# Patient Record
Sex: Male | Born: 1935 | Race: White | Hispanic: No | Marital: Married | Smoking: Former smoker
Health system: Southern US, Community
[De-identification: ages and names within clinical notes are randomized; demographics above are authoritative.]

## PROBLEM LIST (undated history)

## (undated) DIAGNOSIS — Z973 Presence of spectacles and contact lenses: Secondary | ICD-10-CM

## (undated) DIAGNOSIS — Z86010 Personal history of colonic polyps: Secondary | ICD-10-CM

## (undated) DIAGNOSIS — D62 Acute posthemorrhagic anemia: Secondary | ICD-10-CM

## (undated) DIAGNOSIS — N319 Neuromuscular dysfunction of bladder, unspecified: Secondary | ICD-10-CM

## (undated) DIAGNOSIS — Z8546 Personal history of malignant neoplasm of prostate: Secondary | ICD-10-CM

## (undated) DIAGNOSIS — Z87898 Personal history of other specified conditions: Secondary | ICD-10-CM

## (undated) DIAGNOSIS — N320 Bladder-neck obstruction: Secondary | ICD-10-CM

## (undated) DIAGNOSIS — Z8719 Personal history of other diseases of the digestive system: Secondary | ICD-10-CM

## (undated) DIAGNOSIS — K5730 Diverticulosis of large intestine without perforation or abscess without bleeding: Secondary | ICD-10-CM

## (undated) DIAGNOSIS — R310 Gross hematuria: Secondary | ICD-10-CM

## (undated) DIAGNOSIS — Z8673 Personal history of transient ischemic attack (TIA), and cerebral infarction without residual deficits: Secondary | ICD-10-CM

## (undated) DIAGNOSIS — Z960 Presence of urogenital implants: Secondary | ICD-10-CM

## (undated) DIAGNOSIS — Z789 Other specified health status: Secondary | ICD-10-CM

## (undated) DIAGNOSIS — Z87440 Personal history of urinary (tract) infections: Secondary | ICD-10-CM

## (undated) DIAGNOSIS — R339 Retention of urine, unspecified: Secondary | ICD-10-CM

## (undated) DIAGNOSIS — Z978 Presence of other specified devices: Secondary | ICD-10-CM

## (undated) HISTORY — PX: EYE SURGERY: SHX253

## (undated) HISTORY — PX: PROSTATE SURGERY: SHX751

## (undated) HISTORY — PX: OTHER SURGICAL HISTORY: SHX169

---

## 1999-04-22 ENCOUNTER — Other Ambulatory Visit: Admission: RE | Admit: 1999-04-22 | Discharge: 1999-04-22 | Payer: Self-pay | Admitting: Urology

## 2001-08-31 ENCOUNTER — Encounter: Payer: Self-pay | Admitting: Urology

## 2001-09-06 ENCOUNTER — Encounter (INDEPENDENT_AMBULATORY_CARE_PROVIDER_SITE_OTHER): Payer: Self-pay

## 2001-09-06 ENCOUNTER — Inpatient Hospital Stay (HOSPITAL_COMMUNITY): Admission: RE | Admit: 2001-09-06 | Discharge: 2001-09-08 | Payer: Self-pay | Admitting: Urology

## 2001-09-06 HISTORY — PX: TRANSURETHRAL RESECTION OF PROSTATE: SHX73

## 2001-09-07 ENCOUNTER — Encounter: Payer: Self-pay | Admitting: Urology

## 2001-09-26 ENCOUNTER — Ambulatory Visit: Admission: RE | Admit: 2001-09-26 | Discharge: 2001-12-25 | Payer: Self-pay | Admitting: Radiation Oncology

## 2001-11-09 DIAGNOSIS — Z8546 Personal history of malignant neoplasm of prostate: Secondary | ICD-10-CM

## 2001-11-09 HISTORY — DX: Personal history of malignant neoplasm of prostate: Z85.46

## 2001-12-28 ENCOUNTER — Ambulatory Visit: Admission: RE | Admit: 2001-12-28 | Discharge: 2002-03-28 | Payer: Self-pay | Admitting: Radiation Oncology

## 2002-03-14 ENCOUNTER — Encounter: Payer: Self-pay | Admitting: Urology

## 2002-03-21 ENCOUNTER — Encounter (INDEPENDENT_AMBULATORY_CARE_PROVIDER_SITE_OTHER): Payer: Self-pay

## 2002-03-21 ENCOUNTER — Inpatient Hospital Stay (HOSPITAL_COMMUNITY): Admission: RE | Admit: 2002-03-21 | Discharge: 2002-03-25 | Payer: Self-pay | Admitting: Urology

## 2002-06-02 ENCOUNTER — Ambulatory Visit (HOSPITAL_BASED_OUTPATIENT_CLINIC_OR_DEPARTMENT_OTHER): Admission: RE | Admit: 2002-06-02 | Discharge: 2002-06-02 | Payer: Self-pay | Admitting: Urology

## 2002-06-02 HISTORY — PX: OTHER SURGICAL HISTORY: SHX169

## 2003-04-13 ENCOUNTER — Ambulatory Visit (HOSPITAL_COMMUNITY): Admission: RE | Admit: 2003-04-13 | Discharge: 2003-04-13 | Payer: Self-pay | Admitting: Gastroenterology

## 2003-08-04 ENCOUNTER — Encounter: Payer: Self-pay | Admitting: Emergency Medicine

## 2003-08-04 ENCOUNTER — Emergency Department (HOSPITAL_COMMUNITY): Admission: EM | Admit: 2003-08-04 | Discharge: 2003-08-04 | Payer: Self-pay | Admitting: Emergency Medicine

## 2004-11-06 ENCOUNTER — Ambulatory Visit (HOSPITAL_COMMUNITY): Admission: RE | Admit: 2004-11-06 | Discharge: 2004-11-06 | Payer: Self-pay | Admitting: Allergy

## 2007-03-11 ENCOUNTER — Encounter: Admission: RE | Admit: 2007-03-11 | Discharge: 2007-03-11 | Payer: Self-pay | Admitting: Gastroenterology

## 2008-09-07 ENCOUNTER — Encounter: Admission: RE | Admit: 2008-09-07 | Discharge: 2008-09-07 | Payer: Self-pay | Admitting: Internal Medicine

## 2008-11-09 HISTORY — PX: CATARACT EXTRACTION W/ INTRAOCULAR LENS  IMPLANT, BILATERAL: SHX1307

## 2011-03-27 NOTE — Discharge Summary (Signed)
[facility]  Patient:    Trevor Phillips, Trevor Phillips Visit Number: [id number] MRN: [id number]          Service Type: SUR Location: 3W 0348 01 Attending Physician:  Tania Ade Dictated by:   Lucrezia Starch. [name], M.D. Admit Date:  09/06/2001 Discharge Date: 09/08/2001   CC:         [name], M.D.   Discharge Summary  OPERATIVE PROCEDURE:  Cysto TURP on September 06, 2001.  HISTORY AND PHYSICAL EXAMINATION:  Mr. Mehra is a very nice 77 year old white male who basically is presented in urinary retention.  He has failed the voiding trials and has subsequently undergone urodynamics which revealed markedly delayed sensation with a low caliber detrusor contraction and significant outflow obstruction.  We had a long discussion and felt that he would have a chance urinating with cysto TURP.  He understands that he may fail this, but would like to certainly give it a shot.  He understands the risks, benefits, and alternatives, including, but not limited to bleeding, infection, scarring, etc., and has elected to proceed.  For the past medical history, social history, family history, and review of systems, please see history and physical for full details.  PHYSICAL EXAMINATION:  VITAL SIGNS:  He was afebrile, vital signs stable.  GENERAL:  He was well-nourished, well-developed, in no acute distress and oriented x3.  HEENT:  Normal.  NECK:  Without masses or thyromegaly.  CHEST:  Clear anterior and posterior without rales or rhonchi.  ABDOMEN:  Soft and nontender without masses or organomegaly.  EXTREMITIES:  Normal.  NEURO:  Intact.  PENIS:  Meatus, scrotum, testicles, anus, and perineum were normal.  Rectal vault was empty.  Prostate was approximately 25-30 g and appeared to benign to palpation.  HOSPITAL COURSE:  After undergoing a proper preoperative evaluation, which included full laboratory work, and which of note he had elevated  liver enzymes, but only mildly elevated liver enzymes and bilirubin.  He was subsequently taken to surgery and underwent cysto TURP, uneventfully. Postoperatively, immediately, that evening, his hematocrit was 37.8, hemoglobin 13.2, white blood cell count was 6.6, and Chem-7 was essentially normal for a glucose of 137.  I ask Dr. Vida Rigger to evaluate him due to the elevated liver enzymes and he felt they were very minimally elevated, possibly due to one of the drugs.  He felt that an ultrasound to rule out structural abnormalities were probably indicated.  By September 07, 2001, he was doing well.  Foley catheter was clear and he was to have the catheter out in the morning.  The ultrasound was essentially negative and hepatitis profiles, etc., were ordered and were to be followed up in Dr. Adriana Simas office.  He was subsequently discharged on September 08, 2001. He will have a catheter as needed.  He did begin urinating.  CONDITION ON DISCHARGE:  Improved.  MEDICATIONS:  Keflex.  FOLLOWUP:  He was to follow up in two weeks for evaluation.  Instructions were given.  Of note, his pathology report did reveal the stage IA adenocarcinoma, Gleason score 6.  This was discussed with him in some detail and will be followed up in the office. Dictated by:   Lucrezia Starch. [name], M.D. Attending Physician:  Tania Ade DD:  09/27/01 TD:  09/27/01 Job: [phone]

## 2011-03-27 NOTE — Consult Note (Signed)
[facility]  Patient:    Trevor, Phillips Visit Number: [id number] MRN: [id number]          Service Type: SUR Location: 3W 0348 01 Attending Physician:  Tania Ade Proc. Date: 09/06/01 Admit Date:  09/06/2001   CC:         Trevor Phillips, M.D.  [name], M.D.  [name], M.D.   Consultation Report  HISTORY OF PRESENT ILLNESS:  This patient is well-known to Dr. [name], [name], and Sherin Quarry and I am asked to see him by Dr. [name] for elevated liver tests.  He has no history of those and has no GI problems.  He has donated blood multiple times in the past including three months ago and has never gotten a letter back.  However, his preoperative and labs this morning were minimally elevated and I am consulted for further workup and plans.  He has seen Dr. Sherin Quarry before for colonoscopies and initially Dr. Sherin Quarry was contacted, but I was oncall for him today and asked to see the patient.  PAST MEDICAL HISTORY:  Pertinent for urinary retention and had a TURP today, but no other chronic medical problems except for allergies.  He does currently have a cold.  MEDICATIONS:  Antibiotics, both Cipro and Cephalexin.  Flomax and Allegra. He does take some vitamins and some health food store additives.  He drinks rarely and does not smoke.  FAMILY HISTORY:  Negative for any liver disease.  REVIEW OF SYSTEMS:  Pertinent for he is never noticed with a cold or virus having increased yellow eyes or dark urine.  He has not lost any weight or had any other complaints or problems.  PHYSICAL EXAMINATION:  GENERAL:  No acute distress.  Looks well postoperatively.  VITAL SIGNS:  Stable, afebrile.  HEENT:  Sclerae nonicteric.  NECK:  Supple without obvious adenopathy.  LUNGS:  Clear.  HEART:  Regular rate and rhythm.  ABDOMEN:  Soft and nontender.  Good bowel sounds.  No obvious organomegaly.  CBC normal.   Platelet count 168 with an MCV of 92.  Chemistries are pertinent for bilirubin of 2.0, but indirect of 1.9, alkaline phosphatase 69, SGOT 70, SGPT 101, albumin 3.2, total protein 6.8, PT 14.7, PTT 28.  ASSESSMENT:  Very minimally elevated liver tests with indirect bilirubin mostly, possibly all secondary to his recent cold and virus, possibly does have mild ______ as well.  Possibly one of the drugs is elevating his liver tests a little.  With his history of donating blood and the last one being three months ago without any letters getting back, you can probably rule out any chronic liver disease based on that history.  PLAN:  I will go ahead and get an ultrasound to rule out any structural abnormalities.  I believe the patient can be worked up as an outpatient unless liver tests increase significantly or unless significant finding on the ultrasound.  Either myself or Dr. Sherin Quarry will see tomorrow.  We can check Dr. Lesia Sago chart to see if we can compare old liver tests and certainly they could follow this since they are familiar with the patient, but I would be happy to assist in any way.  Reassurance was given to the patient and the family.  Possibly we should just stop his multivitamins and herbs and see how the labs do.  Thank you very much for the consultation. Attending Physician:  Tania Ade DD:  09/06/01  TD:  09/07/01 Job: [id number]

## 2011-03-27 NOTE — Op Note (Signed)
   NAME:  Trevor Phillips, Trevor Phillips                          ACCOUNT NO.:  [id number]   MEDICAL RECORD NO.:  [id number]                   PATIENT TYPE:  AMB   LOCATION:  ENDO                                 FACILITY:  [facility]   PHYSICIAN:  Danise Edge, M.D.                DATE OF BIRTH:  03-Jul-1931   DATE OF PROCEDURE:  04/13/2003  DATE OF DISCHARGE:                                 OPERATIVE REPORT   PROCEDURE PERFORMED:  Screening colonoscopy.   ENDOSCOPIST:  Danise Edge, M.D.   PROCEDURE INDICATION:  Trevor Phillips is a 77 year old male born November 20, 1930.  Trevor Phillips is scheduled to undergo his third screening colonoscopy  with polypectomy to prevent colon cancer.  He has undergone colonoscopic  exams in the past to remove neoplastic, but noncancerous colon polyps.   PREMEDICATION:  Versed 5 mg and Demerol 50 mg.   DESCRIPTION OF PROCEDURE:  After obtaining informed consent Trevor Phillips was  placed in the left lateral decubitus position.  I administered intravenous  Demerol and intravenous Versed to achieve conscious sedation for the  procedure.  The patient's blood pressure, oxygen saturation and cardiac  rhythm were monitored throughout the procedure, and documented in the  medical record.   Anal inspection was normal.  Digital rectal exam revealed an absent prostate  from prostate cancer therapy.   The Olympus pediatric adjustable colonoscope was introduced into the rectum  and easily advanced to the cecum.  A normal-appearing ileocecal valve was  intubated and the distal ileum inspected.  Colonic preparation for the  examine today was excellent.   Rectum:  In the distal rectum a 1 mm diminutive polyp was removed with the  electrocautery snare, but no pathology was retrieved.   Sigmoid Colon and Descending Colon:  Left colonic diverticulosis.   Splenic Flexure:  Normal.   Transverse Colon:  Normal.   Hepatic Flexure:  Normal.   Ascending Colon:  Normal.   Cecum and  Ileocecal Valve:  Normal.   Distal Ileum:  Normal.   ASSESSMENT:  1. Left colonic diverticulosis.  2. A diminutive polyp was removed from the distal rectum with electrocautery     snare, but pathology was not retrieved.   RECOMMENDATIONS:  Repeat colonoscopy in five years.                                                 Danise Edge, M.D.    MJ/MEDQ  D:  04/13/2003  T:  04/14/2003  Job:  [id number]   cc:   [name], M.D.  [address] [place], [place] [address]  Fax: [phone]

## 2011-03-27 NOTE — Op Note (Signed)
[facility]  Patient:    Trevor Phillips, SCHOLZ Visit Number: [id number] MRN: [id number]          Service Type: NES Location: NESC Attending Physician:  Tania Ade Dictated by:   Lucrezia Starch. [name], M.D. Proc. 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  OPERATION PERFORMED:  Cysto, dilatation of bladder neck contracture with Heymann dilators and placement of Foley catheter.  SURGEON:  Lucrezia Starch. [name], M.D.  ANESTHESIA:  General laryngeal airways.  ESTIMATED BLOOD LOSS:  Negligible.  TUBES:  20 [place] coude catheter.  COMPLICATIONS:  None.  INDICATIONS FOR PROCEDURE:  [name] is a very nice 77 year old white male who is status post radical retropubic prostatectomy in May of 2003. He had a small rectal injury which was repaired. He was noted also to have a very hypocontractile bladder and also he has been urinating well his urine flow has slowed down. We were afraid with the hypocontractile bladder there has been narrowing of the bladder neck. Due to the previous rectal injury which now appears to be healed, we were reluctant to manipulate it in the office. After understanding the risks, benefits, and alternatives, we elected to proceed with the above procedure.  DESCRIPTION OF PROCEDURE:  The patient was placed in supine position after proper general laryngeal airway anesthesia was placed in the dorsal lithotomy position and prepped and draped with Betadine in a sterile fashion. Cystoscopy was performed with a 17 [place] panendoscope. The external sphincter was noted to be intact but just proximal to that it was slightly narrow and under direct vision a 0.038 French Teflon coated guidewire was  placed into the bladder. The cystoscope was removed and utilizing Heymann dilators over the guidewire, the bladder neck was dilated with eased 22 [place] and the wire was maintained. Cystourethroscopy was then performed, the bladder neck was noted to be widely patent at this point. Efflux of clear urine was noted bilaterally and the bladder was noted to be intact although it was quite distended and thin walled hypocontractile. The bladder was drained, the panendoscope was removed. A 20 French 10 cc balloon coude catheter was passed and the bladder was drained. The guidewire was removed and the patient was taken to the recovery room stable. The plan is maintain the catheter for approximately two weeks and then teach him intermittent catheterization at that point. Dictated by:   Lucrezia Starch. [name], M.D. Attending Physician:  Tania Ade DD:  06/02/02 TD:  06/06/02 Job: [phone] [id number]

## 2011-03-27 NOTE — H&P (Signed)
[facility]  Patient:    Trevor Phillips, Trevor Phillips Visit Number: [id number] MRN: [id number]          Service Type: SUR Attending Physician:  Esaw Dace Md Dictated by:   Lucrezia Starch. [name], M.D. Admit Date:  03/21/2002 Discharge Date: 03/25/2002                           History and Physical  CHIEF COMPLAINT:  "I have prostate cancer."  HISTORY OF PRESENT ILLNESS:  [name] is a very nice 77 year old white male who initially developed urinary retention and failed multiple voiding trials and urodynamics revealed marked delayed sensation and low caliber detrusor contractions and elected to undergo cystoscopy and transurethral resectionof the prostate on September 06, 2002.  Pathology revealed Gleason score 6which is possibly A1 adenocarcinoma of the prostate.  Subsequent transrectal ultrasound and biopsy of the prostate revealed a Gleason score 7 which was 4+3 adenocarcinoma involving 10% of the tissue from the left side of the prostate. He has considered all options carefully including reading Dr. Laurell Roof book and has elected to proceed with radical retropubic prostatectomy.  ALLERGIES:  No known drug allergies.  MEDICATIONS: Allegra, Flomax, multivitamins.  PAST MEDICAL HISTORY:  Illnesses:  He has undergone TUNA, TURP, vasectomy, right eye surgery years ago, but otherwise essentially negative.  SOCIAL HISTORY:  He has an occasionaldrink.  He smoked one pack per day cigarettes for eight years and quit in 1958.  FAMILY HISTORY:  Noncontributory.  REVIEW OF SYSTEMS: He has no shortness of breath, dyspnea on exertion, GI complaints.  He does occasionally have hesitancy of urination.  PHYSICAL EXAMINATION  VITAL SIGNS:  Blood pressure 110/58, pulse 68, respirations 16, temperature 96.6 degrees Fahrenheit.  GENERAL:  He is well-nourished, well-developed, thin, in no acute distress. Oriented x3.  HEENT: PERRLA.  Nose, throat clear.  NECK:   Without mass or thyromegaly.  CHEST:  Clear anterior and posterior without rales or rhonchi.  HEART:  Normal sinus rhythm without murmurs or gallops.  ABDOMEN:  Soft, nontender.  No mass or organomegaly.  EXTREMITIES:  Normal.  NEUROLOGIC:  Intact.  SKIN:  Normal.  GENITOURINARY:  Penis, meatus, scrotum, testicle, adnexa, anus, and perineum are normal.  RECTAL:  Rectal vault is empty.  Prostate is approximately 20-25 g, smooth, and firm.  IMPRESSION:  Prostate cancer.  PLAN:  Radical retropubic prostatectomy. Dictated by:   Lucrezia Starch. [name], M.D. Attending Physician:  Esaw Dace Md DD:  04/19/02 TD:  04/21/02 Job: [id number]

## 2011-03-27 NOTE — Op Note (Signed)
[facility]  Patient:    Trevor Phillips, Trevor Phillips Visit Number: [id number] MRN: [id number]          Service Type: SUR Location: 3W 0348 01 Attending Physician:  Tania Ade Dictated by:   Lucrezia Starch. [name], M.D. Proc. Date: 09/06/01 Admit Date:  09/06/2001                             Operative Report  PREOPERATIVE DIAGNOSIS:  Urinary retention, benign prostatic hypertrophy.  POSTOPERATIVE DIAGNOSIS:  Urinary retention, benign prostatic hypertrophy.  OPERATION PERFORMED:  Cystoscopy and transurethral resection of the prostate.  SURGEON:  Lucrezia Starch. [name], M.D.  ANESTHESIA:  Spinal.  ESTIMATED BLOOD LOSS:  75 cc.  DRAINS:  24 French 30 cc balloon Ainsworth Foley catheter.  COMPLICATIONS:  None.  INDICATIONS FOR PROCEDURE:  Mr. Mcfarren is a very nice 77 year old white male who has developed a urinary retention.  He has been on self intermittent catheterization and has undergone multiple voiding trials despite being on anticholinergics has failed.  He has undergone full urodynamics which revealed a markedly delayed sensation of contraction and low caliber detrusor contraction with outflow obstruction. We have considered all options of minimally invasive procedures, continue intermittent catheterization, tube drainage, etc. and after understanding risks, benefits and alternatives, he has elected to proceed with transurethral resection of the prostate.  He knows he very well may not urinate after this but this will increase his chances of urinating.  He knows he may have to continue intermittent catheterization.  He understands bleeding, infection, retrograde ejaculation, sexual dysfunction, etc. and wishes to proceed.  DESCRIPTION OF PROCEDURE:  The patient was placed in supine position after proper spinal anesthesia, he was placed in dorsal lithotomy position and prepped and draped with Betadine in sterile fashion.  The urethra  was calibrated to 30 [place] with [employer] sounds and a 28 French Olympus continuous flow resectoscope sheath was placed over a Chartered loss adjuster and the bladder was carefully inspected.  It was noted to be smooth-walled. Efflux of clear urine was noted from normally placed ureteral orifices bilaterally and there was a bilobar hypertrophy with a narrow but obstructed prostate.  I utilized the working element of the 12 degree lens. Transurethral resection of the prostate was begun at the bladder neck and extended to the verumontanum.  Resection was carried down to the surgical capsule.  The left lobe of the prostate was then serially resected to the surgical capsule as was the right lobe.  Apical tissue was then approached and resected 360 degrees carefully avoid verumontanum and external sphincter muscle.  The overhanging tissue in the 12 oclock position was serially resected again to the surgical capsule.  Reinspection revealed no significant perforations were noted.  There was no remaining adenoma.  Chips were evacuated with Toomey syringe and reinspection revealed that there were no chips within the bladder.  Good hemostasis was noted to be present.  The resectoscope sheath was visually removed and a 24 French 30 cc balloon Ainsworth catheter was passed into the bladder and the bladder was noted to irrigate clear.  Chips were submitted to pathology.  The patient tolerated the procedure well and was taken to the recovery room in stable condition. Dictated by:   Lucrezia Starch. [name], M.D. Attending Physician:  Tania Ade DD:  09/06/01 TD:  09/06/01 Job: [id number]

## 2011-03-27 NOTE — Consult Note (Signed)
[facility]  Patient:    CANDLER, GINSBERG Visit Number: [id number] MRN: [id number]          Service Type: SUR Location: 3W 0352 01 Attending Physician:  Tania Ade Dictated by:   Adolph Pollack, M.D. Admit Date:  03/21/2002   CC:         [name], M.D.   Consultation Report  REQUESTING PHYSICIAN:  Lucrezia Starch. [name], M.D.  REASON:  Rectal perforation.  HISTORY OF PRESENT ILLNESS:  Mr. Cowin is a 77 year old man with prostate cancer.  He has had previous prostate biopsies as well as a TURP.  He was undergoing a radical prostatectomy an in separating the prostate gland from the rectum a small perforation was made in the anterior aspect of the rectum. I was asked to come inspect the area intraoperatively.  Examining the area demonstrate a 1-1.5 cm perforation in the anterior aspect of the rectum just to the left.  The edges are clean and viable.  No gross contamination is noted (patient had a mechanical bowel prep preoperatively ordered by Dr. [name]).  IMPRESSION:  Rectal perforation.  PLAN: 1. Primary closure.  I do not think diverting colostomy is necessary as the    edges are viable and there is no obvious gross contamination.  There is    also minimal blood loss. 2. Seventy-two hours of intravenous antibiotics. 3. Drain area. 4. No rectal medicines or temperatures. Dictated by:   Adolph Pollack, M.D. Attending Physician:  Tania Ade DD:  03/21/02 TD:  03/24/02 Job: [id number]

## 2011-03-27 NOTE — Op Note (Signed)
[facility]  Patient:    Trevor Phillips, Trevor Phillips Visit Number: [id number] MRN: [id number]          Service Type: SUR Location: 3W 0352 01 Attending Physician:  Tania Ade Dictated by:   Lucrezia Starch. [name], M.D. Proc. Date: 03/21/02 Admit Date:  03/21/2002                             Operative Report  9PREOPERATIVE DIAGNOSIS:  Adenocarcinoma of the prostate.  POSTOPERATIVE DIAGNOSIS:  Adenocarcinoma of the prostate.  OPERATION PERFORMED:  Radical retropubic prostatectomy.  SURGEON:  Lucrezia Starch. [name], M.D.  ASSISTANT:  Excell Seltzer. Annabell Howells, M.D.  ANESTHESIA:  General endotracheal.  ESTIMATED BLOOD LOSS:  125 cc.  TUBES:  22 [place] Foley, large flat Blake drain.  COMPLICATIONS:  A small rectal injury was encountered and repaired primarily by Dr. Avel Peace with Dr. Annabell Howells assisting.  DISPOSITION:  To recovery room stable.  INDICATIONS FOR PROCEDURE:  Trevor Phillips is a very nice 77 year old white male who initially developed urinary retention and failed multiple voiding trials. He underwent urodynamics which revealed markedly delayed sensation and low caliber contusion contractions and elected to undergo Cysto TURP on September 06, 2001. The pathology revealed a Gleason score of 6 possible stage A1 adenocarcinoma of the prostate. Subsequent transrectal ultrasound and biopsy of the prostate revealed, however, a Gleason score of 7 which was 4 + 3 adenocarcinoma involving 10% of the tissue from the left side of the prostate. He has considered all options very carefully and has read Dr. Laurine Blazer books, spent multiple sessions with me and with his family and has also had a second opinion with Dr. Kathrin Greathouse a radiation oncologist. He realizes he may not urinate well and he also realizes that having had the TURP and the biopsy, the rectal injury rate has increased, incontinence and impotence rate is increased. We discussed all of this in great  detail and after understanding the risks, benefits, and alternatives and being very well informed, he has elected to proceed with radical retropubic prostatectomy.  DESCRIPTION OF PROCEDURE:  The patient was placed in the supine position after proper general endotracheal anesthesia was prepped and draped with Betadine in a sterile fashion. He was initially catheterized and noted to have 1300 cc in his bladder. He does catheterize himself every few days and he has a known hypocontractile bladder. Understanding that he may not urinate, he still wanted to proceed with radical retropubic prostatectomy. A lower midline vertical abdominal incision was made, sharp dissection carried down to the subcutaneous tissue. The rectus muscle bellies were retracted laterally and the space of Retzius was entered. A Bookwalter retractor was placed and bilateral pelvic lymphadenectomy was performed. The obturator, hypogastric and external iliac lymph nodes were dissected free and submitted to pathology. External lymph nodes were avoided. On the distal margin of the resection was the circumflex iliac vein and on the proximal margin was the ureter. Next, the prostate was approached and was noted to be quite small and somewhat adherent from prior TURP. The endopelvic fascia was perforated bilaterally. The lateral pelvic fascia was taken down bilaterally. The puboprostatic ligaments were sharply incised. The superficial dorsal vein to the penis was ligated proximally and distally with 3-0 chromic catgut and the deep dorsal vein in the penis was encircled with a Hohenfellner retractor and ligated with #1 Vicryl suture. It was cut sharply with Bovie coagulation cautery. The urethra  was identified. The pillars of the prostate were taken down laterally. The urethra was then encircled with a right angle and an umbilical tape was placed. The anterior urethra was incised sharply with the knife. The catheter was  delivered into the operative field and the posterior urethra was incised sharply with scissors to the umbilical tape. The rectourethralis muscle was noted to be highly attenuated, densely adherent and the rectum was thin and densely adherent to the posterior prostate. Denonvilliers fascia was not well defined and there was a positive margin in this region. The rectum was then dissected to the transversalis fascia and while tension was being held on the prostate, a small rent appeared laterally in the rectum and was approximately 1 cm in length. There was absolutely no contamination noted. He had had an excellent mechanical bowel prep preoperatively and Dr. Avel Peace was asked in at that point. It was felt that dissection should be performed before the repair was performed, therefore, the lateral pedicles were taken and clipped with hemlock clips and the seminal vesicle and ampulla vas deferens were dissected down, clipped and incised with the specimen. At that point, Dr. Abbey Chatters entered the operating room and repaired the rectum and it will be dictated separately. He repaired it in three layers, had excellent closure and coverage and he felt due to essentially no contamination, a very small injury, well vascularized tissues, again no contamination that temporary colostomy was probably not necessary. An ampule of indigo carmine was given IV. The prostate was incised at the bladder neck area. A large TUR defect was noted. This was taken down posteriorly and the specimen was removed and submitted to pathology. Thorough was irrigation was performed and good hemostasis noted to be present. The mucosa of the bladder was approximated to the serosa with interrupted 5-0 chromic catgut and a tennis racquet type closure was performed with 3-0 chromic catgut followed by secondary layers covering the suture line with well vascularized posterior tissue creating a second layer over the bladder  suture line. A good bladder neck that was well mucosalized was noted to be present and efflux of blue urine was noted from the ureteral orifices.  The urethral anastomosis was performed with a 22 French 10 cc balloon catheter with 15 cc in the balloon. Stitches were placed in the 5 oclock, 7 oclock, 3 oclock, 9 oclock and 12 oclock positions and were strictly stitched to the urethra well away from the rectal repair. Again multiple layers of vascular tissues were noted between the urethral anastomosis and the rectal repair and the rectal repair was well away from the anastomosis. A #1 Prolene safety stitch was attached to the islet, exited through the bladder and tied over a button. The bladder was irrigated thoroughly and there was absolutely no leakage. It was water tight. The rectum had been distended previously and there was absolutely no leakage and again we were comfortable that no further repair was necessary or further flaps and certainly not a temporary colostomy in this case. Thorough irrigation had been performed previously with antibiotic solution and again thorough irrigation was performed with multiple GU syringes of antibiotic solution. A large flat Blake drain was placed through a left stab incision and sutured in place with silk. The rectus muscle bellies were approximated in the midline with interrupted #0 chromic catgut. The fascia was closed with running #1 PDS suture, skin was closed with skin staples. The patient was taken to the recovery room stable. Dictated by:   Windy Fast  Arby Barrette, M.D. Attending Physician:  Tania Ade DD:  03/21/02 TD:  03/21/02 Job: [id number]

## 2011-03-27 NOTE — Op Note (Signed)
[facility]  Patient:    Trevor Phillips, Trevor Phillips Visit Number: [id number] MRN: [id number]          Service Type: SUR Location: 3W 0352 01 Attending Physician:  Tania Ade Dictated by:   Adolph Pollack, M.D. Proc. Date: 03/21/02 Admit Date:  03/21/2002   CC:         [name], M.D.   Operative Report  PREOPERATIVE DIAGNOSIS:  Prostate cancer.  POSTOPERATIVE DIAGNOSIS:  Prostate cancer with rectal perforation.  PROCEDURE:  Primary repair of rectal perforation.  SURGEON:  Adolph Pollack, M.D.  ASSISTANT:  Excell Seltzer. Annabell Howells, M.D.  ANESTHESIA:  General.  INDICATION:  This 77 year old male was undergoing radical retropubic prostatectomy.  Separating the prostate gland from the rectum resulted in a small rectal perforation.  The patient had a preoperative mechanical bowel prep.  On examining, I was subsequently called in the operating room to assess this.  TECHNIQUE:  In evaluating the area, there was a 1-1.5 cm perforation in the anterior aspect of the rectum to the left side.  The edges were clean and viable.  No gross contamination was noted.  The prostate had been retracted superiorly.  I subsequently closed the perforation initially with a running 3-0 Vicryl suture.  I then used 3-0 silk sutures to embrocate the area in a Lembert type fashion.  Following this, I closed peritoneum over the repair with a running 3-0 Vicryl suture.  I closed this in layers to try to minimize a future rectourethral fistula.  Next, the area was irrigated, and the repair was under no tension and appeared adequate.  I subsequently then performed a digital rectal examination.  The area proximal to the repair was occluded, and then air was insufflated into the rectum, and the pelvis filled up with saline.  No evidence of leak was noted.  The plan will be to keep him on 72 hours of IV antibiotics.  Avoid rectal temperatures or medications.  We will  start out with a liquid diet, start a stool softener tomorrow. Dictated by:   Adolph Pollack, M.D. Attending Physician:  Tania Ade DD:  03/21/02 TD:  03/22/02 Job: [id number]

## 2011-03-27 NOTE — Discharge Summary (Signed)
[facility]  Patient:    Trevor Phillips, Trevor Phillips Visit Number: [id number] MRN: [id number]          Service Type: SUR Attending Physician:  Esaw Dace Md Dictated by:   Lucrezia Starch. [name], M.D. Admit Date:  03/21/2002 Discharge Date: 03/25/2002                             Discharge Summary  DIAGNOSIS:  Adenocarcinoma of the prostate.  OPERATIVE PROCEDURE:  Radical retropubic prostatectomy on Mar 21, 2002 and primary repair of rectum on Mar 21, 2002 by Dr. Avel Peace.  HISTORY OF PRESENT ILLNESS:  The patient is a very nice 77 year old white male who developed urinary retention, failed voiding trials, and underwent TURP. He was found to have a small focus of low-grade adenocarcinoma of the prostate but on rebiopsy there was found to be a Gleason score VII which was 3+4 adenocarcinoma in 10% of the tissue from the left side of the prostate. After understanding risks, benefits, and alternatives he elected to undergo radical retropubic prostatectomy.  PAST MEDICAL HISTORY/SOCIAL HISTORY/FAMILY HISTORY/REVIEW OF SYSTEMS:  Please see history and physical examination for full details.  PHYSICAL EXAMINATION:  VITAL SIGNS:  He is afebrile;, vital signs stable.  GENERAL:  He is well nourished, well developed, thin, in no acute distress, oriented x3.  HEAD/EYES/EARS/NOSE AND THROAT:  PERRLA.  Ears, nose and throat clear.  NECK:  Without masses or thyromegaly.  CHEST:  Clear anterior and posterior without rales or rhonchi.  ABDOMEN:  Soft, nontender,without mass or organomegaly.  EXTREMITIES:  Normal.  NEUROLOGIC:  Intact.  SKIN:  Normal.  GU:  Penis, meatus, scrotum, testicle, adnexa, anus, and perineum are normal.  RECTAL:  Prostateis 20 g, firm, and dormant.  HOSPITAL COURSE:  The patient was admitted and after undergoing proper preoperative evaluation was subsequently taken to surgery on Mar 21, 2002.  In surgery there was a  small clean rectal injury and Dr. Avel Peace was asked at surgery and repaired it primarily.  Immediately postoperatively he was doing well and by postop day#1, Mar 22, 2002, he was afebrile, hemoglobin was 11.0, hematocrit 31.8,white blood cell count was 9.9, and BMET was essentially normal and the Turner output was essentially low.  By postop day #2 his T-max was 99 degrees Fahrenheit, he was ambulatory, the [facility] output was low, the morphinePCA was discontinued and full liquid diet as tolerated was advanced withthe good graces of Dr. Abbey Chatters.  By Mar 24, 2002, he continued to do well, he tolerated a diet, urine was clear, flatus and bowel movements were noted and he was subsequently discharged on Mar 25, 2002 with the catheter in place, the drain was removed by Dr. Abbey Chatters, the wound was clear.  He was to follow up with me the following week for staple removal and catheter management.  DISCHARGE CONDITION:  Improved.  DISCHARGE INSTRUCTIONS:  Instructions were given.  FINAL PATHOLOGY:  A Gleason score VII which was 3+4 adenocarcinoma clinical PT2C, PN0, PMX.  This was discussed and understood. Dictated by:   Lucrezia Starch. [name], M.D. Attending Physician:  Esaw Dace Md DD:  04/19/02 TD:  04/21/02 Job: [id number]

## 2011-09-10 ENCOUNTER — Other Ambulatory Visit: Payer: Self-pay | Admitting: Dermatology

## 2012-05-18 ENCOUNTER — Emergency Department (HOSPITAL_BASED_OUTPATIENT_CLINIC_OR_DEPARTMENT_OTHER): Payer: No Typology Code available for payment source

## 2012-05-18 ENCOUNTER — Encounter (HOSPITAL_BASED_OUTPATIENT_CLINIC_OR_DEPARTMENT_OTHER): Payer: Self-pay | Admitting: Emergency Medicine

## 2012-05-18 ENCOUNTER — Emergency Department (HOSPITAL_BASED_OUTPATIENT_CLINIC_OR_DEPARTMENT_OTHER)
Admission: EM | Admit: 2012-05-18 | Discharge: 2012-05-18 | Disposition: A | Discharge status: Home / Self Care | Payer: No Typology Code available for payment source | Attending: Emergency Medicine | Admitting: Emergency Medicine

## 2012-05-18 DIAGNOSIS — Z8546 Personal history of malignant neoplasm of prostate: Secondary | ICD-10-CM | POA: Insufficient documentation

## 2012-05-18 DIAGNOSIS — S50812A Abrasion of left forearm, initial encounter: Secondary | ICD-10-CM

## 2012-05-18 DIAGNOSIS — M542 Cervicalgia: Secondary | ICD-10-CM | POA: Insufficient documentation

## 2012-05-18 DIAGNOSIS — M25579 Pain in unspecified ankle and joints of unspecified foot: Secondary | ICD-10-CM | POA: Insufficient documentation

## 2012-05-18 DIAGNOSIS — IMO0002 Reserved for concepts with insufficient information to code with codable children: Secondary | ICD-10-CM | POA: Insufficient documentation

## 2012-05-18 DIAGNOSIS — R079 Chest pain, unspecified: Secondary | ICD-10-CM | POA: Insufficient documentation

## 2012-05-18 DIAGNOSIS — R51 Headache: Secondary | ICD-10-CM | POA: Insufficient documentation

## 2012-05-18 DIAGNOSIS — S82402A Unspecified fracture of shaft of left fibula, initial encounter for closed fracture: Secondary | ICD-10-CM

## 2012-05-18 MED ORDER — HYDROCODONE-ACETAMINOPHEN 5-325 MG PO TABS: 1.0000 | ORAL_TABLET | ORAL | Status: AC | PRN
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
## 2012-05-18 NOTE — ED Notes (Signed)
EDPA back in to talk with pt r/t results and f/u

## 2012-05-18 NOTE — ED Notes (Signed)
Bruising now noted to right forehead-pt denies pain to area/nontender to touch-denies LOC

## 2012-05-20 NOTE — ED Provider Notes (Signed)
Medical screening examination/treatment/procedure(s) were conducted as a shared visit with non-physician practitioner(s) and myself.  I personally evaluated the patient during the encounter   Rolan Bucco, MD 05/20/12 [phone]

## 2012-05-31 ENCOUNTER — Other Ambulatory Visit: Payer: Self-pay | Admitting: Geriatric Medicine

## 2012-05-31 DIAGNOSIS — R9089 Other abnormal findings on diagnostic imaging of central nervous system: Secondary | ICD-10-CM

## 2012-06-02 ENCOUNTER — Ambulatory Visit
Admission: RE | Admit: 2012-06-02 | Discharge: 2012-06-02 | Disposition: A | Discharge status: Home / Self Care | Payer: Medicare Other | Source: Ambulatory Visit | Attending: Geriatric Medicine | Admitting: Geriatric Medicine

## 2012-06-02 DIAGNOSIS — R9089 Other abnormal findings on diagnostic imaging of central nervous system: Secondary | ICD-10-CM

## 2013-03-16 ENCOUNTER — Other Ambulatory Visit: Payer: Self-pay | Admitting: Dermatology

## 2013-09-06 ENCOUNTER — Other Ambulatory Visit: Payer: Self-pay | Admitting: Geriatric Medicine

## 2013-09-07 ENCOUNTER — Other Ambulatory Visit: Payer: Medicare Other

## 2013-09-08 ENCOUNTER — Ambulatory Visit
Admission: RE | Admit: 2013-09-08 | Discharge: 2013-09-08 | Disposition: A | Discharge status: Home / Self Care | Payer: 59 | Source: Ambulatory Visit | Attending: Geriatric Medicine | Admitting: Geriatric Medicine

## 2013-09-11 ENCOUNTER — Other Ambulatory Visit: Payer: Medicare Other

## 2013-10-17 ENCOUNTER — Encounter (HOSPITAL_COMMUNITY): Payer: Self-pay | Admitting: Emergency Medicine

## 2013-10-17 ENCOUNTER — Emergency Department (HOSPITAL_COMMUNITY): Payer: Medicare Other

## 2013-10-17 ENCOUNTER — Emergency Department (HOSPITAL_COMMUNITY)
Admission: EM | Admit: 2013-10-17 | Discharge: 2013-10-17 | Disposition: A | Discharge status: Home / Self Care | Payer: Medicare Other | Attending: Emergency Medicine | Admitting: Emergency Medicine

## 2013-10-17 DIAGNOSIS — K5792 Diverticulitis of intestine, part unspecified, without perforation or abscess without bleeding: Secondary | ICD-10-CM

## 2013-10-17 DIAGNOSIS — Z87448 Personal history of other diseases of urinary system: Secondary | ICD-10-CM | POA: Insufficient documentation

## 2013-10-17 DIAGNOSIS — R197 Diarrhea, unspecified: Secondary | ICD-10-CM | POA: Insufficient documentation

## 2013-10-17 DIAGNOSIS — Z87891 Personal history of nicotine dependence: Secondary | ICD-10-CM | POA: Insufficient documentation

## 2013-10-17 DIAGNOSIS — Z79899 Other long term (current) drug therapy: Secondary | ICD-10-CM | POA: Insufficient documentation

## 2013-10-17 DIAGNOSIS — Z8719 Personal history of other diseases of the digestive system: Secondary | ICD-10-CM | POA: Insufficient documentation

## 2013-10-17 DIAGNOSIS — Z87440 Personal history of urinary (tract) infections: Secondary | ICD-10-CM | POA: Insufficient documentation

## 2013-10-17 DIAGNOSIS — K5732 Diverticulitis of large intestine without perforation or abscess without bleeding: Secondary | ICD-10-CM | POA: Insufficient documentation

## 2013-10-17 DIAGNOSIS — Z8546 Personal history of malignant neoplasm of prostate: Secondary | ICD-10-CM | POA: Insufficient documentation

## 2013-10-17 DIAGNOSIS — Z7982 Long term (current) use of aspirin: Secondary | ICD-10-CM | POA: Insufficient documentation

## 2013-10-17 LAB — COMPREHENSIVE METABOLIC PANEL
ALT: 20 U/L (ref 0–53)
CO2: 24 mEq/L (ref 19–32)
Calcium: 9.4 mg/dL (ref 8.4–10.5)
Creatinine, Ser: 0.86 mg/dL (ref 0.50–1.35)
GFR calc Af Amer: >90 mL/min (ref >90)
GFR calc non Af Amer: 80 mL/min — ABNORMAL LOW (ref >90)
Glucose, Bld: 89 mg/dL (ref 70–99)
Sodium: 137 mEq/L (ref 135–145)

## 2013-10-17 LAB — CBC WITH DIFFERENTIAL/PLATELET
Basophils Absolute: 0.1 10*3/uL (ref 0.0–0.1)
Basophils Relative: 1 % (ref 0–1)
Hemoglobin: 12.5 g/dL — ABNORMAL LOW (ref 13.0–17.0)
MCHC: 33.9 g/dL (ref 30.0–36.0)
Monocytes Relative: 6 % (ref 3–12)
Neutro Abs: 9.2 10*3/uL — ABNORMAL HIGH (ref 1.7–7.7)
Neutrophils Relative %: 74 % (ref 43–77)

## 2013-10-17 LAB — URINE MICROSCOPIC-ADD ON

## 2013-10-17 LAB — URINALYSIS, ROUTINE W REFLEX MICROSCOPIC
Glucose, UA: NEGATIVE mg/dL
Nitrite: NEGATIVE
Protein, ur: NEGATIVE mg/dL
Urobilinogen, UA: 0.2 mg/dL (ref 0.0–1.0)

## 2013-10-17 LAB — LIPASE, BLOOD: Lipase: 29 U/L (ref 11–59)

## 2013-10-17 LAB — CG4 I-STAT (LACTIC ACID): Lactic Acid, Venous: 0.7 mmol/L (ref 0.5–2.2)

## 2013-10-17 MED ORDER — METRONIDAZOLE 500 MG PO TABS
500.0000 mg | ORAL_TABLET | Freq: Once | ORAL | Status: AC
  Administered 2013-10-17: 500 mg via ORAL
  Filled 2013-10-17: qty 1

## 2013-10-17 MED ORDER — CIPROFLOXACIN HCL 500 MG PO TABS
500.0000 mg | ORAL_TABLET | Freq: Once | ORAL | Status: AC
  Administered 2013-10-17: 500 mg via ORAL
  Filled 2013-10-17: qty 1

## 2013-10-17 MED ORDER — METRONIDAZOLE 500 MG PO TABS: 500.0000 mg | ORAL_TABLET | Freq: Three times a day (TID) | ORAL | Status: DC

## 2013-10-17 MED ORDER — IOHEXOL 300 MG/ML  SOLN
25.0000 mL | INTRAMUSCULAR | Status: DC
  Administered 2013-10-17: 25 mL via ORAL

## 2013-10-17 MED ORDER — CIPROFLOXACIN HCL 500 MG PO TABS: 500.0000 mg | ORAL_TABLET | Freq: Two times a day (BID) | ORAL | Status: DC

## 2013-10-17 MED ORDER — IOHEXOL 300 MG/ML  SOLN
80.0000 mL | Freq: Once | INTRAMUSCULAR | Status: AC | PRN
  Administered 2013-10-17: 80 mL via INTRAVENOUS

## 2013-10-17 NOTE — ED Notes (Signed)
Pt reports left lower abdominal pain since AM. States it has been a 6/10 but after 5 loose stools pt now reports pain 4/10. Reports loose BM x "several weeks"; pt currently being seen by GI specialist and started on new medication last week to firm stool with no success. Pain on palpation to LLQ. Denies N/V.

## 2013-10-17 NOTE — ED Notes (Signed)
NOTIFIED DR. GOLDSTON OF PATIENTS LAB RESULTS OF CG4 LACTIC ACID = 0.70 mmoI/L, @19 :20PM ,10/17/2013.

## 2013-10-17 NOTE — ED Notes (Signed)
Ct made aware that pt finished oral contrast

## 2013-10-17 NOTE — ED Notes (Addendum)
Has had left abd pain and some loose bms called his GI dr and he was told to come to er . States loose but normal looking  bm  Had small amt blood in undies he states

## 2013-10-17 NOTE — ED Provider Notes (Signed)
CSN: [id number]     Arrival date & time 10/17/13  1446 History   First MD Initiated Contact with Patient 10/17/13 1752     Chief Complaint  Patient presents with  . Abdominal Pain   (Consider location/radiation/quality/duration/timing/severity/associated sxs/prior Treatment) HPI Comments: 79 yo male with 1 day of left lower abd pain. Feels like a dull achy pain. No fevers, chills or vomiting. Has had diarrhea but that is not different from his baseline. No blood in his stool or melena. Has not tried anything for the pain. Pain is currently moderate at this time, has slowly improved throughout the day. Has not noticed any bulges. No urinary sx.   Past Medical History  Diagnosis Date  . UTI (urinary tract infection)   . Urinary retention with incomplete bladder emptying   . Cancer of prostate   . Diverticular disease    Past Surgical History  Procedure Laterality Date  . Prostate surgery     No family history on file. History  Substance Use Topics  . Smoking status: Former Games developer  . Smokeless tobacco: Former Neurosurgeon  . Alcohol Use: Yes     Comment: "very little"    Review of Systems  Constitutional: Negative for fever.  Cardiovascular: Negative for chest pain.  Gastrointestinal: Positive for abdominal pain and diarrhea. Negative for vomiting and blood in stool.  Genitourinary: Negative for dysuria.  All other systems reviewed and are negative.    Allergies  Review of patient's allergies indicates no known allergies.  Home Medications   Current Outpatient Rx  Name  Route  Sig  Dispense  Refill  . aspirin EC 81 MG tablet   Oral   Take 81 mg by mouth every morning.         Marland Kitchen b complex vitamins tablet   Oral   Take 1 tablet by mouth every morning.          . Biotin 5000 MCG CAPS   Oral   Take 5,000 mcg by mouth every evening.         . cetirizine (ZYRTEC) 10 MG tablet   Oral   Take 10 mg by mouth every morning.          . Cholecalciferol (VITAMIN D3) 2000  UNITS TABS   Oral   Take 2,000 Units by mouth daily.         Marland Kitchen co-enzyme Q-10 30 MG capsule   Oral   Take 30 mg by mouth every evening.         . ezetimibe (ZETIA) 10 MG tablet   Oral   Take 10 mg by mouth every evening.          Marland Kitchen glucosamine-chondroitin 500-400 MG tablet   Oral   Take 1 tablet by mouth every evening.         . methenamine (HIPREX) 1 G tablet   Oral   Take 0.5 g by mouth 2 (two) times daily with a meal.         . Multiple Vitamins-Minerals (MULTIVITAMIN WITH MINERALS) tablet   Oral   Take 1 tablet by mouth every morning.          . Omega-3 Fatty Acids (FISH OIL PO)   Oral   Take 1 capsule by mouth 2 (two) times daily.         . polyethylene glycol (MIRALAX / GLYCOLAX) packet   Oral   Take 17 g by mouth 2 (two) times daily.         Marland Kitchen  PRESCRIPTION MEDICATION   Oral   Take 0.5 tablets by mouth 2 (two) times daily. Pink pill for bladder         . Probiotic Product (PROBIOTIC DAILY PO)   Oral   Take 1 capsule by mouth 2 (two) times daily.         . Zinc 50 MG CAPS   Oral   Take 50 mg by mouth every evening.         . ciprofloxacin (CIPRO) 500 MG tablet   Oral   Take 1 tablet (500 mg total) by mouth 2 (two) times daily. One po bid x 14 days   28 tablet   0   . metroNIDAZOLE (FLAGYL) 500 MG tablet   Oral   Take 1 tablet (500 mg total) by mouth 3 (three) times daily. X 14 days   42 tablet   0    BP 174/83  Pulse 73  Temp(Src) 97.5 F (36.4 C) (Oral)  Resp 16  SpO2 98% Physical Exam  Nursing note and vitals reviewed. Constitutional: He is oriented to person, place, and time. He appears well-developed and well-nourished. No distress.  HENT:  Head: Normocephalic and atraumatic.  Right Ear: External ear normal.  Left Ear: External ear normal.  Nose: Nose normal.  Eyes: Right eye exhibits no discharge. Left eye exhibits no discharge.  Neck: Neck supple.  Cardiovascular: Normal rate, regular rhythm, normal heart sounds  and intact distal pulses.   Pulmonary/Chest: Effort normal.  Abdominal: Soft. There is tenderness in the left lower quadrant. Hernia confirmed negative in the right inguinal area and confirmed negative in the left inguinal area.  Genitourinary: Testes normal and penis normal. Right testis shows no tenderness. Left testis shows no tenderness.  Musculoskeletal: He exhibits no edema.  Neurological: He is alert and oriented to person, place, and time.  Skin: Skin is warm and dry.    ED Course  Procedures (including critical care time) Labs Review Labs Reviewed  COMPREHENSIVE METABOLIC PANEL - Abnormal; Notable for the following:    Albumin 3.3 (*)    GFR calc non Af Amer 80 (*)    All other components within normal limits  CBC WITH DIFFERENTIAL - Abnormal; Notable for the following:    WBC 12.5 (*)    RBC 3.98 (*)    Hemoglobin 12.5 (*)    HCT 36.9 (*)    Neutro Abs 9.2 (*)    All other components within normal limits  URINALYSIS, ROUTINE W REFLEX MICROSCOPIC - Abnormal; Notable for the following:    Hgb urine dipstick SMALL (*)    Ketones, ur 15 (*)    Leukocytes, UA TRACE (*)    All other components within normal limits  URINE MICROSCOPIC-ADD ON - Abnormal; Notable for the following:    Squamous Epithelial / LPF MANY (*)    All other components within normal limits  LIPASE, BLOOD  CG4 I-STAT (LACTIC ACID)   Imaging Review Ct Abdomen Pelvis W Contrast  10/17/2013   CLINICAL DATA:  Diarrhea, left lower quadrant pain  EXAM: CT ABDOMEN AND PELVIS WITH CONTRAST  TECHNIQUE: Multidetector CT imaging of the abdomen and pelvis was performed using the standard protocol following bolus administration of intravenous contrast.  CONTRAST:  80mL OMNIPAQUE IOHEXOL 300 MG/ML  SOLN  COMPARISON:  02/11/2012  FINDINGS: Lung bases are unremarkable. Sagittal images of the spine shows diffuse osteopenia in the and degenerative changes.  Liver, spleen, pancreas and adrenals are unremarkable. Kidneys are  symmetrical in  size and enhancement. No hydronephrosis or hydroureter. No focal renal mass. Delayed renal images shows bilateral renal symmetrical excretion. Parapelvic cyst midpole of the right kidney measures 1.9 cm. Extensive atherosclerotic calcifications of abdominal aorta and iliac arteries.  Moderate stool noted in right colon and transverse colon.  There is no small bowel obstruction. No ascites. No free abdominal air.  There is abnormal thickening of the proximal sigmoid colon wall in left lower quadrant. There is stranding of surrounding fat and small amount of pericolonic fluid. Findings are highly suspicious for significant segmental colitis or diverticulitis. No pericolonic abscess is noted. There is small left inguinal scrotal canal hernia containing fat and tiny amount of fluid. Again noted distended urinary bladder without evidence of calcified calculi.  IMPRESSION: 1. There is abnormal thickening of proximal sigmoid colon wall and stranding of surrounding fat. Best visualized in coronal image 40. Small amount of pericolonic fluid. Findings are consistent with significant segmental colitis or diverticulitis. No pericolonic abscess is noted. 2. No hydronephrosis or hydroureter. Bilateral renal symmetrical excretion. 3. Extensive atherosclerotic calcifications of abdominal aorta and iliac arteries. 4. Moderate stool in proximal colon.   Electronically Signed   By: Natasha Mead M.D.   On: 10/17/2013 19:40    EKG Interpretation   None       MDM   1. Diverticulitis    Soft, mildly tender abd. Exam and CT c/w diverticulitis. Could also be some colitis, will treat same with cipro/flagyl. Patient declines pain meds in ED, and given he is not having any vomiting, fevers, or severe pain I feel he is stable for outpatient Tx.    Audree Camel, MD 10/18/13 [phone]

## 2014-09-18 ENCOUNTER — Other Ambulatory Visit: Payer: Self-pay | Admitting: Dermatology

## 2015-11-11 NOTE — ED Notes (Signed)
Patient called to ask for advice, as he c/o is bed ridden still from problems seen in ED. States his PCP called in Zofran for him today. Was advised to use zofran called in to pharmacy by his PCP, and if this does not help him to drink fluids and eat , then he should go to the ED for probable IV fluids and poss admission to hospital

## 2015-12-19 ENCOUNTER — Emergency Department (HOSPITAL_COMMUNITY)
Admission: EM | Admit: 2015-12-19 | Discharge: 2015-12-19 | Disposition: A | Discharge status: Home / Self Care | Payer: Medicare Other | Attending: Emergency Medicine | Admitting: Emergency Medicine

## 2015-12-19 ENCOUNTER — Encounter (HOSPITAL_COMMUNITY): Payer: Self-pay | Admitting: Emergency Medicine

## 2015-12-19 DIAGNOSIS — Z79899 Other long term (current) drug therapy: Secondary | ICD-10-CM | POA: Insufficient documentation

## 2015-12-19 DIAGNOSIS — N390 Urinary tract infection, site not specified: Secondary | ICD-10-CM

## 2015-12-19 DIAGNOSIS — Z8546 Personal history of malignant neoplasm of prostate: Secondary | ICD-10-CM | POA: Diagnosis not present

## 2015-12-19 DIAGNOSIS — Z87891 Personal history of nicotine dependence: Secondary | ICD-10-CM | POA: Diagnosis not present

## 2015-12-19 DIAGNOSIS — R319 Hematuria, unspecified: Secondary | ICD-10-CM

## 2015-12-19 DIAGNOSIS — Z7982 Long term (current) use of aspirin: Secondary | ICD-10-CM | POA: Diagnosis not present

## 2015-12-19 LAB — CBC WITH DIFFERENTIAL/PLATELET
Basophils Absolute: 0.1 10*3/uL (ref 0.0–0.1)
Basophils Relative: 1 %
EOS ABS: 0.6 10*3/uL (ref 0.0–0.7)
EOS PCT: 5 %
HCT: 39.7 % (ref 39.0–52.0)
Hemoglobin: 13 g/dL (ref 13.0–17.0)
Lymphocytes Relative: 28 %
Lymphs Abs: 2.9 10*3/uL (ref 0.7–4.0)
MCH: 30.4 pg (ref 26.0–34.0)
MCHC: 32.7 g/dL (ref 30.0–36.0)
MCV: 93 fL (ref 78.0–100.0)
MONOS PCT: 8 %
Monocytes Absolute: 0.9 10*3/uL (ref 0.1–1.0)
Neutro Abs: 6.1 10*3/uL (ref 1.7–7.7)
Neutrophils Relative %: 58 %
PLATELETS: 205 10*3/uL (ref 150–400)
RBC: 4.27 MIL/uL (ref 4.22–5.81)
RDW: 13.7 % (ref 11.5–15.5)
WBC: 10.6 10*3/uL — AB (ref 4.0–10.5)

## 2015-12-19 LAB — BASIC METABOLIC PANEL
Anion gap: 9 (ref 5–15)
BUN: 27 mg/dL — AB (ref 6–20)
CALCIUM: 9.6 mg/dL (ref 8.9–10.3)
CO2: 25 mmol/L (ref 22–32)
Chloride: 109 mmol/L (ref 101–111)
Creatinine, Ser: 1.17 mg/dL (ref 0.61–1.24)
GFR calc Af Amer: >60 mL/min (ref >60)
GFR, EST NON AFRICAN AMERICAN: 56 mL/min — AB (ref >60)
Glucose, Bld: 103 mg/dL — ABNORMAL HIGH (ref 65–99)
Potassium: 4.3 mmol/L (ref 3.5–5.1)
SODIUM: 143 mmol/L (ref 135–145)

## 2015-12-19 LAB — URINALYSIS, ROUTINE W REFLEX MICROSCOPIC
GLUCOSE, UA: 250 mg/dL — AB
Ketones, ur: >80 mg/dL — AB
Nitrite: POSITIVE — AB
PH: 5 (ref 5.0–8.0)
Protein, ur: >300 mg/dL — AB
Specific Gravity, Urine: 1.022 (ref 1.005–1.030)

## 2015-12-19 LAB — URINE MICROSCOPIC-ADD ON: SQUAMOUS EPITHELIAL / LPF: NONE SEEN

## 2015-12-19 LAB — PROTIME-INR
INR: 1.25 (ref 0.00–1.49)
Prothrombin Time: 15.8 seconds — ABNORMAL HIGH (ref 11.6–15.2)

## 2015-12-19 LAB — SAMPLE TO BLOOD BANK

## 2015-12-19 MED ORDER — CEPHALEXIN 250 MG PO CAPS
500.0000 mg | ORAL_CAPSULE | Freq: Once | ORAL | Status: AC
  Administered 2015-12-19: 500 mg via ORAL
  Filled 2015-12-19: qty 2

## 2015-12-19 MED ORDER — CEPHALEXIN 500 MG PO CAPS: 500.0000 mg | ORAL_CAPSULE | Freq: Four times a day (QID) | ORAL | Status: DC

## 2015-12-19 NOTE — ED Notes (Signed)
Pt self Cath with a 41fr In and Out Cath. Output 700 cc bright red blood. Per Pt, he self caths twice daily at home

## 2015-12-19 NOTE — ED Provider Notes (Signed)
CSN: [id number]     Arrival date & time 12/19/15  0235 History   First MD Initiated Contact with Patient 12/19/15 0430     Chief Complaint  Patient presents with  . Hematuria     (Consider location/radiation/quality/duration/timing/severity/associated sxs/prior Treatment) Patient is a 82 y.o. male presenting with hematuria. The history is provided by the patient.  Hematuria  He self catheterizes himself twice a day. He also will pass urine in between episodes of self catheterization. This evening, he noticed blood in the urine. When he catheterizes himself, it look like just blood. He did have some urinary urgency. He denies abdominal pain and denies fever or chills. He denies specific trauma but has been doing a lot of lifting with the splitting some word.  Past Medical History  Diagnosis Date  . UTI (urinary tract infection)   . Urinary retention with incomplete bladder emptying   . Cancer of prostate ([place])   . Diverticular disease    Past Surgical History  Procedure Laterality Date  . Prostate surgery     No family history on file. Social History  Substance Use Topics  . Smoking status: Former Research scientist (life sciences)  . Smokeless tobacco: Former Systems developer  . Alcohol Use: Yes     Comment: "very little"    Review of Systems  Genitourinary: Positive for hematuria.  All other systems reviewed and are negative.     Allergies  Review of patient's allergies indicates no known allergies.  Home Medications   Prior to Admission medications   Medication Sig Start Date End Date Taking? Authorizing Provider  aspirin EC 81 MG tablet Take 81 mg by mouth every morning.    Historical Provider, MD  b complex vitamins tablet Take 1 tablet by mouth every morning.     Historical Provider, MD  Biotin 5000 MCG CAPS Take 5,000 mcg by mouth every evening.    Historical Provider, MD  cetirizine (ZYRTEC) 10 MG tablet Take 10 mg by mouth every morning.     Historical Provider, MD  Cholecalciferol (VITAMIN D3)  2000 UNITS TABS Take 2,000 Units by mouth daily.    Historical Provider, MD  ciprofloxacin (CIPRO) 500 MG tablet Take 1 tablet (500 mg total) by mouth 2 (two) times daily. One po bid x 14 days 10/17/13   Sherwood Gambler, MD  co-enzyme Q-10 30 MG capsule Take 30 mg by mouth every evening.    Historical Provider, MD  ezetimibe (ZETIA) 10 MG tablet Take 10 mg by mouth every evening.     Historical Provider, MD  glucosamine-chondroitin 500-400 MG tablet Take 1 tablet by mouth every evening.    Historical Provider, MD  methenamine (HIPREX) 1 G tablet Take 0.5 g by mouth 2 (two) times daily with a meal.    Historical Provider, MD  metroNIDAZOLE (FLAGYL) 500 MG tablet Take 1 tablet (500 mg total) by mouth 3 (three) times daily. X 14 days 10/17/13   Sherwood Gambler, MD  Multiple Vitamins-Minerals (MULTIVITAMIN WITH MINERALS) tablet Take 1 tablet by mouth every morning.     Historical Provider, MD  Omega-3 Fatty Acids (FISH OIL PO) Take 1 capsule by mouth 2 (two) times daily.    Historical Provider, MD  polyethylene glycol (MIRALAX / GLYCOLAX) packet Take 17 g by mouth 2 (two) times daily.    Historical Provider, MD  PRESCRIPTION MEDICATION Take 0.5 tablets by mouth 2 (two) times daily. Pink pill for bladder    Historical Provider, MD  Probiotic Product (PROBIOTIC DAILY PO) Take 1  capsule by mouth 2 (two) times daily.    Historical Provider, MD  Zinc 50 MG CAPS Take 50 mg by mouth every evening.    Historical Provider, MD   BP 187/80 mmHg  Pulse 67  Temp(Src) 97.4 F (36.3 C) (Oral)  Resp 20  Ht 5\' 5"  (1.651 m)  Wt 137 lb 5 oz (62.285 kg)  BMI 22.85 kg/m2  SpO2 96% Physical Exam  Nursing note and vitals reviewed.  82 year old male, resting comfortably and in no acute distress. Vital signs are significant for hypertension. Oxygen saturation is 96%, which is normal. Head is normocephalic and atraumatic. PERRLA, EOMI. Oropharynx is clear. Neck is nontender and supple without adenopathy or JVD. Back is  nontender and there is no CVA tenderness. Lungs are clear without rales, wheezes, or rhonchi. Chest is nontender. Heart has regular rate and rhythm without murmur. Abdomen is soft, flat, nontender without masses or hepatosplenomegaly and peristalsis is normoactive. Extremities have no cyanosis or edema, full range of motion is present. Skin is warm and dry without rash. Neurologic: Mental status is normal, cranial nerves are intact, there are no motor or sensory deficits.  ED Course  Procedures (including critical care time) Labs Review Results for orders placed or performed during the hospital encounter of 12/19/15  CBC with Differential  Result Value Ref Range   WBC 10.6 (H) 4.0 - 10.5 K/uL   RBC 4.27 4.22 - 5.81 MIL/uL   Hemoglobin 13.0 13.0 - 17.0 g/dL   HCT 39.7 39.0 - 52.0 %   MCV 93.0 78.0 - 100.0 fL   MCH 30.4 26.0 - 34.0 pg   MCHC 32.7 30.0 - 36.0 g/dL   RDW 13.7 11.5 - 15.5 %   Platelets 205 150 - 400 K/uL   Neutrophils Relative % 58 %   Neutro Abs 6.1 1.7 - 7.7 K/uL   Lymphocytes Relative 28 %   Lymphs Abs 2.9 0.7 - 4.0 K/uL   Monocytes Relative 8 %   Monocytes Absolute 0.9 0.1 - 1.0 K/uL   Eosinophils Relative 5 %   Eosinophils Absolute 0.6 0.0 - 0.7 K/uL   Basophils Relative 1 %   Basophils Absolute 0.1 0.0 - 0.1 K/uL  Basic metabolic panel  Result Value Ref Range   Sodium 143 135 - 145 mmol/L   Potassium 4.3 3.5 - 5.1 mmol/L   Chloride 109 101 - 111 mmol/L   CO2 25 22 - 32 mmol/L   Glucose, Bld 103 (H) 65 - 99 mg/dL   BUN 27 (H) 6 - 20 mg/dL   Creatinine, Ser 1.17 0.61 - 1.24 mg/dL   Calcium 9.6 8.9 - 10.3 mg/dL   GFR calc non Af Amer 56 (L) >60 mL/min   GFR calc Af Amer >60 >60 mL/min   Anion gap 9 5 - 15  Protime-INR  Result Value Ref Range   Prothrombin Time 15.8 (H) 11.6 - 15.2 seconds   INR 1.25 0.00 - 1.49  Urinalysis, Routine w reflex microscopic (not at [facility])  Result Value Ref Range   Color, Urine RED (A) YELLOW   APPearance TURBID (A) CLEAR    Specific Gravity, Urine 1.022 1.005 - 1.030   pH 5.0 5.0 - 8.0   Glucose, UA 250 (A) NEGATIVE mg/dL   Hgb urine dipstick LARGE (A) NEGATIVE   Bilirubin Urine LARGE (A) NEGATIVE   Ketones, ur >80 (A) NEGATIVE mg/dL   Protein, ur >300 (A) NEGATIVE mg/dL   Nitrite POSITIVE (A) NEGATIVE  Leukocytes, UA LARGE (A) NEGATIVE  Urine microscopic-add on  Result Value Ref Range   Squamous Epithelial / LPF NONE SEEN NONE SEEN   WBC, UA TOO NUMEROUS TO COUNT 0 - 5 WBC/hpf   RBC / HPF TOO NUMEROUS TO COUNT 0 - 5 RBC/hpf   Bacteria, UA MANY (A) NONE SEEN  Sample to Blood Bank  Result Value Ref Range   Blood Bank Specimen SAMPLE AVAILABLE FOR TESTING    Sample Expiration 12/22/2015    I have personally reviewed and evaluated these lab results as part of my medical decision-making.   MDM   Final diagnoses:  Urinary tract infection with hematuria, site unspecified    Hematuria with the urinalysis strongly suggesting urinary tract infection. He is started on antibiotics. However, based on his concern of gross blood, but bladder will be irrigated in the ED. Old records are reviewed and he has no relevant past visits.  Urine cleared with bladder irrigation. I have just talked with the patient about his urine volumes and when he does his catheterization at night, he states that there is a proximally 300 mL. Because of urinary tract infection, I feel he would be better off catheterizing himself 4 times a day so that there is not urine sitting in the bladder for extended period of time. He is instructed to do this and is given prescription for cephalexin. Follow-up with his urologist in 1 week.  Delora Fuel, MD [id number]

## 2015-12-19 NOTE — Discharge Instructions (Signed)
While you are taking the antibiotic, catheterize yourself four times a day instead of twice.  Return if you develop a fever, or if bleeding is getting worse.  Urinary Tract Infection Urinary tract infections (UTIs) can develop anywhere along your urinary tract. Your urinary tract is your body's drainage system for removing wastes and extra water. Your urinary tract includes two kidneys, two ureters, a bladder, and a urethra. Your kidneys are a pair of bean-shaped organs. Each kidney is about the size of your fist. They are located below your ribs, one on each side of your spine. CAUSES Infections are caused by microbes, which are microscopic organisms, including fungi, viruses, and bacteria. These organisms are so small that they can only be seen through a microscope. Bacteria are the microbes that most commonly cause UTIs. SYMPTOMS  Symptoms of UTIs may vary by age and gender of the patient and by the location of the infection. Symptoms in young women typically include a frequent and intense urge to urinate and a painful, burning feeling in the bladder or urethra during urination. Older women and men are more likely to be tired, shaky, and weak and have muscle aches and abdominal pain. A fever may mean the infection is in your kidneys. Other symptoms of a kidney infection include pain in your back or sides below the ribs, nausea, and vomiting. DIAGNOSIS To diagnose a UTI, your caregiver will ask you about your symptoms. Your caregiver will also ask you to provide a urine sample. The urine sample will be tested for bacteria and white blood cells. White blood cells are made by your body to help fight infection. TREATMENT  Typically, UTIs can be treated with medication. Because most UTIs are caused by a bacterial infection, they usually can be treated with the use of antibiotics. The choice of antibiotic and length of treatment depend on your symptoms and the type of bacteria causing your infection. HOME  CARE INSTRUCTIONS  If you were prescribed antibiotics, take them exactly as your caregiver instructs you. Finish the medication even if you feel better after you have only taken some of the medication.  Drink enough water and fluids to keep your urine clear or pale yellow.  Avoid caffeine, tea, and carbonated beverages. They tend to irritate your bladder.  Empty your bladder often. Avoid holding urine for long periods of time.  Empty your bladder before and after sexual intercourse.  After a bowel movement, women should cleanse from front to back. Use each tissue only once. SEEK MEDICAL CARE IF:   You have back pain.  You develop a fever.  Your symptoms do not begin to resolve within 3 days. SEEK IMMEDIATE MEDICAL CARE IF:   You have severe back pain or lower abdominal pain.  You develop chills.  You have nausea or vomiting.  You have continued burning or discomfort with urination. MAKE SURE YOU:   Understand these instructions.  Will watch your condition.  Will get help right away if you are not doing well or get worse.   This information is not intended to replace advice given to you by your health care provider. Make sure you discuss any questions you have with your health care provider.   Document Released: 08/05/2005 Document Revised: 07/17/2015 Document Reviewed: 12/04/2011 Elsevier Interactive Patient Education 2016 Elsevier Inc.  Hematuria, Adult Hematuria is blood in your urine. It can be caused by a bladder infection, kidney infection, prostate infection, kidney stone, or cancer of your urinary tract. Infections can usually  be treated with medicine, and a kidney stone usually will pass through your urine. If neither of these is the cause of your hematuria, further workup to find out the reason may be needed. It is very important that you tell your health care provider about any blood you see in your urine, even if the blood stops without treatment or happens  without causing pain. Blood in your urine that happens and then stops and then happens again can be a symptom of a very serious condition. Also, pain is not a symptom in the initial stages of many urinary cancers. HOME CARE INSTRUCTIONS   Drink lots of fluid, 3-4 quarts a day. If you have been diagnosed with an infection, cranberry juice is especially recommended, in addition to large amounts of water.  Avoid caffeine, tea, and carbonated beverages because they tend to irritate the bladder.  Avoid alcohol because it may irritate the prostate.  Take all medicines as directed by your health care provider.  If you were prescribed an antibiotic medicine, finish it all even if you start to feel better.  If you have been diagnosed with a kidney stone, follow your health care provider's instructions regarding straining your urine to catch the stone.  Empty your bladder often. Avoid holding urine for long periods of time.  After a bowel movement, women should cleanse front to back. Use each tissue only once.  Empty your bladder before and after sexual intercourse if you are a male. SEEK MEDICAL CARE IF:  You develop back pain.  You have a fever.  You have a feeling of sickness in your stomach (nausea) or vomiting.  Your symptoms are not better in 3 days. Return sooner if you are getting worse. SEEK IMMEDIATE MEDICAL CARE IF:   You develop severe vomiting and are unable to keep the medicine down.  You develop severe back or abdominal pain despite taking your medicines.  You begin passing a large amount of blood or clots in your urine.  You feel extremely weak or faint, or you pass out. MAKE SURE YOU:   Understand these instructions.  Will watch your condition.  Will get help right away if you are not doing well or get worse.   This information is not intended to replace advice given to you by your health care provider. Make sure you discuss any questions you have with your  health care provider.   Document Released: 10/26/2005 Document Revised: 11/16/2014 Document Reviewed: 06/26/2013 Elsevier Interactive Patient Education 2016 [employer].  Cephalexin tablets or capsules What is this medicine? CEPHALEXIN (sef a LEX in) is a cephalosporin antibiotic. It is used to treat certain kinds of bacterial infections It will not work for colds, flu, or other viral infections. This medicine may be used for other purposes; ask your health care provider or pharmacist if you have questions. What should I tell my health care provider before I take this medicine? They need to know if you have any of these conditions: -kidney disease -stomach or intestine problems, especially colitis -an unusual or allergic reaction to cephalexin, other cephalosporins, penicillins, other antibiotics, medicines, foods, dyes or preservatives -pregnant or trying to get pregnant -breast-feeding How should I use this medicine? Take this medicine by mouth with a full glass of water. Follow the directions on the prescription label. This medicine can be taken with or without food. Take your medicine at regular intervals. Do not take your medicine more often than directed. Take all of your medicine as directed  even if you think you are better. Do not skip doses or stop your medicine early. Talk to your pediatrician regarding the use of this medicine in children. While this drug may be prescribed for selected conditions, precautions do apply. Overdosage: If you think you have taken too much of this medicine contact a poison control center or emergency room at once. NOTE: This medicine is only for you. Do not share this medicine with others. What if I miss a dose? If you miss a dose, take it as soon as you can. If it is almost time for your next dose, take only that dose. Do not take double or extra doses. There should be at least 4 to 6 hours between doses. What may interact with this  medicine? -probenecid -some other antibiotics This list may not describe all possible interactions. Give your health care provider a list of all the medicines, herbs, non-prescription drugs, or dietary supplements you use. Also tell them if you smoke, drink alcohol, or use illegal drugs. Some items may interact with your medicine. What should I watch for while using this medicine? Tell your doctor or health care professional if your symptoms do not begin to improve in a few days. Do not treat diarrhea with over the counter products. Contact your doctor if you have diarrhea that lasts more than 2 days or if it is severe and watery. If you have diabetes, you may get a false-positive result for sugar in your urine. Check with your doctor or health care professional. What side effects may I notice from receiving this medicine? Side effects that you should report to your doctor or health care professional as soon as possible: -allergic reactions like skin rash, itching or hives, swelling of the face, lips, or tongue -breathing problems -pain or trouble passing urine -redness, blistering, peeling or loosening of the skin, including inside the mouth -severe or watery diarrhea -unusually weak or tired -yellowing of the eyes, skin Side effects that usually do not require medical attention (report to your doctor or health care professional if they continue or are bothersome): -gas or heartburn -genital or anal irritation -headache -joint or muscle pain -nausea, vomiting This list may not describe all possible side effects. Call your doctor for medical advice about side effects. You may report side effects to FDA at 1-800-FDA-1088. Where should I keep my medicine? Keep out of the reach of children. Store at room temperature between 59 and 86 degrees F (15 and 30 degrees C). Throw away any unused medicine after the expiration date. NOTE: This sheet is a summary. It may not cover all possible  information. If you have questions about this medicine, talk to your doctor, pharmacist, or health care provider.    2016, Elsevier/Gold Standard. (2008-01-30 17:09:13)

## 2015-12-19 NOTE — ED Notes (Addendum)
Pt. reports hematuria onset this evening with urinary urgency , pt. self catheterize twice a day. Pt. stated he has been lifting heavy firewood yesterday.

## 2015-12-22 LAB — URINE CULTURE: Culture: >=100000

## 2015-12-23 ENCOUNTER — Telehealth (HOSPITAL_COMMUNITY): Payer: Self-pay

## 2015-12-23 NOTE — Progress Notes (Signed)
ED Antimicrobial Stewardship Positive Culture Follow Up   Trevor Phillips is an 82 y.o. male who presented to [facility] on 12/19/2015 with a chief complaint of  Chief Complaint  Patient presents with  . Hematuria    Recent Results (from the past 720 hour(s))  Urine culture     Status: None   Collection Time: 12/19/15  3:05 AM  Result Value Ref Range Status   Specimen Description URINE, CLEAN CATCH  Final   Special Requests ADDED [id number] [phone]  Final   Culture   Final    >=100,000 COLONIES/mL ESCHERICHIA COLI Confirmed Extended Spectrum Beta-Lactamase Producer (ESBL) 50,000 COLONIES/mL ENTEROCOCCUS SPECIES    Report Status 12/22/2015 FINAL  Final   Organism ID, Bacteria ESCHERICHIA COLI  Final   Organism ID, Bacteria ENTEROCOCCUS SPECIES  Final      Susceptibility   Escherichia coli - MIC*    AMPICILLIN >=32 RESISTANT Resistant     CEFAZOLIN >=64 RESISTANT Resistant     CEFTRIAXONE >=64 RESISTANT Resistant     CIPROFLOXACIN >=4 RESISTANT Resistant     GENTAMICIN <=1 SENSITIVE Sensitive     IMIPENEM <=0.25 SENSITIVE Sensitive     NITROFURANTOIN <=16 SENSITIVE Sensitive     TRIMETH/SULFA <=20 SENSITIVE Sensitive     AMPICILLIN/SULBACTAM >=32 RESISTANT Resistant     PIP/TAZO <=4 SENSITIVE Sensitive     * >=100,000 COLONIES/mL ESCHERICHIA COLI   Enterococcus species - MIC*    AMPICILLIN <=2 SENSITIVE Sensitive     LEVOFLOXACIN 2 SENSITIVE Sensitive     NITROFURANTOIN <=16 SENSITIVE Sensitive     VANCOMYCIN 1 SENSITIVE Sensitive     * 50,000 COLONIES/mL ENTEROCOCCUS SPECIES    [x]  Treated with Keflex, organism resistant and/or not covered by prescribed antimicrobial  84 YOM who self caths 2x/day who presented on 2/9 with urinary urgency and hematuria. Pt states he recently was lifting a lot of wood. Denies abdominal pain, fever, and chills. UA was positive for UTI and the WBC on the higher end of the normal range however the patient was afebrile.  New antibiotic prescription:  D/c Keflex and transition to Fosfomycin 3g po every 48 hours for 2 doses  ED Provider: Comer Locket, PA-C  [name] 12/23/2015, 8:45 AM Infectious Diseases Pharmacist Phone# [phone]

## 2015-12-23 NOTE — Telephone Encounter (Signed)
Post ED Visit - Positive Culture Follow-up: Successful Patient Follow-Up  Culture assessed and recommendations reviewed by: []  Elenor Quinones, Pharm.D. []  Heide Guile, Pharm.D., BCPS []  Parks Neptune, Pharm.D. [x]  Alycia Rossetti, Pharm.D., BCPS []  Dolliver, Pharm.D., BCPS, AAHIVP []  Legrand Como, Pharm.D., BCPS, AAHIVP []  Milus Glazier, Pharm.D. []  Stephens November, Pharm.D.  Positive urine culture  []  Patient discharged without antimicrobial prescription and treatment is now indicated [x]  Organism is resistant to prescribed ED discharge antimicrobial []  Patient with positive blood cultures  Changes discussed with ED provider:ben cartner New antibiotic prescription stop Keflex and start Fosfomycin 3 gram po every 48 hours x 2 doses  Called to [name]   Trevor Phillips C 12/23/2015, 3:20 PM

## 2016-02-06 ENCOUNTER — Encounter (HOSPITAL_COMMUNITY): Payer: Self-pay

## 2016-02-06 ENCOUNTER — Emergency Department (HOSPITAL_COMMUNITY)
Admission: EM | Admit: 2016-02-06 | Discharge: 2016-02-06 | Disposition: A | Discharge status: Home / Self Care | Payer: Medicare Other | Attending: Emergency Medicine | Admitting: Emergency Medicine

## 2016-02-06 DIAGNOSIS — Z8546 Personal history of malignant neoplasm of prostate: Secondary | ICD-10-CM | POA: Diagnosis not present

## 2016-02-06 DIAGNOSIS — Z79899 Other long term (current) drug therapy: Secondary | ICD-10-CM | POA: Diagnosis not present

## 2016-02-06 DIAGNOSIS — Z8719 Personal history of other diseases of the digestive system: Secondary | ICD-10-CM | POA: Diagnosis not present

## 2016-02-06 DIAGNOSIS — Z87440 Personal history of urinary (tract) infections: Secondary | ICD-10-CM | POA: Insufficient documentation

## 2016-02-06 DIAGNOSIS — M7989 Other specified soft tissue disorders: Secondary | ICD-10-CM | POA: Insufficient documentation

## 2016-02-06 DIAGNOSIS — Z792 Long term (current) use of antibiotics: Secondary | ICD-10-CM | POA: Insufficient documentation

## 2016-02-06 DIAGNOSIS — Z7982 Long term (current) use of aspirin: Secondary | ICD-10-CM | POA: Diagnosis not present

## 2016-02-06 DIAGNOSIS — R600 Localized edema: Secondary | ICD-10-CM | POA: Insufficient documentation

## 2016-02-06 DIAGNOSIS — Z87891 Personal history of nicotine dependence: Secondary | ICD-10-CM | POA: Insufficient documentation

## 2016-02-06 LAB — BASIC METABOLIC PANEL
ANION GAP: 7 (ref 5–15)
BUN: 21 mg/dL — AB (ref 6–20)
CALCIUM: 9.4 mg/dL (ref 8.9–10.3)
CO2: 25 mmol/L (ref 22–32)
Chloride: 106 mmol/L (ref 101–111)
Creatinine, Ser: 1.07 mg/dL (ref 0.61–1.24)
GFR calc Af Amer: >60 mL/min (ref >60)
GFR calc non Af Amer: >60 mL/min (ref >60)
GLUCOSE: 98 mg/dL (ref 65–99)
Potassium: 4.1 mmol/L (ref 3.5–5.1)
Sodium: 138 mmol/L (ref 135–145)

## 2016-02-06 LAB — CBC
HEMATOCRIT: 37.4 % — AB (ref 39.0–52.0)
Hemoglobin: 12.3 g/dL — ABNORMAL LOW (ref 13.0–17.0)
MCH: 30.4 pg (ref 26.0–34.0)
MCHC: 32.9 g/dL (ref 30.0–36.0)
MCV: 92.6 fL (ref 78.0–100.0)
PLATELETS: 176 10*3/uL (ref 150–400)
RBC: 4.04 MIL/uL — ABNORMAL LOW (ref 4.22–5.81)
RDW: 13.3 % (ref 11.5–15.5)
WBC: 8.6 10*3/uL (ref 4.0–10.5)

## 2016-02-06 LAB — D-DIMER, QUANTITATIVE: D-Dimer, Quant: 0.41 ug/mL-FEU (ref 0.00–0.50)

## 2016-02-06 NOTE — ED Provider Notes (Signed)
CSN: [id number]     Arrival date & time 02/06/16  1709 History   First MD Initiated Contact with Patient 02/06/16 1859     Chief Complaint  Patient presents with  . RLE swelling       HPI 82 y.o. male presenting at the request of his primary care doctor with right lower extremity swelling. Patient reports that the swelling began about a week ago and has been gradually increasing. Denies trauma, associated erythema, or pain. He has not had any fevers. No history of DVT or PE. No swelling to the left leg. He denies history of CHF or renal insufficiency. He otherwise feels normal. He is ambulating without difficulty.   Past Medical History  Diagnosis Date  . UTI (urinary tract infection)   . Urinary retention with incomplete bladder emptying   . Cancer of prostate ([place])   . Diverticular disease    Past Surgical History  Procedure Laterality Date  . Prostate surgery     No family history on file. Social History  Substance Use Topics  . Smoking status: Former Research scientist (life sciences)  . Smokeless tobacco: Former Systems developer  . Alcohol Use: Yes     Comment: "very little"    Review of Systems  Constitutional: Negative for fever, chills, activity change and appetite change.  HENT: Negative for congestion, facial swelling, rhinorrhea and sore throat.   Eyes: Negative for visual disturbance.  Respiratory: Negative for cough, shortness of breath and wheezing.   Cardiovascular: Positive for leg swelling (R lower leg). Negative for chest pain and palpitations.  Gastrointestinal: Negative for nausea, vomiting, abdominal pain, diarrhea, constipation and blood in stool.  Genitourinary: Negative for dysuria, frequency, hematuria, flank pain and difficulty urinating.  Musculoskeletal: Negative for myalgias, back pain, joint swelling, arthralgias, gait problem, neck pain and neck stiffness.  Skin: Negative for color change and rash.  Neurological: Negative for dizziness, weakness, light-headedness and headaches.   Psychiatric/Behavioral: Negative for behavioral problems, confusion and agitation.      Allergies  Review of patient's allergies indicates no known allergies.  Home Medications   Prior to Admission medications   Medication Sig Start Date End Date Taking? Authorizing Provider  aspirin EC 81 MG tablet Take 81 mg by mouth every morning.    Historical Provider, MD  b complex vitamins tablet Take 1 tablet by mouth every morning.     Historical Provider, MD  cephALEXin (KEFLEX) 500 MG capsule Take 1 capsule (500 mg total) by mouth 4 (four) times daily. [id number]   Delora Fuel, MD  cetirizine (ZYRTEC) 10 MG tablet Take 10 mg by mouth every morning.     Historical Provider, MD  Cholecalciferol (VITAMIN D3) 2000 UNITS TABS Take 2,000 Units by mouth daily.    Historical Provider, MD  Coenzyme Q10 (CO Q-10) 200 MG CAPS Take 200 mg by mouth daily.    Historical Provider, MD  ezetimibe (ZETIA) 10 MG tablet Take 10 mg by mouth every evening.     Historical Provider, MD  Glucos-Chond-Hyal Ac-Ca Fructo (MOVE FREE JOINT HEALTH ADVANCE PO) Take 1 capsule by mouth daily.    Historical Provider, MD  L-Lysine 500 MG TABS Take 500 mg by mouth daily.    Historical Provider, MD  Lutein-Zeaxanthin 25-5 MG CAPS Take 1 capsule by mouth daily.    Historical Provider, MD  methenamine (HIPREX) 1 G tablet Take 0.5 g by mouth 2 (two) times daily with a meal.    Historical Provider, MD  Multiple Vitamins-Minerals (MULTIVITAMIN WITH MINERALS) tablet  Take 1 tablet by mouth every morning.     Historical Provider, MD  Omega-3 Fatty Acids (FISH OIL PO) Take 1 capsule by mouth daily.     Historical Provider, MD  Probiotic Product (PROBIOTIC DAILY PO) Take 1 capsule by mouth daily.     Historical Provider, MD  Resveratrol 100 MG CAPS Take 100 mg by mouth daily.    Historical Provider, MD  Zinc 50 MG CAPS Take 50 mg by mouth every evening.    Historical Provider, MD   BP 166/76 mmHg  Pulse 55  Temp(Src) 97.3 F (36.3 C)  (Oral)  Resp 17  SpO2 99% Physical Exam  Constitutional: He is oriented to person, place, and time. He appears well-developed and well-nourished. No distress.  HENT:  Head: Normocephalic and atraumatic.  Right Ear: External ear normal.  Left Ear: External ear normal.  Nose: Nose normal.  Mouth/Throat: Oropharynx is clear and moist. No oropharyngeal exudate.  Eyes: Conjunctivae are normal. Pupils are equal, round, and reactive to light. Right eye exhibits no discharge. Left eye exhibits no discharge. No scleral icterus.  Neck: Normal range of motion. Neck supple. No tracheal deviation present.  Cardiovascular: Normal rate, regular rhythm, normal heart sounds and intact distal pulses.  Exam reveals no gallop and no friction rub.   No murmur heard. Pulmonary/Chest: Effort normal and breath sounds normal. No respiratory distress. He has no wheezes. He has no rales.  Abdominal: Soft. Bowel sounds are normal. He exhibits no distension and no mass. There is no tenderness. There is no rebound and no guarding.  Musculoskeletal: Normal range of motion. He exhibits edema (R calf and ankle swelling, no overlying erythema. Full ROM without pain. ). He exhibits no tenderness.  Neurological: He is alert and oriented to person, place, and time. He exhibits normal muscle tone.  Skin: Skin is warm and dry. No rash noted. He is not diaphoretic.  Psychiatric: He has a normal mood and affect. His behavior is normal. Judgment and thought content normal.    ED Course  Procedures (including critical care time) Labs Review Labs Reviewed  CBC - Abnormal; Notable for the following:    RBC 4.04 (*)    Hemoglobin 12.3 (*)    HCT 37.4 (*)    All other components within normal limits  BASIC METABOLIC PANEL - Abnormal; Notable for the following:    BUN 21 (*)    All other components within normal limits  D-DIMER, QUANTITATIVE (NOT AT [facility])    Imaging Review No results found. I have personally reviewed and  evaluated these images and lab results as part of my medical decision-making.   EKG Interpretation None      MDM   Final diagnoses:  Swelling of right lower extremity    Patient is generally well-appearing. Mild swelling noted to the right calf and ankle but there is full range of motion of the joints of the right lower extremity. Patient is able to cannulate. Good distal pulses and pt is neurovascularly intact. Labs unremarkable. D-dimer negative. As patient continues to be concerned about DVT, will recommend that he return in the morning for Doppler to the right lower extremity as we are unable to obtain this tonight. I have a very low suspicion for DVT so will not start anticoagulation at this time. He was discharged in good condition.    Zipporah Plants, MD 02/06/16 2042  Pattricia Boss, MD 02/08/16 [phone]

## 2016-02-06 NOTE — Discharge Instructions (Signed)
Edema °Edema is an abnormal buildup of fluids in your body tissues. Edema is somewhat dependent on gravity to pull the fluid to the lowest place in your body. That makes the condition more common in the legs and thighs (lower extremities). Painless swelling of the feet and ankles is common and becomes more likely as you get older. It is also common in looser tissues, like around your eyes.  °When the affected area is squeezed, the fluid may move out of that spot and leave a dent for a few moments. This dent is called pitting.  °CAUSES  °There are many possible causes of edema. Eating too much salt and being on your feet or sitting for a long time can cause edema in your legs and ankles. Hot weather may make edema worse. Common medical causes of edema include: °· Heart failure. °· Liver disease. °· Kidney disease. °· Weak blood vessels in your legs. °· Cancer. °· An injury. °· Pregnancy. °· Some medications. °· Obesity.  °SYMPTOMS  °Edema is usually painless. Your skin may look swollen or shiny.  °DIAGNOSIS  °Your health care provider may be able to diagnose edema by asking about your medical history and doing a physical exam. You may need to have tests such as X-rays, an electrocardiogram, or blood tests to check for medical conditions that may cause edema.  °TREATMENT  °Edema treatment depends on the cause. If you have heart, liver, or kidney disease, you need the treatment appropriate for these conditions. General treatment may include: °· Elevation of the affected body part above the level of your heart. °· Compression of the affected body part. Pressure from elastic bandages or support stockings squeezes the tissues and forces fluid back into the blood vessels. This keeps fluid from entering the tissues. °· Restriction of fluid and salt intake. °· Use of a water pill (diuretic). These medications are appropriate only for some types of edema. They pull fluid out of your body and make you urinate more often. This  gets rid of fluid and reduces swelling, but diuretics can have side effects. Only use diuretics as directed by your health care provider. °HOME CARE INSTRUCTIONS  °· Keep the affected body part above the level of your heart when you are lying down.   °· Do not sit still or stand for prolonged periods.   °· Do not put anything directly under your knees when lying down. °· Do not wear constricting clothing or garters on your upper legs.   °· Exercise your legs to work the fluid back into your blood vessels. This may help the swelling go down.   °· Wear elastic bandages or support stockings to reduce ankle swelling as directed by your health care provider.   °· Eat a low-salt diet to reduce fluid if your health care provider recommends it.   °· Only take medicines as directed by your health care provider.  °SEEK MEDICAL CARE IF:  °· Your edema is not responding to treatment. °· You have heart, liver, or kidney disease and notice symptoms of edema. °· You have edema in your legs that does not improve after elevating them.   °· You have sudden and unexplained weight gain. °SEEK IMMEDIATE MEDICAL CARE IF:  °· You develop shortness of breath or chest pain.   °· You cannot breathe when you lie down. °· You develop pain, redness, or warmth in the swollen areas.   °· You have heart, liver, or kidney disease and suddenly get edema. °· You have a fever and your symptoms suddenly get worse. °MAKE SURE YOU:  °·   Understand these instructions. °· Will watch your condition. °· Will get help right away if you are not doing well or get worse. °  °This information is not intended to replace advice given to you by your health care provider. Make sure you discuss any questions you have with your health care provider. °  °Document Released: 10/26/2005 Document Revised: 11/16/2014 Document Reviewed: 08/18/2013 °Elsevier Interactive Patient Education ©2016 [employer]vier Inc. ° °

## 2016-02-06 NOTE — ED Notes (Signed)
Patient here to be evaluated for possible DVT RLE. Patient here with 1 week of swelling, no trauma, no pain

## 2016-02-07 ENCOUNTER — Ambulatory Visit (HOSPITAL_COMMUNITY)
Admission: RE | Admit: 2016-02-07 | Discharge: 2016-02-07 | Disposition: A | Discharge status: Home / Self Care | Payer: Medicare Other | Source: Ambulatory Visit | Attending: Emergency Medicine | Admitting: Emergency Medicine

## 2016-02-07 DIAGNOSIS — M7989 Other specified soft tissue disorders: Secondary | ICD-10-CM | POA: Insufficient documentation

## 2016-02-07 NOTE — Progress Notes (Signed)
VASCULAR LAB PRELIMINARY  PRELIMINARY  PRELIMINARY  PRELIMINARY  Right lower extremity venous duplex completed.    Preliminary report:  Right:  No evidence of DVT, superficial thrombosis, or Baker's cyst.  Lyn Deemer, RVS 02/07/2016, 9:57 AM

## 2016-05-31 ENCOUNTER — Emergency Department (HOSPITAL_COMMUNITY): Payer: Medicare Other

## 2016-05-31 ENCOUNTER — Emergency Department (HOSPITAL_COMMUNITY)
Admission: EM | Admit: 2016-05-31 | Discharge: 2016-05-31 | Disposition: A | Discharge status: Home / Self Care | Payer: Medicare Other | Attending: Emergency Medicine | Admitting: Emergency Medicine

## 2016-05-31 ENCOUNTER — Encounter (HOSPITAL_COMMUNITY): Payer: Self-pay

## 2016-05-31 DIAGNOSIS — N3001 Acute cystitis with hematuria: Secondary | ICD-10-CM | POA: Diagnosis not present

## 2016-05-31 DIAGNOSIS — Z7982 Long term (current) use of aspirin: Secondary | ICD-10-CM | POA: Insufficient documentation

## 2016-05-31 DIAGNOSIS — Z8546 Personal history of malignant neoplasm of prostate: Secondary | ICD-10-CM | POA: Insufficient documentation

## 2016-05-31 DIAGNOSIS — Z87891 Personal history of nicotine dependence: Secondary | ICD-10-CM | POA: Diagnosis not present

## 2016-05-31 DIAGNOSIS — R319 Hematuria, unspecified: Secondary | ICD-10-CM | POA: Diagnosis present

## 2016-05-31 LAB — URINALYSIS, ROUTINE W REFLEX MICROSCOPIC
Glucose, UA: NEGATIVE mg/dL
KETONES UR: 15 mg/dL — AB
NITRITE: POSITIVE — AB
Specific Gravity, Urine: 1.03 (ref 1.005–1.030)
pH: 5 (ref 5.0–8.0)

## 2016-05-31 LAB — CBC
HCT: 38.9 % — ABNORMAL LOW (ref 39.0–52.0)
Hemoglobin: 12.7 g/dL — ABNORMAL LOW (ref 13.0–17.0)
MCH: 30.5 pg (ref 26.0–34.0)
MCHC: 32.6 g/dL (ref 30.0–36.0)
MCV: 93.5 fL (ref 78.0–100.0)
PLATELETS: 204 10*3/uL (ref 150–400)
RBC: 4.16 MIL/uL — ABNORMAL LOW (ref 4.22–5.81)
RDW: 14.2 % (ref 11.5–15.5)
WBC: 9.7 10*3/uL (ref 4.0–10.5)

## 2016-05-31 LAB — BASIC METABOLIC PANEL
Anion gap: 5 (ref 5–15)
BUN: 25 mg/dL — AB (ref 6–20)
CALCIUM: 9.2 mg/dL (ref 8.9–10.3)
CO2: 25 mmol/L (ref 22–32)
CREATININE: 1.12 mg/dL (ref 0.61–1.24)
Chloride: 108 mmol/L (ref 101–111)
GFR calc Af Amer: >60 mL/min (ref >60)
GFR, EST NON AFRICAN AMERICAN: 59 mL/min — AB (ref >60)
GLUCOSE: 97 mg/dL (ref 65–99)
POTASSIUM: 3.9 mmol/L (ref 3.5–5.1)
SODIUM: 138 mmol/L (ref 135–145)

## 2016-05-31 LAB — URINE MICROSCOPIC-ADD ON

## 2016-05-31 MED ORDER — SODIUM CHLORIDE 0.9 % IV BOLUS (SEPSIS)
500.0000 mL | Freq: Once | INTRAVENOUS | Status: AC
  Administered 2016-05-31: 500 mL via INTRAVENOUS

## 2016-05-31 MED ORDER — FOSFOMYCIN TROMETHAMINE 3 G PO PACK
3.0000 g | PACK | Freq: Once | ORAL | 0 refills | Status: AC
Start: 2016-05-31 — End: 2016-05-31

## 2016-05-31 MED ORDER — IOPAMIDOL (ISOVUE-300) INJECTION 61%
INTRAVENOUS | Status: AC
  Administered 2016-05-31: 100 mL
  Filled 2016-05-31: qty 100

## 2016-05-31 MED ORDER — FOSFOMYCIN TROMETHAMINE 3 G PO PACK
3.0000 g | PACK | Freq: Once | ORAL | Status: AC
  Administered 2016-05-31: 3 g via ORAL
  Filled 2016-05-31: qty 3

## 2016-05-31 NOTE — ED Notes (Signed)
>  350 mL on bladder scan

## 2016-05-31 NOTE — ED Notes (Signed)
Care transferred to Santiago Glad, [place]

## 2016-05-31 NOTE — ED Provider Notes (Addendum)
[facility] DEPT Provider Note   CSN: [id number] Arrival date & time: 05/31/16  1131  First Provider Contact:  First MD Initiated Contact with Patient 05/31/16 1346     History   Chief Complaint Chief Complaint  Patient presents with  . Hematuria   HPI Trevor Phillips is a 82 y.o. male.  HPI 82 y.o. male with a hx of Prostate Cancer x 25 years ago, presents to the Emergency Department today complaining of hematuria with onset x 5 days ago. Painless. No CP/SOB/ABD pain. Notes consistent red urine while urinating. Of note, pt self catheterizes himself ever since prostate resection x 25 years ago due to malignancy. No fevers. No N/V. No dysuria. Pt noted blood clots while catheterizing. No other symptoms noted.    Past Medical History:  Diagnosis Date  . Cancer of prostate ([place])   . Diverticular disease   . Urinary retention with incomplete bladder emptying   . UTI (urinary tract infection)     There are no active problems to display for this patient.   Past Surgical History:  Procedure Laterality Date  . PROSTATE SURGERY       Home Medications    Prior to Admission medications   Medication Sig Start Date End Date Taking? Authorizing Provider  aspirin EC 81 MG tablet Take 81 mg by mouth every morning.    Historical Provider, MD  b complex vitamins tablet Take 1 tablet by mouth every morning.     Historical Provider, MD  cephALEXin (KEFLEX) 500 MG capsule Take 1 capsule (500 mg total) by mouth 4 (four) times daily. [id number]   Delora Fuel, MD  cetirizine (ZYRTEC) 10 MG tablet Take 10 mg by mouth every morning.     Historical Provider, MD  Cholecalciferol (VITAMIN D3) 2000 UNITS TABS Take 2,000 Units by mouth daily.    Historical Provider, MD  Coenzyme Q10 (CO Q-10) 200 MG CAPS Take 200 mg by mouth daily.    Historical Provider, MD  ezetimibe (ZETIA) 10 MG tablet Take 10 mg by mouth every evening.     Historical Provider, MD  Glucos-Chond-Hyal Ac-Ca Fructo (MOVE FREE JOINT  HEALTH ADVANCE PO) Take 1 capsule by mouth daily.    Historical Provider, MD  L-Lysine 500 MG TABS Take 500 mg by mouth daily.    Historical Provider, MD  Lutein-Zeaxanthin 25-5 MG CAPS Take 1 capsule by mouth daily.    Historical Provider, MD  methenamine (HIPREX) 1 G tablet Take 0.5 g by mouth 2 (two) times daily with a meal.    Historical Provider, MD  Multiple Vitamins-Minerals (MULTIVITAMIN WITH MINERALS) tablet Take 1 tablet by mouth every morning.     Historical Provider, MD  Omega-3 Fatty Acids (FISH OIL PO) Take 1 capsule by mouth daily.     Historical Provider, MD  Probiotic Product (PROBIOTIC DAILY PO) Take 1 capsule by mouth daily.     Historical Provider, MD  Resveratrol 100 MG CAPS Take 100 mg by mouth daily.    Historical Provider, MD  Zinc 50 MG CAPS Take 50 mg by mouth every evening.    Historical Provider, MD    Family History No family history on file.  Social History Social History  Substance Use Topics  . Smoking status: Former Research scientist (life sciences)  . Smokeless tobacco: Former Systems developer  . Alcohol use Yes     Comment: "very little"     Allergies   Review of patient's allergies indicates no known allergies.   Review of Systems Review  of Systems ROS reviewed and all are negative for acute change except as noted in the HPI.  Physical Exam Updated Vital Signs BP 142/82   Pulse 72   Temp 98.3 F (36.8 C) (Oral)   Resp 18   SpO2 98%   Physical Exam  Constitutional: He is oriented to person, place, and time. Vital signs are normal. He appears well-developed and well-nourished.  HENT:  Head: Normocephalic.  Right Ear: Hearing normal.  Left Ear: Hearing normal.  Eyes: Conjunctivae and EOM are normal. Pupils are equal, round, and reactive to light.  Neck: Normal range of motion. Neck supple.  Cardiovascular: Normal rate, regular rhythm, normal heart sounds and intact distal pulses.   Pulmonary/Chest: Effort normal and breath sounds normal.  Abdominal: Soft. Normal  appearance. There is no tenderness. There is no rigidity, no rebound, no guarding, no CVA tenderness, no tenderness at McBurney's point and negative Murphy's sign.  Neurological: He is alert and oriented to person, place, and time.  Skin: Skin is warm and dry.  Psychiatric: He has a normal mood and affect. His speech is normal and behavior is normal. Thought content normal.   ED Treatments / Results  Labs (all labs ordered are listed, but only abnormal results are displayed) Labs Reviewed  URINALYSIS, ROUTINE W REFLEX MICROSCOPIC (NOT AT [facility]) - Abnormal; Notable for the following:       Result Value   Color, Urine RED (*)    APPearance TURBID (*)    Hgb urine dipstick LARGE (*)    Bilirubin Urine MODERATE (*)    Ketones, ur 15 (*)    Protein, ur >300 (*)    Nitrite POSITIVE (*)    Leukocytes, UA MODERATE (*)    All other components within normal limits  URINE MICROSCOPIC-ADD ON - Abnormal; Notable for the following:    Squamous Epithelial / LPF 6-30 (*)    Bacteria, UA FEW (*)    All other components within normal limits  CBC - Abnormal; Notable for the following:    RBC 4.16 (*)    Hemoglobin 12.7 (*)    HCT 38.9 (*)    All other components within normal limits  BASIC METABOLIC PANEL - Abnormal; Notable for the following:    BUN 25 (*)    GFR calc non Af Amer 59 (*)    All other components within normal limits  URINE CULTURE    EKG  EKG Interpretation None       Radiology Ct Abdomen Pelvis W Contrast  Result Date: 05/31/2016 CLINICAL DATA:  Hematuria x3 days, history of prostate cancer status post prostatectomy EXAM: CT ABDOMEN AND PELVIS WITH CONTRAST TECHNIQUE: Multidetector CT imaging of the abdomen and pelvis was performed using the standard protocol following bolus administration of intravenous contrast. CONTRAST:  90 mL Isovue 300 IV COMPARISON:  01/20/2016 FINDINGS: Lower chest: Subpleural reticulation/ dependent atelectasis in the bilateral lung bases. Coronary  atherosclerosis. Hepatobiliary: Liver is within normal limits. No suspicious/enhancing hepatic lesions. Small left renal cysts measuring up to 6 mm (series 2/ image 22). Gallbladder is unremarkable. No intrahepatic or extrahepatic ductal dilatation. 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 No associated main pancreatic ductal dilatation. Spleen: Within normal limits. Adrenals/Urinary Tract: Within normal limits. Kidneys are within normal limits. Right renal sinus cysts. No hydronephrosis. Mild diffuse bladder wall thickening with mucosal hyperenhancement (series 2/image 68). Stomach/Bowel: Stomach is within normal limits. No evidence of bowel obstruction.  Appendix is not discretely visualized. Colonic diverticulosis, without evidence of diverticulitis. Vascular/Lymphatic: Atherosclerotic calcifications of the abdominal aorta and branch vessels. No evidence of abdominal aortic aneurysm. No suspicious abdominopelvic lymphadenopathy. Reproductive: Status post prostatectomy. Other: No abdominopelvic ascites. Musculoskeletal: Stable probable bone island in the left sacrum (series 2/ image 56). Mild degenerative changes of the visualized thoracolumbar spine. IMPRESSION: Mild diffuse bladder wall thickening with mucosal hyperenhancement, suspicious for cystitis. In the setting of hematuria, consider cystoscopic correlation as clinically warranted. Status post prostatectomy. No findings suspicious for metastatic disease. Additional ancillary findings as above. Electronically Signed   By: Julian Hy M.D.   On: 05/31/2016 16:32   Procedures Procedures (including critical care time)  Medications Ordered in ED Medications - No data to display   Initial Impression / Assessment and Plan / ED Course  I have reviewed the triage vital signs and the nursing notes.  Pertinent labs & imaging results that were available during my care of the  patient were reviewed by me and considered in my medical decision making (see chart for details).  Clinical Course    Final Clinical Impressions(s) / ED Diagnoses  I have reviewed and evaluated the relevant laboratory values I have reviewed and evaluated the relevant imaging studies.  I have reviewed the relevant previous healthcare records. I obtained HPI from historian. Patient discussed with supervising physician  ED Course:  Assessment: Pt is a 66yM with hx Prostate cancer who presents with heamturia x 5 days. Similar occurrence in the past x 3 months ago. Treated for UTI. On exam, pt in NAD. Nontoxic/nonseptic appearing. VSS. Afebrile. Lungs CTA. Heart RRR. Abdomen nontender soft. CBC/BMP unremarkable. Concern for malignancy due to previous prostate cancer. CT Pelvis negative for malignancy. Showed cystitis. UA shows UTI. Reviewed culture reports. Will give Fosfomycin. Bladder scan with 350cc post residual. Consult to Urology (Dr. Junious Silk) will see in office. Pt unable to completely void with self cath. Attempted to placed indwelling foley, but was too painful for patient. Able to drain bladder effectively with irrigation as well. Will counsel patient to in and out as usual until follow up. Given first dose of Fosfomycin in ED. Plan is to DC home with follow up to Urology. At time of discharge, Patient is in no acute distress. Vital Signs are stable. Patient is able to ambulate. Patient able to tolerate PO.    Disposition/Plan:  DC Home Additional Verbal discharge instructions given and discussed with patient.  Pt Instructed to f/u with Urology in the next week for evaluation and treatment of symptoms. Return precautions given Pt acknowledges and agrees with plan  Supervising Physician Duffy Bruce, MD   Final diagnoses:  Acute cystitis with hematuria    New Prescriptions New Prescriptions   No medications on file     Shary Decamp, PA-C 05/31/16 1801    Duffy Bruce,  MD 05/31/16 1916    Shary Decamp, PA-C 05/31/16 1937    Duffy Bruce, MD 06/01/16 [phone]

## 2016-05-31 NOTE — ED Triage Notes (Signed)
Patient here with hematuria x 3 days. States that he self caths and has noticed frank blood in urine x 3 days, no pain. NAD

## 2016-05-31 NOTE — ED Notes (Addendum)
This nurse and Cyril Mourning, Ed tech in room to insert foley. Peri care performed, 95F foley inserted, urine drains. Attempt to blow up balloon and PT complains of pain once 3cc is inflated. Balloon is deflated. Catheter advanced further, balloon inflated, PT complains of pain once balloon has 3cc of fluid, balloon deflated. Repositioning catheter attempted again with no positive results. Balloon deflated and catheter removed. Sterile technique maintained. 200cc brown urine drained from bladder during procedure. No bright red blood on catheter upon removal. Shary Decamp, PA made aware of attempt.

## 2016-05-31 NOTE — ED Notes (Signed)
PT returns from CT at this time.  

## 2016-05-31 NOTE — Discharge Instructions (Signed)
Please read and follow all provided instructions.  Your diagnoses today include:  1. Acute cystitis with hematuria     Tests performed today include: Urine test - suggests that you have an infection in your bladder Vital signs. See below for your results today.   Medications prescribed:  Take as prescribed. You will take again on 06-02-16.  Home care instructions:  Follow any educational materials contained in this packet.  Follow-up instructions: Please follow-up with your Urologist next week. Call and confirm on Monday morning.   Return instructions:  Please return to the Emergency Department if you experience worsening symptoms.  Return with fever, worsening pain, persistent vomiting, worsening pain in your back.  Please return if you have any other emergent concerns.  Additional Information:  Your vital signs today were: BP 161/75    Pulse 75    Temp 98.3 F (36.8 C) (Oral)    Resp 16    SpO2 94%  If your blood pressure (BP) was elevated above 135/85 this visit, please have this repeated by your doctor within one month. --------------

## 2016-06-01 LAB — URINE CULTURE

## 2016-07-11 ENCOUNTER — Encounter (HOSPITAL_COMMUNITY): Payer: Self-pay

## 2016-07-11 ENCOUNTER — Observation Stay (HOSPITAL_COMMUNITY)
Admission: EM | Admit: 2016-07-11 | Discharge: 2016-07-13 | Disposition: A | Discharge status: Home / Self Care | Payer: Medicare Other | Attending: Internal Medicine | Admitting: Internal Medicine

## 2016-07-11 DIAGNOSIS — R310 Gross hematuria: Secondary | ICD-10-CM | POA: Insufficient documentation

## 2016-07-11 DIAGNOSIS — Z87891 Personal history of nicotine dependence: Secondary | ICD-10-CM | POA: Diagnosis not present

## 2016-07-11 DIAGNOSIS — N319 Neuromuscular dysfunction of bladder, unspecified: Secondary | ICD-10-CM | POA: Diagnosis not present

## 2016-07-11 DIAGNOSIS — Z7982 Long term (current) use of aspirin: Secondary | ICD-10-CM | POA: Diagnosis not present

## 2016-07-11 DIAGNOSIS — R319 Hematuria, unspecified: Secondary | ICD-10-CM | POA: Diagnosis present

## 2016-07-11 DIAGNOSIS — K5790 Diverticulosis of intestine, part unspecified, without perforation or abscess without bleeding: Secondary | ICD-10-CM | POA: Diagnosis present

## 2016-07-11 DIAGNOSIS — N390 Urinary tract infection, site not specified: Secondary | ICD-10-CM | POA: Diagnosis not present

## 2016-07-11 DIAGNOSIS — R339 Retention of urine, unspecified: Secondary | ICD-10-CM | POA: Diagnosis present

## 2016-07-11 DIAGNOSIS — Z79899 Other long term (current) drug therapy: Secondary | ICD-10-CM | POA: Insufficient documentation

## 2016-07-11 DIAGNOSIS — Z87440 Personal history of urinary (tract) infections: Secondary | ICD-10-CM | POA: Insufficient documentation

## 2016-07-11 DIAGNOSIS — D62 Acute posthemorrhagic anemia: Secondary | ICD-10-CM | POA: Diagnosis not present

## 2016-07-11 DIAGNOSIS — C61 Malignant neoplasm of prostate: Secondary | ICD-10-CM

## 2016-07-11 DIAGNOSIS — Z8546 Personal history of malignant neoplasm of prostate: Secondary | ICD-10-CM | POA: Diagnosis not present

## 2016-07-11 DIAGNOSIS — B952 Enterococcus as the cause of diseases classified elsewhere: Secondary | ICD-10-CM | POA: Insufficient documentation

## 2016-07-11 LAB — COMPREHENSIVE METABOLIC PANEL
ALT: 16 U/L — ABNORMAL LOW (ref 17–63)
AST: 23 U/L (ref 15–41)
Albumin: 3.2 g/dL — ABNORMAL LOW (ref 3.5–5.0)
Alkaline Phosphatase: 55 U/L (ref 38–126)
Anion gap: 5 (ref 5–15)
BUN: 27 mg/dL — ABNORMAL HIGH (ref 6–20)
CO2: 23 mmol/L (ref 22–32)
Calcium: 9 mg/dL (ref 8.9–10.3)
Chloride: 109 mmol/L (ref 101–111)
Creatinine, Ser: 1.13 mg/dL (ref 0.61–1.24)
GFR calc Af Amer: >60 mL/min (ref >60)
GFR calc non Af Amer: 59 mL/min — ABNORMAL LOW (ref >60)
Glucose, Bld: 102 mg/dL — ABNORMAL HIGH (ref 65–99)
Potassium: 3.8 mmol/L (ref 3.5–5.1)
Sodium: 137 mmol/L (ref 135–145)
Total Bilirubin: 0.9 mg/dL (ref 0.3–1.2)
Total Protein: 5.7 g/dL — ABNORMAL LOW (ref 6.5–8.1)

## 2016-07-11 LAB — TYPE AND SCREEN
ABO/RH(D): O POS
Antibody Screen: NEGATIVE

## 2016-07-11 LAB — CBC WITH DIFFERENTIAL/PLATELET
Basophils Absolute: 0.1 10*3/uL (ref 0.0–0.1)
Basophils Relative: 1 %
Eosinophils Absolute: 0.1 10*3/uL (ref 0.0–0.7)
Eosinophils Relative: 1 %
HCT: 30.3 % — ABNORMAL LOW (ref 39.0–52.0)
Hemoglobin: 9.4 g/dL — ABNORMAL LOW (ref 13.0–17.0)
Lymphocytes Relative: 11 %
Lymphs Abs: 1.2 10*3/uL (ref 0.7–4.0)
MCH: 29 pg (ref 26.0–34.0)
MCHC: 31 g/dL (ref 30.0–36.0)
MCV: 93.5 fL (ref 78.0–100.0)
Monocytes Absolute: 0.4 10*3/uL (ref 0.1–1.0)
Monocytes Relative: 4 %
Neutro Abs: 9.1 10*3/uL — ABNORMAL HIGH (ref 1.7–7.7)
Neutrophils Relative %: 83 %
Platelets: 232 10*3/uL (ref 150–400)
RBC: 3.24 MIL/uL — ABNORMAL LOW (ref 4.22–5.81)
RDW: 13.3 % (ref 11.5–15.5)
WBC: 10.8 10*3/uL — ABNORMAL HIGH (ref 4.0–10.5)

## 2016-07-11 LAB — URINALYSIS, ROUTINE W REFLEX MICROSCOPIC
Bilirubin Urine: NEGATIVE
Glucose, UA: NEGATIVE mg/dL
Ketones, ur: 15 mg/dL — AB
Nitrite: NEGATIVE
Protein, ur: >300 mg/dL — AB
Specific Gravity, Urine: 1.025 (ref 1.005–1.030)
pH: 6 (ref 5.0–8.0)

## 2016-07-11 LAB — ABO/RH: ABO/RH(D): O POS

## 2016-07-11 LAB — PSA: PSA: 0.01 ng/mL (ref 0.00–4.00)

## 2016-07-11 LAB — URINE MICROSCOPIC-ADD ON
Bacteria, UA: NONE SEEN
Squamous Epithelial / LPF: NONE SEEN

## 2016-07-11 MED ORDER — RESVERATROL 100 MG PO CAPS: 100.0000 mg | ORAL_CAPSULE | Freq: Every day | ORAL | Status: DC

## 2016-07-11 MED ORDER — POLYETHYLENE GLYCOL 3350 17 G PO PACK: 17.0000 g | PACK | Freq: Every day | ORAL | Status: DC | PRN

## 2016-07-11 MED ORDER — SODIUM CHLORIDE 0.9 % IV BOLUS (SEPSIS)
1000.0000 mL | Freq: Once | INTRAVENOUS | Status: AC
  Administered 2016-07-11: 1000 mL via INTRAVENOUS

## 2016-07-11 MED ORDER — LORATADINE 10 MG PO TABS
10.0000 mg | ORAL_TABLET | Freq: Every day | ORAL | Status: DC
Start: 2016-07-11 — End: 2016-07-13
  Administered 2016-07-12 – 2016-07-13 (×2): 10 mg via ORAL
  Filled 2016-07-11 (×2): qty 1

## 2016-07-11 MED ORDER — ACETAMINOPHEN 325 MG PO TABS: 650.0000 mg | ORAL_TABLET | Freq: Four times a day (QID) | ORAL | Status: DC | PRN

## 2016-07-11 MED ORDER — CO Q-10 200 MG PO CAPS: 200.0000 mg | ORAL_CAPSULE | Freq: Every day | ORAL | Status: DC

## 2016-07-11 MED ORDER — ACETAMINOPHEN 650 MG RE SUPP: 650.0000 mg | Freq: Four times a day (QID) | RECTAL | Status: DC | PRN

## 2016-07-11 MED ORDER — SODIUM CHLORIDE 0.9% FLUSH
3.0000 mL | Freq: Two times a day (BID) | INTRAVENOUS | Status: DC
  Administered 2016-07-12 – 2016-07-13 (×3): 3 mL via INTRAVENOUS

## 2016-07-11 MED ORDER — ONDANSETRON HCL 4 MG/2ML IJ SOLN: 4.0000 mg | Freq: Three times a day (TID) | INTRAMUSCULAR | Status: AC | PRN

## 2016-07-11 MED ORDER — DEXTROSE 5 % IV SOLN
1.0000 g | Freq: Once | INTRAVENOUS | Status: AC
  Administered 2016-07-11: 1 g via INTRAVENOUS
  Filled 2016-07-11: qty 10

## 2016-07-11 MED ORDER — SODIUM CHLORIDE 0.9 % IV SOLN
INTRAVENOUS | Status: AC
  Administered 2016-07-11: 16:00:00 via INTRAVENOUS

## 2016-07-11 MED ORDER — DEXTROSE 5 % IV SOLN: 1.0000 g | Freq: Once | INTRAVENOUS | Status: DC

## 2016-07-11 MED ORDER — EZETIMIBE 10 MG PO TABS
10.0000 mg | ORAL_TABLET | Freq: Every evening | ORAL | Status: DC
  Administered 2016-07-11 – 2016-07-12 (×2): 10 mg via ORAL
  Filled 2016-07-11 (×2): qty 1

## 2016-07-11 MED ORDER — VITAMIN D 1000 UNITS PO TABS
2000.0000 [IU] | ORAL_TABLET | Freq: Every day | ORAL | Status: DC
  Administered 2016-07-11 – 2016-07-13 (×3): 2000 [IU] via ORAL
  Filled 2016-07-11 (×4): qty 2

## 2016-07-11 MED ORDER — L-LYSINE 500 MG PO TABS: 500.0000 mg | ORAL_TABLET | Freq: Every day | ORAL | Status: DC

## 2016-07-11 NOTE — ED Notes (Signed)
Admitting at bedside 

## 2016-07-11 NOTE — Consult Note (Signed)
Urology Consult  Referring physician: Dr. Denton Brick Reason for referral: gross hematuria  Chief Complaint: Gross hematuria  History of Present Illness: Trevor Phillips is 82yo with a hx of prostate cancer and neurogenic bladder admitted with gross hematuria, UTI, and anemia. The pt has been doing CIC for chronic retention and 3 days ago developed worsening gross hematuria. He denies any suprapubic pain. He performs CIC 3-5x per day and he has been draining well even with the gross hematuria. No clots. No hx of UTI. No hx of pelvic radiation.  Past Medical History:  Diagnosis Date  . Cancer of prostate ([place])   . Diverticular disease   . Hematuria   . Urinary retention with incomplete bladder emptying   . UTI (urinary tract infection)    Past Surgical History:  Procedure Laterality Date  . PROSTATE SURGERY      Medications: I have reviewed the patient's current medications. Allergies: No Known Allergies  No family history on file. Social History:  reports that he has quit smoking. He has quit using smokeless tobacco. He reports that he drinks alcohol. He reports that he does not use drugs.  Review of Systems  Genitourinary: Positive for hematuria.  All other systems reviewed and are negative.   Physical Exam:  Vital signs in last 24 hours: Temp:  [97.7 F (36.5 C)] 97.7 F (36.5 C) (09/02 1845) Pulse Rate:  [67-79] 74 (09/02 1845) Resp:  [16-20] 20 (09/02 1845) BP: (110-149)/(44-68) 127/52 (09/02 1845) SpO2:  [97 %-100 %] 100 % (09/02 1845) Weight:  [58.7 kg (129 lb 6.4 oz)] 58.7 kg (129 lb 6.4 oz) (09/02 1845) Physical Exam  Constitutional: He is oriented to person, place, and time. He appears well-developed and well-nourished.  HENT:  Head: Normocephalic and atraumatic.  Eyes: EOM are normal. Pupils are equal, round, and reactive to light.  Neck: Normal range of motion. No thyromegaly present.  Cardiovascular: Normal rate and regular rhythm.   Respiratory: Effort normal. No  respiratory distress.  GI: Soft. He exhibits no distension and no mass. There is no tenderness. There is no rebound and no guarding. Hernia confirmed negative in the right inguinal area and confirmed negative in the left inguinal area.  Genitourinary: Testes normal and penis normal.  Musculoskeletal: Normal range of motion. He exhibits no edema.  Lymphadenopathy:       Right: No inguinal adenopathy present.       Left: No inguinal adenopathy present.  Neurological: He is alert and oriented to person, place, and time.  Skin: Skin is warm and dry.  Psychiatric: He has a normal mood and affect. His behavior is normal. Judgment and thought content normal.    Laboratory Data:  Results for orders placed or performed during the hospital encounter of 07/11/16 (from the past 72 hour(s))  CBC with Differential     Status: Abnormal   Collection Time: 07/11/16 12:39 PM  Result Value Ref Range   WBC 10.8 (H) 4.0 - 10.5 K/uL   RBC 3.24 (L) 4.22 - 5.81 MIL/uL   Hemoglobin 9.4 (L) 13.0 - 17.0 g/dL   HCT 30.3 (L) 39.0 - 52.0 %   MCV 93.5 78.0 - 100.0 fL   MCH 29.0 26.0 - 34.0 pg   MCHC 31.0 30.0 - 36.0 g/dL   RDW 13.3 11.5 - 15.5 %   Platelets 232 150 - 400 K/uL   Neutrophils Relative % 83 %   Neutro Abs 9.1 (H) 1.7 - 7.7 K/uL   Lymphocytes Relative 11 %  Lymphs Abs 1.2 0.7 - 4.0 K/uL   Monocytes Relative 4 %   Monocytes Absolute 0.4 0.1 - 1.0 K/uL   Eosinophils Relative 1 %   Eosinophils Absolute 0.1 0.0 - 0.7 K/uL   Basophils Relative 1 %   Basophils Absolute 0.1 0.0 - 0.1 K/uL  Comprehensive metabolic panel     Status: Abnormal   Collection Time: 07/11/16 12:39 PM  Result Value Ref Range   Sodium 137 135 - 145 mmol/L   Potassium 3.8 3.5 - 5.1 mmol/L   Chloride 109 101 - 111 mmol/L   CO2 23 22 - 32 mmol/L   Glucose, Bld 102 (H) 65 - 99 mg/dL   BUN 27 (H) 6 - 20 mg/dL   Creatinine, Ser 1.13 0.61 - 1.24 mg/dL   Calcium 9.0 8.9 - 10.3 mg/dL   Total Protein 5.7 (L) 6.5 - 8.1 g/dL    Albumin 3.2 (L) 3.5 - 5.0 g/dL   AST 23 15 - 41 U/L   ALT 16 (L) 17 - 63 U/L   Alkaline Phosphatase 55 38 - 126 U/L   Total Bilirubin 0.9 0.3 - 1.2 mg/dL   GFR calc non Af Amer 59 (L) >60 mL/min   GFR calc Af Amer >60 >60 mL/min    Comment: (NOTE) The eGFR has been calculated using the CKD EPI equation. This calculation has not been validated in all clinical situations. eGFR's persistently <60 mL/min signify possible Chronic Kidney Disease.    Anion gap 5 5 - 15  Urinalysis, Routine w reflex microscopic (not at [facility])     Status: Abnormal   Collection Time: 07/11/16  2:14 PM  Result Value Ref Range   Color, Urine RED (A) YELLOW    Comment: BIOCHEMICALS MAY BE AFFECTED BY COLOR   APPearance TURBID (A) CLEAR   Specific Gravity, Urine 1.025 1.005 - 1.030   pH 6.0 5.0 - 8.0   Glucose, UA NEGATIVE NEGATIVE mg/dL   Hgb urine dipstick LARGE (A) NEGATIVE   Bilirubin Urine NEGATIVE NEGATIVE   Ketones, ur 15 (A) NEGATIVE mg/dL   Protein, ur >300 (A) NEGATIVE mg/dL   Nitrite NEGATIVE NEGATIVE   Leukocytes, UA MODERATE (A) NEGATIVE  Urine microscopic-add on     Status: None   Collection Time: 07/11/16  2:14 PM  Result Value Ref Range   Squamous Epithelial / LPF NONE SEEN NONE SEEN   WBC, UA TOO NUMEROUS TO COUNT 0 - 5 WBC/hpf   RBC / HPF TOO NUMEROUS TO COUNT 0 - 5 RBC/hpf   Bacteria, UA NONE SEEN NONE SEEN  Type and screen [place]     Status: None   Collection Time: 07/11/16  3:00 PM  Result Value Ref Range   ABO/RH(D) O POS    Antibody Screen NEG    Sample Expiration 07/14/2016   ABO/Rh     Status: None (Preliminary result)   Collection Time: 07/11/16  3:00 PM  Result Value Ref Range   ABO/RH(D) O POS   PSA     Status: None   Collection Time: 07/11/16  4:12 PM  Result Value Ref Range   PSA 0.01 0.00 - 4.00 ng/mL    Comment: (NOTE) While PSA levels of <=4.0 ng/ml are reported as reference range, some men with levels below 4.0 ng/ml can have prostate cancer  and many men with PSA above 4.0 ng/ml do not have prostate cancer.  Other tests such as free PSA, age specific reference ranges, PSA velocity and  PSA doubling time may be helpful especially in men less than 80 years old.    No results found for this or any previous visit (from the past 240 hour(s)). Creatinine:  Recent Labs  07/11/16 1239  CREATININE 1.13   Baseline Creatinine: 1.1  Impression/Assessment:  82yo with gross hematuria, anemia, likely UTI  Plan:  1. Pt should continue to perform CIC every 3-4 hours and if he is unable to drain due to hematuria please place a 3 way foley and start CBI 2. Continue rocephin pending urine culture. If the urine culture is negative the patient will require outpatient cystoscopy for gross hematuria.  3. Continue to trend hemoglobin.  Nicolette Bang 07/11/2016, 9:10 PM

## 2016-07-11 NOTE — ED Provider Notes (Signed)
[facility] DEPT Provider Note   CSN: [id number] Arrival date & time: 07/11/16  1133     History   Chief Complaint Chief Complaint  Patient presents with  . Hematuria    HPI Trevor Phillips is a 82 y.o. male who presents with a three-month history of intermittent hematuria. Patient states that he has had gross hematuria for a few day increments at a time. Patient states that this episode began last night with a drop of blood and he began urinating a lot of blood today. Patient has had associated fatigue and generalized weakness as well as cold sweats. Patient has also had a associated urgency. Patient has a history of prostatectomy 20 years ago and self caths since. Patient saw Dr. Junious Silk at [facility] urology within the past 2 weeks. He was treated with antibiotics and his symptoms resolved. Patient denies any chest pain, shortness of breath, abdominal pain, nausea, vomiting.  HPI  Past Medical History:  Diagnosis Date  . Cancer of prostate ([place])   . Diverticular disease   . Hematuria   . Urinary retention with incomplete bladder emptying   . UTI (urinary tract infection)     Patient Active Problem List   Diagnosis Date Noted  . Acute blood loss anemia 07/11/2016  . Urinary retention with incomplete bladder emptying   . Diverticular disease   . Hematuria   . Cancer of prostate [facility])     Past Surgical History:  Procedure Laterality Date  . PROSTATE SURGERY         Home Medications    Prior to Admission medications   Medication Sig Start Date End Date Taking? Authorizing Provider  aspirin EC 81 MG tablet Take 81 mg by mouth every morning.   Yes Historical Provider, MD  b complex vitamins tablet Take 1 tablet by mouth every morning.    Yes Historical Provider, MD  cetirizine (ZYRTEC) 10 MG tablet Take 10 mg by mouth every morning.    Yes Historical Provider, MD  Cholecalciferol (VITAMIN D3) 2000 UNITS TABS Take 2,000 Units by mouth daily.   Yes Historical Provider,  MD  Coenzyme Q10 (CO Q-10) 200 MG CAPS Take 200 mg by mouth daily.   Yes Historical Provider, MD  ezetimibe (ZETIA) 10 MG tablet Take 10 mg by mouth every evening.    Yes Historical Provider, MD  Glucos-Chond-Hyal Ac-Ca Fructo (MOVE FREE JOINT HEALTH ADVANCE PO) Take 1 capsule by mouth daily.   Yes Historical Provider, MD  L-Lysine 500 MG TABS Take 500 mg by mouth daily.   Yes Historical Provider, MD  Lutein-Zeaxanthin 25-5 MG CAPS Take 1 capsule by mouth daily.   Yes Historical Provider, MD  methenamine (HIPREX) 1 G tablet Take 0.5 g by mouth 2 (two) times daily with a meal.   Yes Historical Provider, MD  Multiple Vitamins-Minerals (MULTIVITAMIN WITH MINERALS) tablet Take 1 tablet by mouth every morning.    Yes Historical Provider, MD  Omega-3 Fatty Acids (FISH OIL PO) Take 1 capsule by mouth daily.    Yes Historical Provider, MD  Probiotic Product (PROBIOTIC DAILY PO) Take 1 capsule by mouth daily.    Yes Historical Provider, MD  Resveratrol 100 MG CAPS Take 100 mg by mouth daily.   Yes Historical Provider, MD  white petrolatum (VASELINE) GEL Apply 1 application topically See admin instructions. Applies to both eye lids before bed for prevention   Yes Historical Provider, MD  Zinc 50 MG CAPS Take 50 mg by mouth every evening.  Yes Historical Provider, MD    Family History No family history on file.  Social History Social History  Substance Use Topics  . Smoking status: Former Research scientist (life sciences)  . Smokeless tobacco: Former Systems developer  . Alcohol use Yes     Comment: "very little"     Allergies   Review of patient's allergies indicates no known allergies.   Review of Systems Review of Systems  Constitutional: Negative for chills and fever.  HENT: Negative for facial swelling and sore throat.   Respiratory: Negative for shortness of breath.   Cardiovascular: Negative for chest pain.  Gastrointestinal: Negative for abdominal pain, nausea and vomiting.  Genitourinary: Positive for hematuria and  urgency. Negative for dysuria.  Musculoskeletal: Negative for back pain.  Skin: Negative for rash and wound.  Neurological: Negative for headaches.  Psychiatric/Behavioral: The patient is not nervous/anxious.      Physical Exam Updated Vital Signs BP (!) 127/52 (BP Location: Right Arm)   Pulse 74   Temp 97.7 F (36.5 C) (Oral)   Resp 20   Ht 5\' 5"  (1.651 m)   Wt 58.7 kg   SpO2 100%   BMI 21.53 kg/m   Physical Exam  Constitutional: He appears well-developed and well-nourished. No distress.  HENT:  Head: Normocephalic and atraumatic.  Mouth/Throat: Oropharynx is clear and moist. No oropharyngeal exudate.  Eyes: Conjunctivae are normal. Pupils are equal, round, and reactive to light. Right eye exhibits no discharge. Left eye exhibits no discharge. No scleral icterus.  Neck: Normal range of motion. Neck supple. No thyromegaly present.  Cardiovascular: Normal rate, regular rhythm, normal heart sounds and intact distal pulses.  Exam reveals no gallop and no friction rub.   No murmur heard. Pulmonary/Chest: Effort normal and breath sounds normal. No stridor. No respiratory distress. He has no wheezes. He has no rales.  Abdominal: Soft. Bowel sounds are normal. He exhibits no distension. There is tenderness in the right lower quadrant and left lower quadrant. There is no rebound and no guarding.  Musculoskeletal: He exhibits no edema.  Lymphadenopathy:    He has no cervical adenopathy.  Neurological: He is alert. Coordination normal.  Skin: Skin is warm and dry. No rash noted. He is not diaphoretic. No pallor.  Psychiatric: He has a normal mood and affect.  Nursing note and vitals reviewed.    ED Treatments / Results  Labs (all labs ordered are listed, but only abnormal results are displayed) Labs Reviewed  CBC WITH DIFFERENTIAL/PLATELET - Abnormal; Notable for the following:       Result Value   WBC 10.8 (*)    RBC 3.24 (*)    Hemoglobin 9.4 (*)    HCT 30.3 (*)    Neutro  Abs 9.1 (*)    All other components within normal limits  COMPREHENSIVE METABOLIC PANEL - Abnormal; Notable for the following:    Glucose, Bld 102 (*)    BUN 27 (*)    Total Protein 5.7 (*)    Albumin 3.2 (*)    ALT 16 (*)    GFR calc non Af Amer 59 (*)    All other components within normal limits  URINALYSIS, ROUTINE W REFLEX MICROSCOPIC (NOT AT [facility]) - Abnormal; Notable for the following:    Color, Urine RED (*)    APPearance TURBID (*)    Hgb urine dipstick LARGE (*)    Ketones, ur 15 (*)    Protein, ur >300 (*)    Leukocytes, UA MODERATE (*)    All  other components within normal limits  URINE CULTURE  URINE CULTURE  URINE MICROSCOPIC-ADD ON  PSA  BASIC METABOLIC PANEL  CBC  TYPE AND SCREEN  ABO/RH    EKG  EKG Interpretation None       Radiology No results found.  Procedures Procedures (including critical care time)  Medications Ordered in ED Medications  ondansetron (ZOFRAN) injection 4 mg (not administered)  cholecalciferol (VITAMIN D) tablet 2,000 Units (2,000 Units Oral Given 07/11/16 1843)  loratadine (CLARITIN) tablet 10 mg (10 mg Oral Not Given 07/11/16 1500)  ezetimibe (ZETIA) tablet 10 mg (10 mg Oral Given 07/11/16 1843)  sodium chloride flush (NS) 0.9 % injection 3 mL (3 mLs Intravenous Not Given 07/11/16 1500)  0.9 %  sodium chloride infusion ( Intravenous New Bag/Given 07/11/16 1600)  acetaminophen (TYLENOL) tablet 650 mg (not administered)    Or  acetaminophen (TYLENOL) suppository 650 mg (not administered)  polyethylene glycol (MIRALAX / GLYCOLAX) packet 17 g (not administered)  cefTRIAXone (ROCEPHIN) 1 g in dextrose 5 % 50 mL IVPB (not administered)  sodium chloride 0.9 % bolus 1,000 mL (1,000 mLs Intravenous New Bag/Given 07/11/16 1502)     Initial Impression / Assessment and Plan / ED Course  I have reviewed the triage vital signs and the nursing notes.  Pertinent labs & imaging results that were available during my care of the patient were  reviewed by me and considered in my medical decision making (see chart for details).  Clinical Course   CBC shows hemoglobin 9.4, a significant drop from 12.7 on 05/31/2016; WBC 10.8. CMP shows glucose, BUN 27, protein 5.7, albumin 3.2, ALT 16. UA shows large hematuria, ketones 15, protein >300, moderate leukocytes, too numerous to count RBCs and WBCs, no bacteria seen. Urine culture sent. Type and screen O pos, negative Ab. I consulted [facility] and spoke with Dyanne Carrel who will admit the patient to telemetry observation. Transfer of care to attending, Dr. Denton Brick, for further evaluation and treatment. Santiago Glad requested consult to urology. I spoke with urologist, Dr. Alyson Ingles, who advised to begin treatment with Rocephin in the ED, and he would consult during patient admission.   Final Clinical Impressions(s) / ED Diagnoses   Final diagnoses:  Hematuria    New Prescriptions Current Discharge Medication List       Frederica Kuster, Hershal Coria 07/11/16 [place], MD 07/12/16 [phone]

## 2016-07-11 NOTE — Progress Notes (Signed)
Pt admitted to the unit at 1558. Pt mental status is A&Ox4. Pt oriented to room, staff, and call bell. Skin is intact except where otherwise charted. Full assessment charted in [facility]. Call bell within reach. Visitor guidelines reviewed w/ pt and/or family.

## 2016-07-11 NOTE — ED Triage Notes (Signed)
Patient complains of dysuria, urinary urgency and hematuria that started yesterday. Hx of same. States that he is unable to void and only self caths. No abdominal pain.

## 2016-07-11 NOTE — ED Notes (Signed)
Pt sts he would like to attempt to urinate prior to catheterization.  Will provide time to attempt

## 2016-07-11 NOTE — ED Notes (Signed)
Pt sts he self catheterizes

## 2016-07-11 NOTE — Progress Notes (Signed)
Foley catheter placed per MD order for hematuria. First attempt with 16 Fr unsuccessful with 2 RN assist. Second attempt with 14 Fr successful with 3 RN assist. Will continue to monitor.

## 2016-07-11 NOTE — ED Provider Notes (Signed)
Patient noted to have hematuria this morning. He's had it several times in the past. He presently complains of generalized weakness. No other associated symptoms. Denies pain anywhere. On exam alert no distress lungs clear auscultation heart regular rate and rhythm abdomen nondistended nontender. Genitalia normal male. Gait is normal. Not lightheaded on standing   Orlie Dakin, MD 07/11/16 1318

## 2016-07-11 NOTE — ED Notes (Signed)
Lab called and states unable to run urine sample due to "purely blood".  Will speak to PA about catheterization and/or future POC

## 2016-07-11 NOTE — H&P (Signed)
History and Physical    Trevor Phillips [id number] DOB: Jan 08, 1931 DOA: 07/11/2016  PCP: Mathews Argyle, MD Patient coming from: home Chief Complaint: hematuria/fatigue  HPI: Trevor Phillips is a delightful 82 y.o. male with medical history significant for prostate cancer 20 years ago, diverticular disease, UTI recent hematuria presents to the emergency Department chief complaint of worsening hematuria associated with generalized weakness increased fatigue and decreased stamina. Initial evaluation reveals acute blood loss anemia setting of gross hematuria.  Information is obtained from the patient. He states in July he came to the emergency department with the chief complaint of hematuria. He was discharged with an antibiotic and instructed to follow-up with his urologist. He reports he did so. He took the antibiotics and the hematuria "cleared up". He followed up with urology who he says gave him an additional antibiotic which she completed. He states during this timeframe hematuria would improve but never completely resolved. He continues with intermittent "drops". Of note patient has been self cathing since his prostate surgery 20 years ago. He also reports some clots and that his catheter were "get clogged up". Associated symptoms include increased fatigue less stamina and this morning he experienced diaphoresis. He denies headache visual disturbances shortness of breath chest pain palpitations syncope or near-syncope. He denies any abdominal pain nausea vomiting diarrhea constipation melena bright red blood per rectum. He denies any lower extremity edema orthopnea fever chills recent travel or sick contacts.     ED Course: In emergency department he is hemodynamically stable afebrile and not hypoxic. He is provided with IV fluids.  Review of Systems: As per HPI otherwise 10 point review of systems negative.   Ambulatory Status: He ambulates independently with a steady gait no recent  falls  Past Medical History:  Diagnosis Date  . Cancer of prostate ([place])   . Diverticular disease   . Hematuria   . Urinary retention with incomplete bladder emptying   . UTI (urinary tract infection)     Past Surgical History:  Procedure Laterality Date  . PROSTATE SURGERY      Social History   Social History  . Marital status: Married    Spouse name: N/A  . Number of children: N/A  . Years of education: N/A   Occupational History  . Not on file.   Social History Main Topics  . Smoking status: Former Research scientist (life sciences)  . Smokeless tobacco: Former Systems developer  . Alcohol use Yes     Comment: "very little"  . Drug use: No  . Sexual activity: Not on file   Other Topics Concern  . Not on file   Social History Narrative  . 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  No Known Allergies  No family history on file. Medical history is reviewed and noncontributory to the admission  Prior to Admission medications   Medication Sig Start Date End Date Taking? Authorizing Provider  aspirin EC 81 MG tablet Take 81 mg by mouth every morning.   Yes Historical Provider, MD  b complex vitamins tablet Take 1 tablet by mouth every morning.    Yes Historical Provider, MD  cetirizine (ZYRTEC) 10 MG tablet Take 10 mg by mouth every morning.    Yes Historical Provider, MD  Cholecalciferol (VITAMIN D3) 2000 UNITS TABS Take 2,000 Units by mouth daily.   Yes Historical Provider, MD  Coenzyme Q10 (CO Q-10) 200 MG CAPS Take 200 mg by mouth daily.  Yes Historical Provider, MD  ezetimibe (ZETIA) 10 MG tablet Take 10 mg by mouth every evening.    Yes Historical Provider, MD  Glucos-Chond-Hyal Ac-Ca Fructo (MOVE FREE JOINT HEALTH ADVANCE PO) Take 1 capsule by mouth daily.   Yes Historical Provider, MD  L-Lysine 500 MG TABS Take 500 mg by mouth daily.   Yes Historical Provider, MD  Lutein-Zeaxanthin 25-5 MG CAPS Take 1 capsule by mouth daily.   Yes Historical Provider,  MD  methenamine (HIPREX) 1 G tablet Take 0.5 g by mouth 2 (two) times daily with a meal.   Yes Historical Provider, MD  Multiple Vitamins-Minerals (MULTIVITAMIN WITH MINERALS) tablet Take 1 tablet by mouth every morning.    Yes Historical Provider, MD  Omega-3 Fatty Acids (FISH OIL PO) Take 1 capsule by mouth daily.    Yes Historical Provider, MD  Probiotic Product (PROBIOTIC DAILY PO) Take 1 capsule by mouth daily.    Yes Historical Provider, MD  Resveratrol 100 MG CAPS Take 100 mg by mouth daily.   Yes Historical Provider, MD  white petrolatum (VASELINE) GEL Apply 1 application topically See admin instructions. Applies to both eye lids before bed for prevention   Yes Historical Provider, MD  Zinc 50 MG CAPS Take 50 mg by mouth every evening.   Yes Historical Provider, MD    Physical Exam: Vitals:   07/11/16 1400 07/11/16 1430 07/11/16 1445 07/11/16 1500  BP: (!) 133/44 114/59 110/60 139/65  Pulse: 68 70 77 72  Resp:      Temp:      TempSrc:      SpO2: 99% 97% 99% 100%     General:  Appears calm and comfortable Slightly pale no acute distress  Eyes:  PERRL, EOMI, normal lids, iris ENT:  grossly normal hearing, lips & tongue, mucous membranes of the mouth are slightly pale slightly dry  Neck:  no LAD, masses or thyromegaly Cardiovascular:  RRR, no m/r/g. No LE edema. Pulses present and palpable  Respiratory:  CTA bilaterally, no w/r/r. Normal respiratory effort. Abdomen:  soft, ntnd, has a bowel sounds throughout no guarding or rebounding  Skin:  no rash or induration seen on limited exam Musculoskeletal:  grossly normal tone BUE/BLE, good ROM, no bony abnormality Psychiatric:  grossly normal mood and affect, speech fluent and appropriate, AOx3 Neurologic:  CN 2-12 grossly intact, moves all extremities in coordinated fashion, sensation intact  Labs on Admission: I have personally reviewed following labs and imaging studies  CBC:  Recent Labs Lab 07/11/16 1239  WBC 10.8*    NEUTROABS 9.1*  HGB 9.4*  HCT 30.3*  MCV 93.5  PLT [id number]   Basic Metabolic Panel:  Recent Labs Lab 07/11/16 1239  NA 137  K 3.8  CL 109  CO2 23  GLUCOSE 102*  BUN 27*  CREATININE 1.13  CALCIUM 9.0   GFR: CrCl cannot be calculated (Unknown ideal weight.). Liver Function Tests:  Recent Labs Lab 07/11/16 1239  AST 23  ALT 16*  ALKPHOS 55  BILITOT 0.9  PROT 5.7*  ALBUMIN 3.2*   No results for input(s): LIPASE, AMYLASE in the last 168 hours. No results for input(s): AMMONIA in the last 168 hours. Coagulation Profile: No results for input(s): INR, PROTIME in the last 168 hours. Cardiac Enzymes: No results for input(s): CKTOTAL, CKMB, CKMBINDEX, TROPONINI in the last 168 hours. BNP (last 3 results) No results for input(s): PROBNP in the last 8760 hours. HbA1C: No results for input(s): HGBA1C in the last 72  hours. CBG: No results for input(s): GLUCAP in the last 168 hours. Lipid Profile: No results for input(s): CHOL, HDL, LDLCALC, TRIG, CHOLHDL, LDLDIRECT in the last 72 hours. Thyroid Function Tests: No results for input(s): TSH, T4TOTAL, FREET4, T3FREE, THYROIDAB in the last 72 hours. Anemia Panel: No results for input(s): VITAMINB12, FOLATE, FERRITIN, TIBC, IRON, RETICCTPCT in the last 72 hours. Urine analysis:    Component Value Date/Time   COLORURINE RED (A) 07/11/2016 1414   APPEARANCEUR TURBID (A) 07/11/2016 1414   LABSPEC 1.025 07/11/2016 1414   PHURINE 6.0 07/11/2016 1414   GLUCOSEU NEGATIVE 07/11/2016 1414   HGBUR LARGE (A) 07/11/2016 1414   BILIRUBINUR NEGATIVE 07/11/2016 1414   KETONESUR 15 (A) 07/11/2016 1414   PROTEINUR >300 (A) 07/11/2016 1414   UROBILINOGEN 0.2 10/17/2013 1506   NITRITE NEGATIVE 07/11/2016 1414   LEUKOCYTESUR MODERATE (A) 07/11/2016 1414    Creatinine Clearance: CrCl cannot be calculated (Unknown ideal weight.).  Sepsis Labs: @LABRCNTIP (procalcitonin:4,lacticidven:4) )No results found for this or any previous visit  (from the past 240 hour(s)).   Radiological Exams on Admission: No results found.  EKG:   Assessment/Plan Principal Problem:   Acute blood loss anemia Active Problems:   Urinary retention with incomplete bladder emptying   Diverticular disease   Hematuria   1. Acute blood loss anemia likely related to persistent hematuria. Hemoglobin 9.4 on presentation. Chart review indicates hemoglobin 12.7 approximately 6 weeks ago. No dynamically stable and not hypoxic. Has been symptomatic over the last couple of days. -Admit to telemetry -Hold aspirin -Serial CBC -Consider transfusion if hemoglobin drops below 8.5 -Neurology consult  #2. Persistent hematuria in a patient with a history of urinary retention with incomplete bladder emptying has been self cathing for 20 years. 6 weeks ago patient treated for same with antibiotics 2. He reports hematuria "improved" but never resolved. Given rocephin in ED -Hold aspirin -Gentle IV fluids -Await urology consult -Obtain a PSA -Will likely need cystoscopy  #3. Urinary retention with incomplete bladder emptying. Patient has been self capping ever since his prostate surgery 20 years ago. -will put in foley -Await urology input   DVT prophylaxis: scd Code Status: full  Family Communication: none present  Disposition Plan: home when ready  Consults called: urology  Admission status: obs    Radene Gunning MD [facility]  If 7PM-7AM, please contact night-coverage [email or web address] Password [email or web address]  07/11/2016, 4:16 PM

## 2016-07-12 DIAGNOSIS — R339 Retention of urine, unspecified: Secondary | ICD-10-CM | POA: Diagnosis not present

## 2016-07-12 DIAGNOSIS — R319 Hematuria, unspecified: Secondary | ICD-10-CM | POA: Diagnosis not present

## 2016-07-12 DIAGNOSIS — D62 Acute posthemorrhagic anemia: Secondary | ICD-10-CM | POA: Diagnosis not present

## 2016-07-12 LAB — BASIC METABOLIC PANEL
ANION GAP: 4 — AB (ref 5–15)
BUN: 25 mg/dL — ABNORMAL HIGH (ref 6–20)
CHLORIDE: 110 mmol/L (ref 101–111)
CO2: 25 mmol/L (ref 22–32)
Calcium: 8.8 mg/dL — ABNORMAL LOW (ref 8.9–10.3)
Creatinine, Ser: 1.05 mg/dL (ref 0.61–1.24)
GFR calc non Af Amer: >60 mL/min (ref >60)
Glucose, Bld: 105 mg/dL — ABNORMAL HIGH (ref 65–99)
Potassium: 4.1 mmol/L (ref 3.5–5.1)
Sodium: 139 mmol/L (ref 135–145)

## 2016-07-12 LAB — CBC
HCT: 29.9 % — ABNORMAL LOW (ref 39.0–52.0)
HEMATOCRIT: 28.7 % — AB (ref 39.0–52.0)
HEMOGLOBIN: 9.3 g/dL — AB (ref 13.0–17.0)
HEMOGLOBIN: 9.1 g/dL — AB (ref 13.0–17.0)
MCH: 28.8 pg (ref 26.0–34.0)
MCH: 28.8 pg (ref 26.0–34.0)
MCHC: 31.1 g/dL (ref 30.0–36.0)
MCHC: 31.7 g/dL (ref 30.0–36.0)
MCV: 92.6 fL (ref 78.0–100.0)
MCV: 90.8 fL (ref 78.0–100.0)
Platelets: 237 10*3/uL (ref 150–400)
Platelets: 229 10*3/uL (ref 150–400)
RBC: 3.23 MIL/uL — AB (ref 4.22–5.81)
RBC: 3.16 MIL/uL — ABNORMAL LOW (ref 4.22–5.81)
RDW: 13.2 % (ref 11.5–15.5)
RDW: 13.3 % (ref 11.5–15.5)
WBC: 10.3 10*3/uL (ref 4.0–10.5)
WBC: 10.6 10*3/uL — AB (ref 4.0–10.5)

## 2016-07-12 MED ORDER — AMPICILLIN SODIUM 1 G IJ SOLR
1.0000 g | Freq: Four times a day (QID) | INTRAMUSCULAR | Status: DC
  Administered 2016-07-12 – 2016-07-13 (×4): 1 g via INTRAVENOUS
  Filled 2016-07-12 (×5): qty 1000

## 2016-07-12 NOTE — Progress Notes (Signed)
  Subjective: Patient reports he feels better. Hematuria clearing. He has a 14 Fr foley catheter. Draining well. No clots. Cx growing enterococcus which was resistant to rocephin Feb 2017.   Objective: Vital signs in last 24 hours: Temp:  [97.6 F (36.4 C)-98.2 F (36.8 C)] 97.7 F (36.5 C) (09/03 1513) Pulse Rate:  [59-74] 65 (09/03 1513) Resp:  [18-20] 20 (09/03 1513) BP: (114-171)/(52-68) 123/53 (09/03 1513) SpO2:  [96 %-100 %] 99 % (09/03 1513) Weight:  [58.7 kg (129 lb 6.4 oz)] 58.7 kg (129 lb 6.4 oz) (09/02 1845)  Intake/Output from previous day: 09/02 0701 - 09/03 0700 In: 220 [P.O.:220] Out: 300 [Urine:300] Intake/Output this shift: Total I/O In: -  Out: 1250 [Urine:1250]  Physical Exam:  General: NAd, A&Ox3 GU - 14Fr foley in place   Procedure: foley irrigated with 1L [place] to clear. No clots. Equal return.   Lab Results:  Recent Labs  07/11/16 1239 07/12/16 0516  HGB 9.4* 9.3*  HCT 30.3* 29.9*   BMET  Recent Labs  07/11/16 1239 07/12/16 0516  NA 137 139  K 3.8 4.1  CL 109 110  CO2 23 25  GLUCOSE 102* 105*  BUN 27* 25*  CREATININE 1.13 1.05  CALCIUM 9.0 8.8*   No results for input(s): LABPT, INR in the last 72 hours. No results for input(s): LABURIN in the last 72 hours. Results for orders placed or performed during the hospital encounter of 07/11/16  Urine culture     Status: Abnormal (Preliminary result)   Collection Time: 07/11/16  2:14 PM  Result Value Ref Range Status   Specimen Description URINE, CATHETERIZED  Final   Special Requests Normal  Final   Culture 50,000 COLONIES/mL ENTEROCOCCUS FAECALIS (A)  Final   Report Status PENDING  Incomplete    Studies/Results: No results found.  Assessment/Plan: -gross hematuria - unclear etiology but recurrent. Possible from UTI - cx growing enterococcus. I could not access bladder in May with cystoscope due to bladder neck contracture ([place]). We discussed formal cysto in OR, but I will likely  need to dilate and / or incise BNC and this can lead to worsening incontinence. He would like to proceed with cysto in OR. When sensitivities return, d/c pt with two weeks po abx and with foley. I'll set up for cysto in OR week of 9/11.   Discussed with Dr. Coralyn Pear.    LOS: 0 days   Trevor Phillips 07/12/2016, 5:46 PM

## 2016-07-12 NOTE — Progress Notes (Signed)
PROGRESS NOTE    Trevor Phillips  [id number] DOB: [birth year] DOA: 07/11/2016 PCP: Mathews Argyle, MD   Brief Narrative:  82 year old with a past medical history of prostate cancer, recurrent urinary tract infections, history of hematuria, admitted to the medicine service on 07/11/2016 when he presented with complaints of worsening hematuria. Prior to this hospitalization he had been performing self catheterizations with his history of urinary retention. Initial labs showing hemoglobin of 9.4, previously 12.7 on 05/31/2016. He was seen and evaluated by urology recommended performing self Every 3-4 Hours, If Unable to drain recommended proceeding with placement of a three-way full catheter and starting continuous bladder irrigation. Meanwhile started ceftriaxone for urinary tract infection.   Assessment & Plan:   Principal Problem:   Acute blood loss anemia Active Problems:   Urinary retention with incomplete bladder emptying   Diverticular disease   Hematuria  1.   Hematuria -Trevor Phillips is an 82 year old gentleman with a history of prostate cancer, urinary retention who undergoes self catheterizations, presented with complaints of worsening hematuria. -Lab work on admission showing hemoglobin of 9.4 with hematocrit of 30.3, down from 12.7 and 38.9 respectively on 05/31/2016 -Hematuria could be related to prostate cancer, urinary tract infection and perhaps trauma from self catheterizations -He was seen and evaluated by urology overnight recommending placing three-way Foley catheter and starting continuous bladder irrigation if unable to drain urine. -Plan for outpatient cystoscopy  2.  Urinary tract infection - Urinalysis performed on admission showed the presence of white blood cells -Started on ceftriaxone 1 g IV every 24 hours  3.  Acute blood loss anemia -Presented with gross hematuria, lab work showing hemoglobin of 9.4 on presentation, repeat lab work showing stable  hemoglobin of 9.3. Aspirin was held on admission. -Continue to monitor hemoglobin   DVT prophylaxis: SCD's Code Status: Full code Family Communication:  Disposition Plan:   Consultants:   Urology  Procedures:     Antimicrobials:   Ceftriaxone  Subjective: Trevor Phillips reports ongoing hematuria, tolerated breakfast  Objective: Vitals:   07/11/16 2301 07/12/16 0205 07/12/16 0535 07/12/16 0600  BP: (!) 114/55 (!) 143/68 (!) 171/59 (!) 148/66  Pulse: 65 69 (!) 59 65  Resp: 18 20 18 18   Temp: 98.1 F (36.7 C) 98.2 F (36.8 C) 97.6 F (36.4 C) 98 F (36.7 C)  TempSrc: Oral Oral Oral Oral  SpO2: 98% 96% 96% 98%  Weight:      Height:        Intake/Output Summary (Last 24 hours) at 07/12/16 1312 Last data filed at 07/12/16 1226  Gross per 24 hour  Intake              220 ml  Output             1550 ml  Net            -1330 ml   Filed Weights   07/11/16 1845  Weight: 58.7 kg (129 lb 6.4 oz)    Examination:  General exam: Appears calm and comfortable, Nontoxic appearing Respiratory system: Clear to auscultation. Respiratory effort normal. Cardiovascular system: S1 & S2 heard, RRR. No JVD, murmurs, rubs, gallops or clicks. No pedal edema. Gastrointestinal system: Abdomen is nondistended, soft and nontender. No organomegaly or masses felt. Normal bowel sounds heard. Central nervous system: Alert and oriented. No focal neurological deficits. Extremities: Symmetric 5 x 5 power. Skin: No rashes, lesions or ulcers Psychiatry: Judgement and insight appear normal. Mood & affect appropriate.  Genitourinary:  Foley catheter in place, draining grossly bloody urine    Data Reviewed: I have personally reviewed following labs and imaging studies  CBC:  Recent Labs Lab 07/11/16 1239 07/12/16 0516  WBC 10.8* 10.3  NEUTROABS 9.1*  --   HGB 9.4* 9.3*  HCT 30.3* 29.9*  MCV 93.5 92.6  PLT 232 [id number]   Basic Metabolic Panel:  Recent Labs Lab 07/11/16 1239 07/12/16 0516    NA 137 139  K 3.8 4.1  CL 109 110  CO2 23 25  GLUCOSE 102* 105*  BUN 27* 25*  CREATININE 1.13 1.05  CALCIUM 9.0 8.8*   GFR: Estimated Creatinine Clearance: 45 mL/min (by C-G formula based on SCr of 1.05 mg/dL). Liver Function Tests:  Recent Labs Lab 07/11/16 1239  AST 23  ALT 16*  ALKPHOS 55  BILITOT 0.9  PROT 5.7*  ALBUMIN 3.2*   No results for input(s): LIPASE, AMYLASE in the last 168 hours. No results for input(s): AMMONIA in the last 168 hours. Coagulation Profile: No results for input(s): INR, PROTIME in the last 168 hours. Cardiac Enzymes: No results for input(s): CKTOTAL, CKMB, CKMBINDEX, TROPONINI in the last 168 hours. BNP (last 3 results) No results for input(s): PROBNP in the last 8760 hours. HbA1C: No results for input(s): HGBA1C in the last 72 hours. CBG: No results for input(s): GLUCAP in the last 168 hours. Lipid Profile: No results for input(s): CHOL, HDL, LDLCALC, TRIG, CHOLHDL, LDLDIRECT in the last 72 hours. Thyroid Function Tests: No results for input(s): TSH, T4TOTAL, FREET4, T3FREE, THYROIDAB in the last 72 hours. Anemia Panel: No results for input(s): VITAMINB12, FOLATE, FERRITIN, TIBC, IRON, RETICCTPCT in the last 72 hours. Sepsis Labs: No results for input(s): PROCALCITON, LATICACIDVEN in the last 168 hours.  Recent Results (from the past 240 hour(s))  Urine culture     Status: None (Preliminary result)   Collection Time: 07/11/16  2:14 PM  Result Value Ref Range Status   Specimen Description URINE, CATHETERIZED  Final   Special Requests Normal  Final   Culture CULTURE REINCUBATED FOR BETTER GROWTH  Final   Report Status PENDING  Incomplete         Radiology Studies: No results found.      Scheduled Meds: . cholecalciferol  2,000 Units Oral Daily  . ezetimibe  10 mg Oral QPM  . loratadine  10 mg Oral Daily  . sodium chloride flush  3 mL Intravenous Q12H   Continuous Infusions:    LOS: 0 days    Time spent: 30  min    Kelvin Cellar, MD [facility] Pager [phone]  If 7PM-7AM, please contact night-coverage [email or web address] Password [email or web address] 07/12/2016, 1:12 PM

## 2016-07-13 DIAGNOSIS — R339 Retention of urine, unspecified: Secondary | ICD-10-CM | POA: Diagnosis not present

## 2016-07-13 DIAGNOSIS — N3001 Acute cystitis with hematuria: Secondary | ICD-10-CM | POA: Diagnosis not present

## 2016-07-13 MED ORDER — AMPICILLIN 500 MG PO CAPS: 500.0000 mg | ORAL_CAPSULE | Freq: Three times a day (TID) | ORAL | 0 refills | Status: DC

## 2016-07-13 MED ORDER — WHITE PETROLATUM GEL
Status: AC
  Administered 2016-07-13: 13:00:00
  Filled 2016-07-13: qty 1

## 2016-07-13 NOTE — Discharge Summary (Signed)
Physician Discharge Summary  Trevor Phillips [id number] DOB: 07-02-1931 DOA: 07/11/2016  PCP: Mathews Argyle, MD  Admit date: 07/11/2016 Discharge date: 07/13/2016  Time spent: 35 minutes  Recommendations for Outpatient Follow-up:  1. Trevor Phillips urine cultures grew Enterococcus faecalis, organism sensitive to ampicillin, levaquin, nitrofurantoin and vancomycin. He was discharged on a 14 week course of ampicillin 500 mg po TID as recommended by urology.  2. Urology will perform cystoscopy in the OR which will be set up week of 9/11 3. He was discharged with an indwelling foley catheter.    Discharge Diagnoses:  Principal Problem:   Acute blood loss anemia Active Problems:   Urinary retention with incomplete bladder emptying   Diverticular disease   Hematuria   Discharge Condition: Stable  Diet recommendation: Regular Diet  Filed Weights   07/11/16 1845  Weight: 58.7 kg (129 lb 6.4 oz)    History of present illness:  Trevor Phillips is a delightful 82 y.o. male with medical history significant for prostate cancer 20 years ago, diverticular disease, UTI recent hematuria presents to the emergency Department chief complaint of worsening hematuria associated with generalized weakness increased fatigue and decreased stamina. Initial evaluation reveals acute blood loss anemia setting of gross hematuria.  Information is obtained from the patient. He states in July he came to the emergency department with the chief complaint of hematuria. He was discharged with an antibiotic and instructed to follow-up with his urologist. He reports he did so. He took the antibiotics and the hematuria "cleared up". He followed up with urology who he says gave him an additional antibiotic which she completed. He states during this timeframe hematuria would improve but never completely resolved. He continues with intermittent "drops". Of note patient has been self cathing since his prostate surgery 20 years  ago. He also reports some clots and that his catheter were "get clogged up". Associated symptoms include increased fatigue less stamina and this morning he experienced diaphoresis. He denies headache visual disturbances shortness of breath chest pain palpitations syncope or near-syncope. He denies any abdominal pain nausea vomiting diarrhea constipation melena bright red blood per rectum. He denies any lower extremity edema orthopnea fever chills recent travel or sick contacts.   Hospital Course:  82 year old with a past medical history of prostate cancer, recurrent urinary tract infections, history of hematuria, admitted to the medicine service on 07/11/2016 when he presented with complaints of worsening hematuria. Prior to this hospitalization he had been performing self catheterizations with his history of urinary retention. Initial labs showing hemoglobin of 9.4, previously 12.7 on 05/31/2016. He was seen and evaluated by urology recommended performing self Every 3-4 Hours, If Unable to drain recommended proceeding with placement of a three-way full catheter and starting continuous bladder irrigation. Meanwhile started ceftriaxone for urinary tract infection. Urine cultures growing Enterococcus faecalis for which his antibiotic regimen was changed to Ampicillin IV. Culture's growing Enterococcus faecalis which was sensitive to ampicillin, levaquin, nitrofurantoin and vancomycin. Given clinical stability he was discharged on 07/13/2016 to his home, given a script for ampicillin 500 mg PO TID for 2 weeks as recommended by Urology. He will be set up for cystoscopy on the week 9/11. Discharged on indwelling foley cath.   Procedures:  Foley Cath placement  Consultations:  Urology  Discharge Exam: Vitals:   07/12/16 2200 07/13/16 0550  BP: (!) 148/86 (!) 148/81  Pulse: 69 73  Resp: 16 18  Temp: 98.1 F (36.7 C) 97.9 F (36.6 C)  General exam: Appears calm and comfortable, Nontoxic  appearing Respiratory system: Clear to auscultation. Respiratory effort normal. Cardiovascular system: S1 & S2 heard, RRR. No JVD, murmurs, rubs, gallops or clicks. No pedal edema. Gastrointestinal system: Abdomen is nondistended, soft and nontender. No organomegaly or masses felt. Normal bowel sounds heard. Central nervous system: Alert and oriented. No focal neurological deficits. Extremities: Symmetric 5 x 5 power. Skin: No rashes, lesions or ulcers Psychiatry: Judgement and insight appear normal. Mood & affect appropriate.  Genitourinary: Foley catheter in place, draining grossly bloody urine  Discharge Instructions   Discharge Instructions    Call MD for:    Complete by:  As directed   Call MD for:  difficulty breathing, headache or visual disturbances    Complete by:  As directed   Call MD for:  extreme fatigue    Complete by:  As directed   Call MD for:  hives    Complete by:  As directed   Call MD for:  persistant dizziness or light-headedness    Complete by:  As directed   Call MD for:  persistant nausea and vomiting    Complete by:  As directed   Call MD for:  redness, tenderness, or signs of infection (pain, swelling, redness, odor or green/yellow discharge around incision site)    Complete by:  As directed   Call MD for:  severe uncontrolled pain    Complete by:  As directed   Call MD for:  temperature >100.4    Complete by:  As directed   Diet - low sodium heart healthy    Complete by:  As directed   Increase activity slowly    Complete by:  As directed     Current Discharge Medication List    START taking these medications   Details  ampicillin (PRINCIPEN) 500 MG capsule Take 1 capsule (500 mg total) by mouth 3 (three) times daily. Qty: 42 capsule, Refills: 0      CONTINUE these medications which have NOT CHANGED   Details  b complex vitamins tablet Take 1 tablet by mouth every morning.     cetirizine (ZYRTEC) 10 MG tablet Take 10 mg by mouth every morning.      Cholecalciferol (VITAMIN D3) 2000 UNITS TABS Take 2,000 Units by mouth daily.    Coenzyme Q10 (CO Q-10) 200 MG CAPS Take 200 mg by mouth daily.    ezetimibe (ZETIA) 10 MG tablet Take 10 mg by mouth every evening.     Glucos-Chond-Hyal Ac-Ca Fructo (MOVE FREE JOINT HEALTH ADVANCE PO) Take 1 capsule by mouth daily.    L-Lysine 500 MG TABS Take 500 mg by mouth daily.    Lutein-Zeaxanthin 25-5 MG CAPS Take 1 capsule by mouth daily.    methenamine (HIPREX) 1 G tablet Take 0.5 g by mouth 2 (two) times daily with a meal.    Multiple Vitamins-Minerals (MULTIVITAMIN WITH MINERALS) tablet Take 1 tablet by mouth every morning.     Omega-3 Fatty Acids (FISH OIL PO) Take 1 capsule by mouth daily.     Probiotic Product (PROBIOTIC DAILY PO) Take 1 capsule by mouth daily.     Resveratrol 100 MG CAPS Take 100 mg by mouth daily.    white petrolatum (VASELINE) GEL Apply 1 application topically See admin instructions. Applies to both eye lids before bed for prevention    Zinc 50 MG CAPS Take 50 mg by mouth every evening.      STOP taking these medications  aspirin EC 81 MG tablet        No Known Allergies    The results of significant diagnostics from this hospitalization (including imaging, microbiology, ancillary and laboratory) are listed below for reference.    Significant Diagnostic Studies: No results found.  Microbiology: Recent Results (from the past 240 hour(s))  Urine culture     Status: Abnormal (Preliminary result)   Collection Time: 07/11/16  2:14 PM  Result Value Ref Range Status   Specimen Description URINE, CATHETERIZED  Final   Special Requests Normal  Final   Culture 50,000 COLONIES/mL ENTEROCOCCUS FAECALIS (A)  Final   Report Status PENDING  Incomplete   Organism ID, Bacteria ENTEROCOCCUS FAECALIS (A)  Final      Susceptibility   Enterococcus faecalis - MIC*    AMPICILLIN <=2 SENSITIVE Sensitive     LEVOFLOXACIN 0.5 SENSITIVE Sensitive     NITROFURANTOIN  <=16 SENSITIVE Sensitive     VANCOMYCIN 1 SENSITIVE Sensitive     * 50,000 COLONIES/mL ENTEROCOCCUS FAECALIS     Labs: Basic Metabolic Panel:  Recent Labs Lab 07/11/16 1239 07/12/16 0516  NA 137 139  K 3.8 4.1  CL 109 110  CO2 23 25  GLUCOSE 102* 105*  BUN 27* 25*  CREATININE 1.13 1.05  CALCIUM 9.0 8.8*   Liver Function Tests:  Recent Labs Lab 07/11/16 1239  AST 23  ALT 16*  ALKPHOS 55  BILITOT 0.9  PROT 5.7*  ALBUMIN 3.2*   No results for input(s): LIPASE, AMYLASE in the last 168 hours. No results for input(s): AMMONIA in the last 168 hours. CBC:  Recent Labs Lab 07/11/16 1239 07/12/16 0516 07/12/16 1935  WBC 10.8* 10.3 10.6*  NEUTROABS 9.1*  --   --   HGB 9.4* 9.3* 9.1*  HCT 30.3* 29.9* 28.7*  MCV 93.5 92.6 90.8  PLT 232 237 229   Cardiac Enzymes: No results for input(s): CKTOTAL, CKMB, CKMBINDEX, TROPONINI in the last 168 hours. BNP: BNP (last 3 results) No results for input(s): BNP in the last 8760 hours.  ProBNP (last 3 results) No results for input(s): PROBNP in the last 8760 hours.  CBG: No results for input(s): GLUCAP in the last 168 hours.     Signed:  Kelvin Cellar MD.  [facility] 07/13/2016, 12:24 PM

## 2016-07-13 NOTE — Care Management Note (Signed)
Case Management Note  Patient Details  Name: Trevor Phillips MRN: [id number] Date of Birth: 10-Sep-1931  Subjective/Objective:                 Patient in obs for bloody urine, anemia. From home with wife. No CM needs identified.   Action/Plan:  DC to home today with family.   Expected Discharge Date:                  Expected Discharge Plan:  Home/Self Care  In-House Referral:  NA  Discharge planning Services  NA  Post Acute Care Choice:  NA Choice offered to:  NA  DME Arranged:  N/A DME Agency:  NA  HH Arranged:  NA HH Agency:  NA  Status of Service:  Completed, signed off  If discussed at [place] of Stay Meetings, dates discussed:    Additional Comments:  Carles Collet, RN 07/13/2016, 12:23 PM

## 2016-07-13 NOTE — Care Management Obs Status (Signed)
[place] NOTIFICATION   Patient Details  Name: LAKEITH SHAMBAUGH MRN: [id number] Date of Birth: 15-Dec-1930   Medicare Observation Status Notification Given:  Yes    Carles Collet, RN 07/13/2016, 10:51 AM

## 2016-07-13 NOTE — Progress Notes (Signed)
Pt.c/o pressure on his foley cath.;called MD on call &ordred to irrigate the foley once

## 2016-07-13 NOTE — Progress Notes (Signed)
  Pharmacy Antibiotic Note  Trevor Phillips is a 82 y.o. male admitted on 07/11/2016 with hematuria now being treated for UTI.  Pharmacy has been consulted for ampicillin dosing.  Plan: -Ampicllin 1g q6H  -Monitor renal function -Per urology note on 9/3, plan is 14 days of treatment with PO abx on d/c   Height: 5\' 5"  (165.1 cm) Weight: 129 lb 6.4 oz (58.7 kg) IBW/kg (Calculated) : 61.5  Temp (24hrs), Avg:97.9 F (36.6 C), Min:97.7 F (36.5 C), Max:98.1 F (36.7 C)   Recent Labs Lab 07/11/16 1239 07/12/16 0516 07/12/16 1935  WBC 10.8* 10.3 10.6*  CREATININE 1.13 1.05  --     Estimated Creatinine Clearance: 45 mL/min (by C-G formula based on SCr of 1.05 mg/dL).    No Known Allergies  Antimicrobials this admission: -CTX x1 on 9/2 -Amp 9/3 >>  Dose adjustments this admission: N/A  Microbiology results: -9/2 UCx: 50,000 enterococcus (panS)   Thank you for allowing pharmacy to be a part of this patient's care.  Myer Peer Grayland Ormond), PharmD  PGY1 Pharmacy Resident Pager: [phone] 07/13/2016 8:46 AM

## 2016-07-16 ENCOUNTER — Encounter (HOSPITAL_BASED_OUTPATIENT_CLINIC_OR_DEPARTMENT_OTHER): Payer: Self-pay | Admitting: *Deleted

## 2016-07-16 ENCOUNTER — Other Ambulatory Visit: Payer: Self-pay | Admitting: Urology

## 2016-07-16 LAB — URINE CULTURE
Culture: 50000 — AB
Special Requests: NORMAL

## 2016-07-16 NOTE — Progress Notes (Signed)
NPO AFTER MN.  ARRIVE AT 0600.  CURRENT LAB RESULTS IN CHART AND EPIC.   

## 2016-07-20 NOTE — Anesthesia Preprocedure Evaluation (Addendum)
Anesthesia Evaluation  Patient identified by MRN, date of birth, ID band Patient awake    Reviewed: Allergy & Precautions, H&P , NPO status , Patient's Chart, lab work & pertinent test results  History of Anesthesia Complications Negative for: history of anesthetic complications  Airway Mallampati: II  TM Distance: >3 FB Neck ROM: full    Dental no notable dental hx. (+) Dental Advisory Given, Teeth Intact, Caps,    Pulmonary former smoker,    Pulmonary exam normal breath sounds clear to auscultation       Cardiovascular negative cardio ROS Normal cardiovascular exam Rhythm:regular Rate:Normal     Neuro/Psych negative neurological ROS     GI/Hepatic negative GI ROS, Neg liver ROS,   Endo/Other  negative endocrine ROS  Renal/GU negative Renal ROS     Musculoskeletal  (+) Arthritis , Osteoarthritis,    Abdominal   Peds  Hematology  (+) anemia ,   Anesthesia Other Findings Prostate cancer  Reproductive/Obstetrics negative OB ROS                           Anesthesia Physical Anesthesia Plan  ASA: II  Anesthesia Plan: General   Post-op Pain Management:    Induction: Intravenous  Airway Management Planned: LMA  Additional Equipment:   Intra-op Plan:   Post-operative Plan: Extubation in OR  Informed Consent: I have reviewed the patients History and Physical, chart, labs and discussed the procedure including the risks, benefits and alternatives for the proposed anesthesia with the patient or authorized representative who has indicated his/her understanding and acceptance.   Dental Advisory Given  Plan Discussed with: Anesthesiologist, CRNA and Surgeon  Anesthesia Plan Comments:         Anesthesia Quick Evaluation

## 2016-07-21 ENCOUNTER — Ambulatory Visit (HOSPITAL_BASED_OUTPATIENT_CLINIC_OR_DEPARTMENT_OTHER)
Admission: RE | Admit: 2016-07-21 | Discharge: 2016-07-21 | Disposition: A | Discharge status: Home / Self Care | Payer: Medicare Other | Source: Ambulatory Visit | Attending: Urology | Admitting: Urology

## 2016-07-21 ENCOUNTER — Ambulatory Visit (HOSPITAL_BASED_OUTPATIENT_CLINIC_OR_DEPARTMENT_OTHER): Payer: Medicare Other | Admitting: Anesthesiology

## 2016-07-21 ENCOUNTER — Encounter (HOSPITAL_BASED_OUTPATIENT_CLINIC_OR_DEPARTMENT_OTHER): Payer: Self-pay | Admitting: *Deleted

## 2016-07-21 ENCOUNTER — Encounter (HOSPITAL_BASED_OUTPATIENT_CLINIC_OR_DEPARTMENT_OTHER)
Admission: RE | Disposition: A | Discharge status: Home / Self Care | Payer: Self-pay | Source: Ambulatory Visit | Attending: Urology

## 2016-07-21 DIAGNOSIS — Z8673 Personal history of transient ischemic attack (TIA), and cerebral infarction without residual deficits: Secondary | ICD-10-CM | POA: Insufficient documentation

## 2016-07-21 DIAGNOSIS — N3011 Interstitial cystitis (chronic) with hematuria: Secondary | ICD-10-CM | POA: Diagnosis not present

## 2016-07-21 DIAGNOSIS — M1990 Unspecified osteoarthritis, unspecified site: Secondary | ICD-10-CM | POA: Insufficient documentation

## 2016-07-21 DIAGNOSIS — R1310 Dysphagia, unspecified: Secondary | ICD-10-CM | POA: Diagnosis present

## 2016-07-21 DIAGNOSIS — Z87891 Personal history of nicotine dependence: Secondary | ICD-10-CM | POA: Diagnosis not present

## 2016-07-21 DIAGNOSIS — N320 Bladder-neck obstruction: Secondary | ICD-10-CM | POA: Insufficient documentation

## 2016-07-21 DIAGNOSIS — N312 Flaccid neuropathic bladder, not elsewhere classified: Secondary | ICD-10-CM | POA: Insufficient documentation

## 2016-07-21 HISTORY — DX: Personal history of other specified conditions: Z87.898

## 2016-07-21 HISTORY — DX: Personal history of malignant neoplasm of prostate: Z85.46

## 2016-07-21 HISTORY — DX: Presence of urogenital implants: Z96.0

## 2016-07-21 HISTORY — DX: Neuromuscular dysfunction of bladder, unspecified: N31.9

## 2016-07-21 HISTORY — DX: Bladder-neck obstruction: N32.0

## 2016-07-21 HISTORY — DX: Personal history of transient ischemic attack (TIA), and cerebral infarction without residual deficits: Z86.73

## 2016-07-21 HISTORY — DX: Personal history of urinary (tract) infections: Z87.440

## 2016-07-21 HISTORY — DX: Diverticulosis of large intestine without perforation or abscess without bleeding: K57.30

## 2016-07-21 HISTORY — DX: Presence of spectacles and contact lenses: Z97.3

## 2016-07-21 HISTORY — DX: Other specified health status: Z78.9

## 2016-07-21 HISTORY — DX: Personal history of other diseases of the digestive system: Z87.19

## 2016-07-21 HISTORY — DX: Gross hematuria: R31.0

## 2016-07-21 HISTORY — PX: CYSTOSCOPY W/ RETROGRADES: SHX1426

## 2016-07-21 HISTORY — DX: Presence of other specified devices: Z97.8

## 2016-07-21 HISTORY — DX: Acute posthemorrhagic anemia: D62

## 2016-07-21 HISTORY — DX: Retention of urine, unspecified: R33.9

## 2016-07-21 HISTORY — DX: Personal history of colonic polyps: Z86.010

## 2016-07-21 SURGERY — CYSTOSCOPY, WITH RETROGRADE PYELOGRAM
Anesthesia: General | Site: Bladder

## 2016-07-21 MED ORDER — PROPOFOL 10 MG/ML IV BOLUS
INTRAVENOUS | Status: DC | PRN
  Administered 2016-07-21: 140 mg via INTRAVENOUS

## 2016-07-21 MED ORDER — PHENYLEPHRINE 40 MCG/ML (10ML) SYRINGE FOR IV PUSH (FOR BLOOD PRESSURE SUPPORT)
PREFILLED_SYRINGE | INTRAVENOUS | Status: AC
  Filled 2016-07-21: qty 10

## 2016-07-21 MED ORDER — AMPICILLIN-SULBACTAM SODIUM 1.5 (1-0.5) G IJ SOLR
INTRAMUSCULAR | Status: AC
  Filled 2016-07-21: qty 1.5

## 2016-07-21 MED ORDER — EPHEDRINE SULFATE 50 MG/ML IJ SOLN
INTRAMUSCULAR | Status: DC | PRN
  Administered 2016-07-21 (×3): 10 mg via INTRAVENOUS

## 2016-07-21 MED ORDER — EPHEDRINE 5 MG/ML INJ
INTRAVENOUS | Status: AC
  Filled 2016-07-21: qty 10

## 2016-07-21 MED ORDER — FLUORESCEIN SODIUM 10 % IV SOLN
INTRAVENOUS | Status: DC | PRN
  Administered 2016-07-21: 300 mg via INTRAVENOUS

## 2016-07-21 MED ORDER — STERILE WATER FOR IRRIGATION IR SOLN
Status: DC | PRN
  Administered 2016-07-21: 3000 mL
  Administered 2016-07-21: 500 mL
  Administered 2016-07-21 (×2): 3000 mL

## 2016-07-21 MED ORDER — ONDANSETRON HCL 4 MG/2ML IJ SOLN
INTRAMUSCULAR | Status: AC
  Filled 2016-07-21: qty 2

## 2016-07-21 MED ORDER — LIDOCAINE HCL 2 % EX GEL
CUTANEOUS | Status: DC | PRN
  Administered 2016-07-21: 1 via URETHRAL

## 2016-07-21 MED ORDER — PROPOFOL 10 MG/ML IV BOLUS
INTRAVENOUS | Status: AC
  Filled 2016-07-21: qty 20

## 2016-07-21 MED ORDER — ONDANSETRON HCL 4 MG/2ML IJ SOLN
4.0000 mg | Freq: Once | INTRAMUSCULAR | Status: DC | PRN
  Filled 2016-07-21: qty 2

## 2016-07-21 MED ORDER — FENTANYL CITRATE (PF) 100 MCG/2ML IJ SOLN
INTRAMUSCULAR | Status: DC | PRN
  Administered 2016-07-21: 25 ug via INTRAVENOUS
  Administered 2016-07-21: 50 ug via INTRAVENOUS

## 2016-07-21 MED ORDER — DEXAMETHASONE SODIUM PHOSPHATE 10 MG/ML IJ SOLN
INTRAMUSCULAR | Status: AC
  Filled 2016-07-21: qty 1

## 2016-07-21 MED ORDER — SODIUM CHLORIDE 0.9 % IV SOLN
INTRAVENOUS | Status: AC
  Filled 2016-07-21: qty 50

## 2016-07-21 MED ORDER — DEXAMETHASONE SODIUM PHOSPHATE 4 MG/ML IJ SOLN
INTRAMUSCULAR | Status: DC | PRN
  Administered 2016-07-21: 10 mg via INTRAVENOUS

## 2016-07-21 MED ORDER — ONDANSETRON HCL 4 MG/2ML IJ SOLN
INTRAMUSCULAR | Status: DC | PRN
  Administered 2016-07-21: 4 mg via INTRAVENOUS

## 2016-07-21 MED ORDER — FENTANYL CITRATE (PF) 100 MCG/2ML IJ SOLN
25.0000 ug | INTRAMUSCULAR | Status: DC | PRN
  Filled 2016-07-21: qty 1

## 2016-07-21 MED ORDER — LIDOCAINE 2% (20 MG/ML) 5 ML SYRINGE
INTRAMUSCULAR | Status: DC | PRN
  Administered 2016-07-21: 80 mg via INTRAVENOUS

## 2016-07-21 MED ORDER — IOHEXOL 300 MG/ML  SOLN
INTRAMUSCULAR | Status: DC | PRN
  Administered 2016-07-21: 20 mL

## 2016-07-21 MED ORDER — FENTANYL CITRATE (PF) 100 MCG/2ML IJ SOLN
INTRAMUSCULAR | Status: AC
  Filled 2016-07-21: qty 2

## 2016-07-21 MED ORDER — LIDOCAINE 2% (20 MG/ML) 5 ML SYRINGE
INTRAMUSCULAR | Status: AC
  Filled 2016-07-21: qty 5

## 2016-07-21 MED ORDER — LACTATED RINGERS IV SOLN
INTRAVENOUS | Status: DC
  Administered 2016-07-21: 07:00:00 via INTRAVENOUS
  Filled 2016-07-21: qty 1000

## 2016-07-21 MED ORDER — SODIUM CHLORIDE 0.9 % IV SOLN
1.5000 g | INTRAVENOUS | Status: AC
  Administered 2016-07-21: 1.5 g via INTRAVENOUS
  Filled 2016-07-21: qty 1.5

## 2016-07-21 SURGICAL SUPPLY — 33 items
BAG DRAIN URO-CYSTO SKYTR STRL (DRAIN) ×4 IMPLANT
BAG DRN ANRFLXCHMBR STRAP LEK (BAG) ×2
BAG DRN UROCATH (DRAIN) ×2
BAG URINE DRAINAGE (UROLOGICAL SUPPLIES) IMPLANT
BAG URINE LEG 19OZ MD ST LTX (BAG) ×3 IMPLANT
CATH COUDE FOLEY 2W 5CC 18FR (CATHETERS) ×3 IMPLANT
CATH FOLEY 2WAY SLVR  5CC 20FR (CATHETERS)
CATH FOLEY 2WAY SLVR  5CC 22FR (CATHETERS)
CATH FOLEY 2WAY SLVR 5CC 20FR (CATHETERS) IMPLANT
CATH FOLEY 2WAY SLVR 5CC 22FR (CATHETERS) IMPLANT
CATH URET 5FR 28IN CONE TIP (BALLOONS) ×2
CATH URET 5FR 70CM CONE TIP (BALLOONS) ×1 IMPLANT
CLOTH BEACON ORANGE TIMEOUT ST (SAFETY) ×4 IMPLANT
DRSG TELFA 3X8 NADH (GAUZE/BANDAGES/DRESSINGS) IMPLANT
ELECT BUTTON BIOP 24F 90D PLAS (MISCELLANEOUS) IMPLANT
ELECT REM PT RETURN 9FT ADLT (ELECTROSURGICAL) ×4
ELECTRODE REM PT RTRN 9FT ADLT (ELECTROSURGICAL) ×2 IMPLANT
EVACUATOR MICROVAS BLADDER (UROLOGICAL SUPPLIES) IMPLANT
GLOVE BIO SURGEON STRL SZ7.5 (GLOVE) ×4 IMPLANT
GOWN STRL REUS W/ TWL XL LVL3 (GOWN DISPOSABLE) ×3 IMPLANT
GOWN STRL REUS W/TWL LRG LVL3 (GOWN DISPOSABLE) ×3 IMPLANT
GOWN STRL REUS W/TWL XL LVL3 (GOWN DISPOSABLE) ×8
HOLDER FOLEY CATH W/STRAP (MISCELLANEOUS) IMPLANT
KIT ROOM TURNOVER WOR (KITS) ×4 IMPLANT
MANIFOLD NEPTUNE II (INSTRUMENTS) ×3 IMPLANT
PACK CYSTO (CUSTOM PROCEDURE TRAY) ×4 IMPLANT
PAD DRESSING TELFA 3X8 NADH (GAUZE/BANDAGES/DRESSINGS) IMPLANT
PLUG CATH AND CAP STER (CATHETERS) IMPLANT
SET ASPIRATION TUBING (TUBING) IMPLANT
SYRINGE IRR TOOMEY STRL 70CC (SYRINGE) ×3 IMPLANT
TUBE CONNECTING 12'X1/4 (SUCTIONS)
TUBE CONNECTING 12X1/4 (SUCTIONS) IMPLANT
WATER STERILE IRR 3000ML UROMA (IV SOLUTION) ×6 IMPLANT

## 2016-07-21 NOTE — Anesthesia Postprocedure Evaluation (Signed)
Anesthesia Post Note  Patient: Trevor Phillips  Procedure(s) Performed: Procedure(s): CYSTOSCOPY WITH RETROGRADE PYELOGRAM, CLOT EVACUATION, BLADDER BIOPSY WITH FULGERATION  Patient location during evaluation: PACU Anesthesia Type: General Level of consciousness: awake and alert Pain management: pain level controlled Vital Signs Assessment: post-procedure vital signs reviewed and stable Respiratory status: spontaneous breathing, nonlabored ventilation, respiratory function stable and patient connected to nasal cannula oxygen Cardiovascular status: blood pressure returned to baseline and stable Postop Assessment: no signs of nausea or vomiting Anesthetic complications: no    Last Vitals:  Vitals:   07/21/16 0915 07/21/16 0940  BP: (!) 125/58   Pulse: 80 80  Resp: 15 17  Temp:      Last Pain:  Vitals:   07/21/16 0623  TempSrc: Oral                 [name]

## 2016-07-21 NOTE — H&P (Signed)
H&P  Chief Complaint: gross hematuria  History of Present Illness: 82 yo s/p RRP, h/o BNC, atonic bladder on CIC with recurrent gross hematuria. I could not access bladder on prior office cysto. Brought for cysto today. On ampicillin. No fever or gross hematuria. 16Fr catheter put in office.   Past Medical History:  Diagnosis Date  . Anemia due to acute blood loss    gross hematuria  . Bladder neck contracture    chronic  . Diverticulosis of colon   . Foley catheter in place    placed 07-11-2016  . Gross hematuria   . History of cerebral infarction    per CT 05-18-2012  chronic lacunar infarcts in basal ganglia  (silent?,  pt denies history S&S)  . History of colon polyps    2004  . History of diverticulitis    2014  . History of prostate cancer 2003   s/p  radical prostatectomy 03-21-2002--  no recurrence (last PSA 07/2016  0.01)  . History of recurrent UTIs   . History of urinary retention   . Neurogenic bladder    since prostatectomy 2003  . Self-catheterizes urinary bladder    SECONDARY TO NEUROGENIC BLADDER   . Urinary retention   . Wears glasses    Past Surgical History:  Procedure Laterality Date  . CATARACT EXTRACTION W/ INTRAOCULAR LENS  IMPLANT, BILATERAL  2010  . CYSTO/  DILATATION BLADDER NECK CONTRACTURE  06/02/2002  . EYE SURGERY Right yrs ago  . PROSTATE SURGERY  yrs ago  . RADICAL RETROPUBIC PROSTATECTOMY /  REPAIR RECTAL PERFORATION  03-21-2002  dr Tresa Endo  . TRANSURETHRAL RESECTION OF PROSTATE  09/06/2001    Home Medications:  Prescriptions Prior to Admission  Medication Sig Dispense Refill Last Dose  . ampicillin (PRINCIPEN) 500 MG capsule Take 1 capsule (500 mg total) by mouth 3 (three) times daily. 42 capsule 0 07/20/2016 at Unknown time  . b complex vitamins tablet Take 1 tablet by mouth every morning.    07/20/2016 at Unknown time  . cetirizine (ZYRTEC) 10 MG tablet Take 10 mg by mouth every morning.    07/20/2016 at Unknown time  .  Cholecalciferol (VITAMIN D3) 2000 UNITS TABS Take 2,000 Units by mouth daily.   07/20/2016 at Unknown time  . Coenzyme Q10 (CO Q-10) 200 MG CAPS Take 200 mg by mouth daily.   07/20/2016 at Unknown time  . ezetimibe (ZETIA) 10 MG tablet Take 10 mg by mouth every evening.    07/20/2016 at Unknown time  . Glucos-Chond-Hyal Ac-Ca Fructo (MOVE FREE JOINT HEALTH ADVANCE PO) Take 1 capsule by mouth daily.   07/20/2016 at Unknown time  . L-Lysine 500 MG TABS Take 500 mg by mouth daily.   07/20/2016 at Unknown time  . Lutein-Zeaxanthin 25-5 MG CAPS Take 1 capsule by mouth daily.   07/20/2016 at Unknown time  . methenamine (HIPREX) 1 G tablet Take 0.5 g by mouth 2 (two) times daily with a meal.   07/20/2016 at Unknown time  . methenamine (MANDELAMINE) 0.5 GM tablet Take 500 mg by mouth 2 (two) times daily.   07/20/2016 at Unknown time  . Multiple Vitamins-Minerals (MULTIVITAMIN WITH MINERALS) tablet Take 1 tablet by mouth every morning.    07/20/2016 at Unknown time  . Omega-3 Fatty Acids (FISH OIL PO) Take 1 capsule by mouth daily.    07/20/2016 at Unknown time  . Probiotic Product (PROBIOTIC DAILY PO) Take 1 capsule by mouth daily.    07/20/2016 at Unknown  time  . Resveratrol 100 MG CAPS Take 100 mg by mouth daily.   07/20/2016 at Unknown time  . white petrolatum (VASELINE) GEL Apply 1 application topically See admin instructions. Applies to both eye lids before bed for prevention   07/20/2016 at Unknown time  . Zinc 50 MG CAPS Take 50 mg by mouth every evening.   07/20/2016 at Unknown time   Allergies: No Known Allergies  History reviewed. No pertinent family history. Social History:  reports that he quit smoking about 59 years ago. His smoking use included Cigarettes. He has a 8.00 pack-year smoking history. He quit smokeless tobacco use about 37 years ago. His smokeless tobacco use included Snuff. He reports that he drinks alcohol. He reports that he does not use drugs.  ROS: A complete review of systems was  performed.  All systems are negative except for pertinent findings as noted. ROS   Physical Exam:  Vital signs in last 24 hours: Temp:  [98.6 F (37 C)] 98.6 F (37 C) (09/12 0623) Resp:  [17] 17 (09/12 0623) BP: (126)/(59) 126/59 (09/12 0623) SpO2:  [100 %] 100 % (09/12 0623) Weight:  [60 kg (132 lb 5 oz)] 60 kg (132 lb 5 oz) (09/12 [id number]) Psych/General:  Alert and oriented, No acute distress HEENT: Normocephalic, atraumatic Neck: No JVD or lymphadenopathy Lungs: Regular rate and effort Abdomen: Soft, nontender, nondistended, no abdominal masses Back: No CVA tenderness Extremities: No edema Neurologic: Grossly intact  Laboratory Data:  No results found for this or any previous visit (from the past 24 hour(s)). Recent Results (from the past 240 hour(s))  Urine culture     Status: Abnormal   Collection Time: 07/11/16  2:14 PM  Result Value Ref Range Status   Specimen Description URINE, CATHETERIZED  Final   Special Requests Normal  Final   Culture (A)  Final    50,000 COLONIES/mL ENTEROCOCCUS FAECALIS >=100,000 COLONIES/mL AEROCOCCUS URINAE Standardized susceptibility testing for this organism is not available.    Report Status 07/16/2016 FINAL  Final   Organism ID, Bacteria ENTEROCOCCUS FAECALIS (A)  Final      Susceptibility   Enterococcus faecalis - MIC*    AMPICILLIN <=2 SENSITIVE Sensitive     LEVOFLOXACIN 0.5 SENSITIVE Sensitive     NITROFURANTOIN <=16 SENSITIVE Sensitive     VANCOMYCIN 1 SENSITIVE Sensitive     * 50,000 COLONIES/mL ENTEROCOCCUS FAECALIS   Creatinine: No results for input(s): CREATININE in the last 168 hours.  Impression/Assessment:  Gross hematuria, BNC, atonic bladder on CIC  -   Plan:  I discussed with the patient the nature, potential benefits, risks and alternatives to cystoscopy possible dilation or incision of bladder neck, including side effects of the proposed treatment, the likelihood of the patient achieving the goals of the  procedure, and any potential problems that might occur during the procedure or recuperation. All questions answered. Patient elects to proceed.    Dillard Pascal 07/21/2016, 7:32 AM

## 2016-07-21 NOTE — Op Note (Signed)
Preoperative diagnosis: Gross hematuria, bladder neck contracture, atonic bladder Postoperative diagnosis: Gross hematuria, bladder neck contracture, atonic bladder, bladder erythema  Procedure: Exam under anesthesia, cystoscopy with clot evacuation, bilateral retrograde pyelogram, bladder biopsy with fulguration 0.5-2 cm  Surgeon: Junious Silk  Anesthesia: Gen.  Indication for procedure: 82 year old with recurrent gross hematuria. Brought today for formal cystoscopy.  Findings: On exam under anesthesia the penis was circumcised and without mass or lesion. The testicles were descended bilaterally and palpably normal.  On cystoscopy the urethra was unremarkable, there was a bladder neck contracture that was approximately 20 [place] in size. I was able to negotiate the 23 [place] rigid scope into the bladder. The trigone was closer to the bladder neck after the prostatectomy. The right ureteral orifice was visualized and noted to have clear efflux of urine. The left ureteral orifice was more lateral on the side wall and was noted after fluoroscein was given. Efflux was clear. The bladder contains some erythematous patches with the right lateral left lateral patches having some white tissue mixed in. There were no papillary tumors. There were no stones in the bladder. There was a large organized clot in the bladder which was evacuated.  Right retrograde pyelogram-this outlined a single ureter single collecting system unit without filling defect, stricture or dilation. There was brisk efflux.  Left retrograde pyelogram-this outlined a single ureter single collecting system unit without filling defect, stricture or dilation. There was brisk efflux.  Description of procedure: After consent was obtained patient was brought to the operating room. After adequate anesthesia he was placed in lithotomy position and prepped and draped in the usual sterile fashion. A timeout was performed to confirm the patient and  procedure. An exam under anesthesia was performed. The cystoscope was passed per urethra and guided into the bladder. The bladder was inspected with 30 and 70 lens. The clot in the bladder was evacuated with a [place] syringe. The right ureteral orifice was cannulated with a cone-tipped catheter and retrograde injection of contrast was performed. The patient was given fluoroscein in the left ureteral orifice quickly located more lateral. Efflux was good. Left ureteral orifice cannulated with a cone-tipped and retrograde injection of contrast was performed. The rigid biopsy forceps were then used to biopsy the right lateral and this was fulgurated. The right posterior erythematous patch was biopsied and fulgurated. The left lateral was biopsied and fulgurated 2 with light fulguration of about 2 cm of abnormal mucosa. These biopsies and compost the abnormal areas the bladder. Hemostasis was excellent at low-pressure. At the scope was removed and an 46 [place] coud catheter was passed without difficulty. I did not feel the bladder neck needed formal dilation or incision. He was awakened and taken to the PACU in stable condition.   Complications - none  EBL - minimal  Specimens: 1) right lateral bladder  2) right posterior bladder 3) left lateral bladder x 2

## 2016-07-21 NOTE — Transfer of Care (Signed)
Immediate Anesthesia Transfer of Care Note  Patient: Trevor Phillips  Procedure(s) Performed: Procedure(s) with comments: CYSTOSCOPY WITH RETROGRADE PYELOGRAM, CLOT EVACUATION, BLADDER BIOPSY WITH FULGERATION - 0.5 TO 2 CM   Patient Location: PACU  Anesthesia Type:General  Level of Consciousness: awake, alert , oriented and patient cooperative  Airway & Oxygen Therapy: Patient Spontanous Breathing and Patient connected to nasal cannula oxygen  Post-op Assessment: Report given to RN and Post -op Vital signs reviewed and stable  Post vital signs: Reviewed and stable  Last Vitals:  Vitals:   07/21/16 0623  BP: (!) 126/59  Resp: 17  Temp: 37 C    Last Pain:  Vitals:   07/21/16 0623  TempSrc: Oral      Patients Stated Pain Goal: 5 ([id number])  Complications: No apparent anesthesia complications

## 2016-07-21 NOTE — Anesthesia Procedure Notes (Signed)
Procedure Name: LMA Insertion Date/Time: 07/21/2016 7:39 AM Performed by: Wanita Chamberlain Pre-anesthesia Checklist: Patient identified, Timeout performed, Emergency Drugs available, Suction available and Patient being monitored Patient Re-evaluated:Patient Re-evaluated prior to inductionOxygen Delivery Method: Circle system utilized Preoxygenation: Pre-oxygenation with 100% oxygen Intubation Type: IV induction Ventilation: Mask ventilation without difficulty LMA: LMA inserted LMA Size: 4.0 Number of attempts: 1 Placement Confirmation: positive ETCO2 and breath sounds checked- equal and bilateral Tube secured with: Tape Dental Injury: Teeth and Oropharynx as per pre-operative assessment

## 2016-07-21 NOTE — Discharge Instructions (Signed)

## 2016-07-22 ENCOUNTER — Encounter (HOSPITAL_BASED_OUTPATIENT_CLINIC_OR_DEPARTMENT_OTHER): Payer: Self-pay | Admitting: Urology

## 2016-09-25 ENCOUNTER — Other Ambulatory Visit: Payer: Self-pay | Admitting: Geriatric Medicine

## 2016-09-25 DIAGNOSIS — R600 Localized edema: Secondary | ICD-10-CM

## 2016-09-28 ENCOUNTER — Other Ambulatory Visit: Payer: Self-pay | Admitting: Oncology

## 2016-09-29 ENCOUNTER — Other Ambulatory Visit: Payer: Medicare Other

## 2016-10-05 ENCOUNTER — Other Ambulatory Visit: Payer: Medicare Other

## 2016-10-06 ENCOUNTER — Ambulatory Visit
Admission: RE | Admit: 2016-10-06 | Discharge: 2016-10-06 | Disposition: A | Discharge status: Home / Self Care | Payer: Medicare Other | Source: Ambulatory Visit | Attending: Geriatric Medicine | Admitting: Geriatric Medicine

## 2016-10-06 DIAGNOSIS — R600 Localized edema: Secondary | ICD-10-CM

## 2016-12-04 ENCOUNTER — Other Ambulatory Visit: Payer: Self-pay | Admitting: Otolaryngology

## 2016-12-04 DIAGNOSIS — H903 Sensorineural hearing loss, bilateral: Secondary | ICD-10-CM

## 2016-12-09 ENCOUNTER — Ambulatory Visit
Admission: RE | Admit: 2016-12-09 | Discharge: 2016-12-09 | Disposition: A | Discharge status: Home / Self Care | Payer: Medicare Other | Source: Ambulatory Visit | Attending: Otolaryngology | Admitting: Otolaryngology

## 2016-12-09 DIAGNOSIS — H903 Sensorineural hearing loss, bilateral: Secondary | ICD-10-CM

## 2016-12-09 MED ORDER — GADOBENATE DIMEGLUMINE 529 MG/ML IV SOLN
12.0000 mL | Freq: Once | INTRAVENOUS | Status: AC | PRN
  Administered 2016-12-09: 12 mL via INTRAVENOUS

## 2019-10-23 ENCOUNTER — Emergency Department (HOSPITAL_BASED_OUTPATIENT_CLINIC_OR_DEPARTMENT_OTHER)
Admission: EM | Admit: 2019-10-23 | Discharge: 2019-10-24 | Disposition: A | Discharge status: Home / Self Care | Payer: Medicare Other | Attending: Emergency Medicine | Admitting: Emergency Medicine

## 2019-10-23 ENCOUNTER — Other Ambulatory Visit: Payer: Self-pay

## 2019-10-23 ENCOUNTER — Encounter (HOSPITAL_BASED_OUTPATIENT_CLINIC_OR_DEPARTMENT_OTHER): Payer: Self-pay | Admitting: Emergency Medicine

## 2019-10-23 ENCOUNTER — Emergency Department (HOSPITAL_BASED_OUTPATIENT_CLINIC_OR_DEPARTMENT_OTHER): Payer: Medicare Other

## 2019-10-23 DIAGNOSIS — R0789 Other chest pain: Secondary | ICD-10-CM | POA: Diagnosis present

## 2019-10-23 DIAGNOSIS — I10 Essential (primary) hypertension: Secondary | ICD-10-CM | POA: Diagnosis not present

## 2019-10-23 DIAGNOSIS — Z87891 Personal history of nicotine dependence: Secondary | ICD-10-CM | POA: Diagnosis not present

## 2019-10-23 DIAGNOSIS — R079 Chest pain, unspecified: Secondary | ICD-10-CM

## 2019-10-23 MED ORDER — ASPIRIN 325 MG PO TABS
325.0000 mg | ORAL_TABLET | Freq: Once | ORAL | Status: AC
  Administered 2019-10-23: 325 mg via ORAL
  Filled 2019-10-23: qty 1

## 2019-10-23 MED ORDER — NITROGLYCERIN 0.4 MG SL SUBL: 0.4000 mg | SUBLINGUAL_TABLET | SUBLINGUAL | Status: DC | PRN

## 2019-10-23 NOTE — ED Triage Notes (Signed)
Pt brought in by EMS with c/o left quadrant pain that started after he chopped some firewood today  Denies N/V/D  Has been eating and drinking as normal  Pt unsteady gait  Pt lives at home with wife and son

## 2019-10-23 NOTE — ED Notes (Signed)
Returned from xray

## 2019-10-24 LAB — CBC WITH DIFFERENTIAL/PLATELET
Abs Immature Granulocytes: 0.06 10*3/uL (ref 0.00–0.07)
Basophils Absolute: 0.1 10*3/uL (ref 0.0–0.1)
Basophils Relative: 1 %
Eosinophils Absolute: 0.1 10*3/uL (ref 0.0–0.5)
Eosinophils Relative: 1 %
HCT: 44.5 % (ref 39.0–52.0)
Hemoglobin: 14.7 g/dL (ref 13.0–17.0)
Immature Granulocytes: 1 %
Lymphocytes Relative: 19 %
Lymphs Abs: 1.8 10*3/uL (ref 0.7–4.0)
MCH: 32.2 pg (ref 26.0–34.0)
MCHC: 33 g/dL (ref 30.0–36.0)
MCV: 97.4 fL (ref 80.0–100.0)
Monocytes Absolute: 0.5 10*3/uL (ref 0.1–1.0)
Monocytes Relative: 5 %
Neutro Abs: 7.4 10*3/uL (ref 1.7–7.7)
Neutrophils Relative %: 73 %
Platelets: 242 10*3/uL (ref 150–400)
RBC: 4.57 MIL/uL (ref 4.22–5.81)
RDW: 12.8 % (ref 11.5–15.5)
WBC: 9.9 10*3/uL (ref 4.0–10.5)
nRBC: 0 % (ref 0.0–0.2)

## 2019-10-24 LAB — COMPREHENSIVE METABOLIC PANEL
ALT: 18 U/L (ref 0–44)
AST: 21 U/L (ref 15–41)
Albumin: 4.3 g/dL (ref 3.5–5.0)
Alkaline Phosphatase: 113 U/L (ref 38–126)
Anion gap: 11 (ref 5–15)
BUN: 41 mg/dL — ABNORMAL HIGH (ref 8–23)
CO2: 25 mmol/L (ref 22–32)
Calcium: 9.7 mg/dL (ref 8.9–10.3)
Chloride: 104 mmol/L (ref 98–111)
Creatinine, Ser: 1.1 mg/dL (ref 0.61–1.24)
GFR calc Af Amer: >60 mL/min (ref >60)
GFR calc non Af Amer: >60 mL/min (ref >60)
Glucose, Bld: 111 mg/dL — ABNORMAL HIGH (ref 70–99)
Potassium: 4.3 mmol/L (ref 3.5–5.1)
Sodium: 140 mmol/L (ref 135–145)
Total Bilirubin: 1.6 mg/dL — ABNORMAL HIGH (ref 0.3–1.2)
Total Protein: 6.9 g/dL (ref 6.5–8.1)

## 2019-10-24 LAB — TROPONIN I (HIGH SENSITIVITY)
Troponin I (High Sensitivity): 7 ng/L (ref <18)
Troponin I (High Sensitivity): 8 ng/L (ref <18)

## 2019-10-24 MED ORDER — LACTATED RINGERS IV BOLUS
500.0000 mL | Freq: Once | INTRAVENOUS | Status: AC
  Administered 2019-10-24: 500 mL via INTRAVENOUS

## 2019-10-24 NOTE — ED Notes (Signed)
Pt out to desk asking for a ride home  Attempted to call his son, Wille Glaser.  No answer, message left

## 2019-10-24 NOTE — ED Notes (Signed)
Attempted to call pts wife  No answer and her voicemail has not been set up

## 2019-10-24 NOTE — ED Provider Notes (Signed)
Emergency Department Provider Note   I have reviewed the triage vital signs and the nursing notes.   HISTORY  Chief Complaint Chest Pain   HPI Trevor Phillips is a 85 y.o. male who presents to the emergency department today secondary to chest pain.  Patient states that he was doing his usual thing working around the yard today he had a couple episodes of feeling "off".  He did not know what this meant and cannot further expand upon it besides just not feeling right.  Patient states that tonight prior to coming in here he started having some very minimal left-sided lateral chest discomfort just at the low edge of his ribs.  Not worse with movement.  Not worse with breathing.  Not worse with palpation.  Is not associated nausea, vomiting, lightheadedness, shortness of breath.  Does not seem exertional.  Has no other associated symptoms.  No history of heart disease that he knows of.  Remote history of prostate cancer.   No other associated or modifying symptoms.    Past Medical History:  Diagnosis Date  . Anemia due to acute blood loss    gross hematuria  . Bladder neck contracture    chronic  . Diverticulosis of colon   . Foley catheter in place    placed 07-11-2016  . Gross hematuria   . History of cerebral infarction    per CT 05-18-2012  chronic lacunar infarcts in basal ganglia  (silent?,  pt denies history S&S)  . History of colon polyps    2004  . History of diverticulitis    2014  . History of prostate cancer 2003   s/p  radical prostatectomy 03-21-2002--  no recurrence (last PSA 07/2016  0.01)  . History of recurrent UTIs   . History of urinary retention   . Neurogenic bladder    since prostatectomy 2003  . Self-catheterizes urinary bladder    SECONDARY TO NEUROGENIC BLADDER   . Urinary retention   . Wears glasses     Patient Active Problem List   Diagnosis Date Noted  . Acute blood loss anemia 07/11/2016  . Urinary retention with incomplete bladder emptying    . Diverticular disease   . Hematuria   . Cancer of prostate [facility])     Past Surgical History:  Procedure Laterality Date  . CATARACT EXTRACTION W/ INTRAOCULAR LENS  IMPLANT, BILATERAL  2010  . CYSTO/  DILATATION BLADDER NECK CONTRACTURE  06/02/2002  . CYSTOSCOPY W/ RETROGRADES  07/21/2016   Procedure: CYSTOSCOPY WITH RETROGRADE PYELOGRAM, CLOT EVACUATION, BLADDER BIOPSY WITH FULGERATION;  Surgeon: Festus Aloe, MD;  Location: [facility];  Service: Urology;;  0.5 TO 2 CM   . EYE SURGERY Right yrs ago  . PROSTATE SURGERY  yrs ago  . RADICAL RETROPUBIC PROSTATECTOMY /  REPAIR RECTAL PERFORATION  03-21-2002  dr Tresa Endo  . TRANSURETHRAL RESECTION OF PROSTATE  09/06/2001    Current Outpatient Rx  . Order #: [id number] Class: Print  . Order #: [id number] Class: Historical Med  . Order #: [id number] Class: Historical Med  . Order #: [id number] Class: Historical Med  . Order #: [id number] Class: Historical Med  . Order #: [id number] Class: Historical Med  . Order #: [id number] Class: Historical Med  . Order #: [id number] Class: Historical Med  . Order #: [id number] Class: Historical Med  . Order #: [id number] Class: Historical Med  . Order #: [id number] Class: Historical Med  . Order #: [id number] Class: Historical Med  . Order #: [id number] Class: Historical Med  . Order #:  [id number] Class: Historical Med  . Order #: [id number] Class: Historical Med  . Order #: [id number] Class: Historical Med  . Order #: [id number] Class: Historical Med    Allergies Patient has no known allergies.  History reviewed. No pertinent family history.  Social History Social History   Tobacco Use  . Smoking status: Former Smoker    Packs/day: 1.00    Years: 8.00    Pack years: 8.00    Types: Cigarettes    Quit date: 07/16/1957    Years since quitting: 62.3  . Smokeless tobacco: Former Systems developer    Types: Snuff    Quit date: 07/17/1979  Substance Use Topics  . Alcohol use: Yes    Comment: "very little"  . Drug use: No      Review of Systems  All other systems negative except as documented in the HPI. All pertinent positives and negatives as reviewed in the HPI. ____________________________________________   PHYSICAL EXAM:  VITAL SIGNS: ED Triage Vitals  Enc Vitals Group     BP 10/23/19 2213 (!) 172/90     Pulse Rate 10/23/19 2213 64     Resp 10/23/19 2213 20     Temp 10/23/19 2213 97.9 F (36.6 C)     Temp Source 10/23/19 2213 Oral     SpO2 10/23/19 2213 100 %    Constitutional: Alert and oriented. Well appearing and in no acute distress. Eyes: Conjunctivae are normal. PERRL. EOMI. Head: Atraumatic. Nose: No congestion/rhinnorhea. Mouth/Throat: Mucous membranes are moist.  Oropharynx non-erythematous. Neck: No stridor.  No meningeal signs.   Cardiovascular: Normal rate, regular rhythm. Good peripheral circulation. Grossly normal heart sounds.   Respiratory: Normal respiratory effort.  No retractions. Lungs CTAB. Gastrointestinal: Soft and nontender. No distention.  Musculoskeletal: No lower extremity tenderness nor edema. No gross deformities of extremities. Neurologic:  Normal speech and language. No gross focal neurologic deficits are appreciated.  Skin:  Skin is warm, dry and intact. No rash noted.   ____________________________________________   LABS (all labs ordered are listed, but only abnormal results are displayed)  Labs Reviewed  COMPREHENSIVE METABOLIC PANEL - Abnormal; Notable for the following components:      Result Value   Glucose, Bld 111 (*)    BUN 41 (*)    Total Bilirubin 1.6 (*)    All other components within normal limits  CBC WITH DIFFERENTIAL/PLATELET  TROPONIN I (HIGH SENSITIVITY)  TROPONIN I (HIGH SENSITIVITY)   ____________________________________________  EKG   EKG Interpretation  Date/Time:  Monday October 23 2019 22:15:02 EST Ventricular Rate:  66 PR Interval:    QRS Duration: 87 QT Interval:  436 QTC Calculation: 440 R Axis:   15 Text  Interpretation: Sinus rhythm Atrial premature complexes Nonspecific T abnormalities, lateral leads Technically poor tracing needs repeat, no obvious changes from 2017 Confirmed by Merrily Pew [phone]) on 10/23/2019 11:37:02 PM       EKG Interpretation  Date/Time:  Monday October 23 2019 23:44:30 EST Ventricular Rate:  63 PR Interval:    QRS Duration: 87 QT Interval:  454 QTC Calculation: 465 R Axis:   34 Text Interpretation: Sinus rhythm Minimal ST depression, inferior leads poor tracing again, but between this and last one there is no evidence of acute changes compared to 2017 Confirmed by Merrily Pew [phone]) on 10/24/2019 2:02:19 AM        ____________________________________________  RADIOLOGY  DG Chest 2 View  Result Date: 10/24/2019 CLINICAL DATA:  Initial evaluation for acute chest pain. EXAM: CHEST - 2 VIEW COMPARISON:  Prior radiograph from 05/18/2012. FINDINGS: Transverse heart size within normal limits. Mediastinal silhouette normal. Aortic atherosclerosis. Lungs are mildly hyperinflated. No focal infiltrates. No pulmonary edema or pleural effusion. No pneumothorax. No acute osseous abnormality. Osteopenia noted. Exaggeration of the normal thoracic kyphosis. IMPRESSION: 1. No active cardiopulmonary disease. 2.  Aortic Atherosclerosis (ICD10-I70.0). Electronically Signed   By: Jeannine Boga M.D.   On: 10/24/2019 00:03    ____________________________________________   PROCEDURES  Procedure(s) performed:   Procedures   ____________________________________________   INITIAL IMPRESSION / ASSESSMENT AND PLAN / ED COURSE  Patient with low heart score doubt actual ACS but will get delta troponins.  Presenting symptoms consistent with pulmonary embolus low likelihood of that.  Considered aortic dissection as he does have elevated blood pressure here however his pain seems to be in the 1 spot left lateral with a normal chest x-ray he gets less  likely.  Could be musculoskeletal from working outside however does not seem like he had any significant exertion or injuries.  Could also consider splenic infarct however without any history of A. fib or other vascular diseases seems less likely.  O2 low on vitals but only when sleeping. Also persistently high bp. No e/o htn emergency. Delta troponins negative. Will fu w/ pcp for further workup/management of BP and here if cp returns.      Pertinent labs & imaging results that were available during my care of the patient were reviewed by me and considered in my medical decision making (see chart for details).  A medical screening exam was performed and I feel the patient has had an appropriate workup for their chief complaint at this time and likelihood of emergent condition existing is low. They have been counseled on decision, discharge, follow up and which symptoms necessitate immediate return to the emergency department. They or their family verbally stated understanding and agreement with plan and discharged in stable condition.   ____________________________________________  FINAL CLINICAL IMPRESSION(S) / ED DIAGNOSES  Final diagnoses:  Nonspecific chest pain  Hypertension, unspecified type     MEDICATIONS GIVEN DURING THIS VISIT:  Medications  nitroGLYCERIN (NITROSTAT) SL tablet 0.4 mg (has no administration in time range)  aspirin tablet 325 mg (325 mg Oral Given 10/23/19 2356)  lactated ringers bolus 500 mL (0 mLs Intravenous Stopped 10/24/19 0129)     NEW OUTPATIENT MEDICATIONS STARTED DURING THIS VISIT:  New Prescriptions   No medications on file    Note:  This note was prepared with assistance of Dragon voice recognition software. Occasional wrong-word or sound-a-like substitutions may have occurred due to the inherent limitations of voice recognition software.   Illias Pantano, Corene Cornea, MD 10/24/19 [phone]

## 2019-10-24 NOTE — ED Notes (Signed)
ED Provider at bedside. 

## 2019-10-24 NOTE — ED Notes (Signed)
Attempted to call pt's son to come and pick him up  No answer

## 2019-10-24 NOTE — ED Notes (Signed)
Spoke with pt's son Rico Junker he will be here in 30-45 minutes to pick him up

## 2019-10-24 NOTE — ED Notes (Signed)
Attempted to call Graceson Hebda, pts son, no answer

## 2020-03-22 DIAGNOSIS — I10 Essential (primary) hypertension: Secondary | ICD-10-CM | POA: Diagnosis not present

## 2020-03-22 DIAGNOSIS — E7800 Pure hypercholesterolemia, unspecified: Secondary | ICD-10-CM | POA: Diagnosis not present

## 2020-03-22 DIAGNOSIS — H35033 Hypertensive retinopathy, bilateral: Secondary | ICD-10-CM | POA: Diagnosis not present

## 2020-03-25 DIAGNOSIS — N312 Flaccid neuropathic bladder, not elsewhere classified: Secondary | ICD-10-CM | POA: Diagnosis not present

## 2020-03-25 DIAGNOSIS — N139 Obstructive and reflux uropathy, unspecified: Secondary | ICD-10-CM | POA: Diagnosis not present

## 2020-04-17 DIAGNOSIS — I10 Essential (primary) hypertension: Secondary | ICD-10-CM | POA: Diagnosis not present

## 2020-04-17 DIAGNOSIS — E7800 Pure hypercholesterolemia, unspecified: Secondary | ICD-10-CM | POA: Diagnosis not present

## 2020-04-17 DIAGNOSIS — Z23 Encounter for immunization: Secondary | ICD-10-CM | POA: Diagnosis not present

## 2020-04-18 DIAGNOSIS — I10 Essential (primary) hypertension: Secondary | ICD-10-CM | POA: Diagnosis not present

## 2020-04-18 DIAGNOSIS — E7800 Pure hypercholesterolemia, unspecified: Secondary | ICD-10-CM | POA: Diagnosis not present

## 2020-04-18 DIAGNOSIS — H35033 Hypertensive retinopathy, bilateral: Secondary | ICD-10-CM | POA: Diagnosis not present

## 2020-05-15 DIAGNOSIS — L570 Actinic keratosis: Secondary | ICD-10-CM | POA: Diagnosis not present

## 2020-05-15 DIAGNOSIS — D485 Neoplasm of uncertain behavior of skin: Secondary | ICD-10-CM | POA: Diagnosis not present

## 2020-05-15 DIAGNOSIS — L821 Other seborrheic keratosis: Secondary | ICD-10-CM | POA: Diagnosis not present

## 2020-05-15 DIAGNOSIS — D225 Melanocytic nevi of trunk: Secondary | ICD-10-CM | POA: Diagnosis not present

## 2020-05-15 DIAGNOSIS — Z85828 Personal history of other malignant neoplasm of skin: Secondary | ICD-10-CM | POA: Diagnosis not present

## 2020-05-15 DIAGNOSIS — D692 Other nonthrombocytopenic purpura: Secondary | ICD-10-CM | POA: Diagnosis not present

## 2020-05-15 DIAGNOSIS — Z85820 Personal history of malignant melanoma of skin: Secondary | ICD-10-CM | POA: Diagnosis not present

## 2020-05-15 DIAGNOSIS — C4442 Squamous cell carcinoma of skin of scalp and neck: Secondary | ICD-10-CM | POA: Diagnosis not present

## 2020-05-20 DIAGNOSIS — E7800 Pure hypercholesterolemia, unspecified: Secondary | ICD-10-CM | POA: Diagnosis not present

## 2020-05-20 DIAGNOSIS — I10 Essential (primary) hypertension: Secondary | ICD-10-CM | POA: Diagnosis not present

## 2020-05-20 DIAGNOSIS — H35033 Hypertensive retinopathy, bilateral: Secondary | ICD-10-CM | POA: Diagnosis not present

## 2020-07-09 DIAGNOSIS — H35033 Hypertensive retinopathy, bilateral: Secondary | ICD-10-CM | POA: Diagnosis not present

## 2020-07-09 DIAGNOSIS — E7800 Pure hypercholesterolemia, unspecified: Secondary | ICD-10-CM | POA: Diagnosis not present

## 2020-07-09 DIAGNOSIS — I10 Essential (primary) hypertension: Secondary | ICD-10-CM | POA: Diagnosis not present

## 2020-07-26 DIAGNOSIS — N390 Urinary tract infection, site not specified: Secondary | ICD-10-CM | POA: Diagnosis not present

## 2020-07-26 DIAGNOSIS — D649 Anemia, unspecified: Secondary | ICD-10-CM | POA: Diagnosis not present

## 2020-07-26 DIAGNOSIS — R030 Elevated blood-pressure reading, without diagnosis of hypertension: Secondary | ICD-10-CM | POA: Diagnosis not present

## 2020-07-26 DIAGNOSIS — Z7722 Contact with and (suspected) exposure to environmental tobacco smoke (acute) (chronic): Secondary | ICD-10-CM | POA: Diagnosis not present

## 2020-07-26 DIAGNOSIS — R269 Unspecified abnormalities of gait and mobility: Secondary | ICD-10-CM | POA: Diagnosis not present

## 2020-07-26 DIAGNOSIS — E785 Hyperlipidemia, unspecified: Secondary | ICD-10-CM | POA: Diagnosis not present

## 2020-07-26 DIAGNOSIS — I951 Orthostatic hypotension: Secondary | ICD-10-CM | POA: Diagnosis not present

## 2020-07-26 DIAGNOSIS — T7840XD Allergy, unspecified, subsequent encounter: Secondary | ICD-10-CM | POA: Diagnosis not present

## 2020-07-26 DIAGNOSIS — R32 Unspecified urinary incontinence: Secondary | ICD-10-CM | POA: Diagnosis not present

## 2020-08-05 DIAGNOSIS — H35033 Hypertensive retinopathy, bilateral: Secondary | ICD-10-CM | POA: Diagnosis not present

## 2020-08-05 DIAGNOSIS — E7800 Pure hypercholesterolemia, unspecified: Secondary | ICD-10-CM | POA: Diagnosis not present

## 2020-08-05 DIAGNOSIS — I10 Essential (primary) hypertension: Secondary | ICD-10-CM | POA: Diagnosis not present

## 2020-09-02 DIAGNOSIS — E7800 Pure hypercholesterolemia, unspecified: Secondary | ICD-10-CM | POA: Diagnosis not present

## 2020-09-02 DIAGNOSIS — H35033 Hypertensive retinopathy, bilateral: Secondary | ICD-10-CM | POA: Diagnosis not present

## 2020-09-02 DIAGNOSIS — I10 Essential (primary) hypertension: Secondary | ICD-10-CM | POA: Diagnosis not present

## 2020-09-09 DIAGNOSIS — Z23 Encounter for immunization: Secondary | ICD-10-CM | POA: Diagnosis not present

## 2020-09-25 DIAGNOSIS — H35033 Hypertensive retinopathy, bilateral: Secondary | ICD-10-CM | POA: Diagnosis not present

## 2020-09-25 DIAGNOSIS — I10 Essential (primary) hypertension: Secondary | ICD-10-CM | POA: Diagnosis not present

## 2020-09-25 DIAGNOSIS — E7800 Pure hypercholesterolemia, unspecified: Secondary | ICD-10-CM | POA: Diagnosis not present

## 2020-10-07 DIAGNOSIS — I10 Essential (primary) hypertension: Secondary | ICD-10-CM | POA: Diagnosis not present

## 2020-10-07 DIAGNOSIS — Z79899 Other long term (current) drug therapy: Secondary | ICD-10-CM | POA: Diagnosis not present

## 2020-10-07 DIAGNOSIS — R413 Other amnesia: Secondary | ICD-10-CM | POA: Diagnosis not present

## 2020-10-07 DIAGNOSIS — G3184 Mild cognitive impairment, so stated: Secondary | ICD-10-CM | POA: Diagnosis not present

## 2020-10-07 DIAGNOSIS — E7800 Pure hypercholesterolemia, unspecified: Secondary | ICD-10-CM | POA: Diagnosis not present

## 2020-10-24 DIAGNOSIS — E7800 Pure hypercholesterolemia, unspecified: Secondary | ICD-10-CM | POA: Diagnosis not present

## 2020-10-24 DIAGNOSIS — I10 Essential (primary) hypertension: Secondary | ICD-10-CM | POA: Diagnosis not present

## 2020-10-24 DIAGNOSIS — H35033 Hypertensive retinopathy, bilateral: Secondary | ICD-10-CM | POA: Diagnosis not present

## 2020-11-15 DIAGNOSIS — H6122 Impacted cerumen, left ear: Secondary | ICD-10-CM | POA: Diagnosis not present

## 2020-11-15 DIAGNOSIS — E7800 Pure hypercholesterolemia, unspecified: Secondary | ICD-10-CM | POA: Diagnosis not present

## 2020-11-15 DIAGNOSIS — Z79899 Other long term (current) drug therapy: Secondary | ICD-10-CM | POA: Diagnosis not present

## 2020-11-15 DIAGNOSIS — Z0000 Encounter for general adult medical examination without abnormal findings: Secondary | ICD-10-CM | POA: Diagnosis not present

## 2020-11-15 DIAGNOSIS — G3184 Mild cognitive impairment, so stated: Secondary | ICD-10-CM | POA: Diagnosis not present

## 2020-11-15 DIAGNOSIS — I10 Essential (primary) hypertension: Secondary | ICD-10-CM | POA: Diagnosis not present

## 2020-11-15 DIAGNOSIS — H35033 Hypertensive retinopathy, bilateral: Secondary | ICD-10-CM | POA: Diagnosis not present

## 2020-11-15 DIAGNOSIS — Z1389 Encounter for screening for other disorder: Secondary | ICD-10-CM | POA: Diagnosis not present

## 2020-11-20 DIAGNOSIS — H6123 Impacted cerumen, bilateral: Secondary | ICD-10-CM | POA: Diagnosis not present

## 2020-11-22 DIAGNOSIS — I10 Essential (primary) hypertension: Secondary | ICD-10-CM | POA: Diagnosis not present

## 2020-11-22 DIAGNOSIS — E7800 Pure hypercholesterolemia, unspecified: Secondary | ICD-10-CM | POA: Diagnosis not present

## 2020-11-22 DIAGNOSIS — H35033 Hypertensive retinopathy, bilateral: Secondary | ICD-10-CM | POA: Diagnosis not present

## 2020-12-26 DIAGNOSIS — H35033 Hypertensive retinopathy, bilateral: Secondary | ICD-10-CM | POA: Diagnosis not present

## 2020-12-26 DIAGNOSIS — E7800 Pure hypercholesterolemia, unspecified: Secondary | ICD-10-CM | POA: Diagnosis not present

## 2020-12-26 DIAGNOSIS — I10 Essential (primary) hypertension: Secondary | ICD-10-CM | POA: Diagnosis not present

## 2021-01-20 DIAGNOSIS — E7800 Pure hypercholesterolemia, unspecified: Secondary | ICD-10-CM | POA: Diagnosis not present

## 2021-01-20 DIAGNOSIS — I10 Essential (primary) hypertension: Secondary | ICD-10-CM | POA: Diagnosis not present

## 2021-01-20 DIAGNOSIS — H35033 Hypertensive retinopathy, bilateral: Secondary | ICD-10-CM | POA: Diagnosis not present

## 2021-02-24 DIAGNOSIS — H35033 Hypertensive retinopathy, bilateral: Secondary | ICD-10-CM | POA: Diagnosis not present

## 2021-02-24 DIAGNOSIS — I10 Essential (primary) hypertension: Secondary | ICD-10-CM | POA: Diagnosis not present

## 2021-02-24 DIAGNOSIS — E7800 Pure hypercholesterolemia, unspecified: Secondary | ICD-10-CM | POA: Diagnosis not present

## 2021-03-21 DIAGNOSIS — H35033 Hypertensive retinopathy, bilateral: Secondary | ICD-10-CM | POA: Diagnosis not present

## 2021-03-21 DIAGNOSIS — E7800 Pure hypercholesterolemia, unspecified: Secondary | ICD-10-CM | POA: Diagnosis not present

## 2021-03-21 DIAGNOSIS — I10 Essential (primary) hypertension: Secondary | ICD-10-CM | POA: Diagnosis not present

## 2021-03-28 DIAGNOSIS — N1330 Unspecified hydronephrosis: Secondary | ICD-10-CM | POA: Diagnosis not present

## 2021-03-28 DIAGNOSIS — N312 Flaccid neuropathic bladder, not elsewhere classified: Secondary | ICD-10-CM | POA: Diagnosis not present

## 2021-03-28 DIAGNOSIS — Z8546 Personal history of malignant neoplasm of prostate: Secondary | ICD-10-CM | POA: Diagnosis not present

## 2021-04-04 DIAGNOSIS — I10 Essential (primary) hypertension: Secondary | ICD-10-CM | POA: Diagnosis not present

## 2021-04-04 DIAGNOSIS — E7800 Pure hypercholesterolemia, unspecified: Secondary | ICD-10-CM | POA: Diagnosis not present

## 2021-04-04 DIAGNOSIS — I700 Atherosclerosis of aorta: Secondary | ICD-10-CM | POA: Diagnosis not present

## 2021-04-21 DIAGNOSIS — I10 Essential (primary) hypertension: Secondary | ICD-10-CM | POA: Diagnosis not present

## 2021-04-21 DIAGNOSIS — H35033 Hypertensive retinopathy, bilateral: Secondary | ICD-10-CM | POA: Diagnosis not present

## 2021-04-21 DIAGNOSIS — E7800 Pure hypercholesterolemia, unspecified: Secondary | ICD-10-CM | POA: Diagnosis not present

## 2021-05-20 DIAGNOSIS — I10 Essential (primary) hypertension: Secondary | ICD-10-CM | POA: Diagnosis not present

## 2021-05-20 DIAGNOSIS — E7800 Pure hypercholesterolemia, unspecified: Secondary | ICD-10-CM | POA: Diagnosis not present

## 2021-05-20 DIAGNOSIS — H35033 Hypertensive retinopathy, bilateral: Secondary | ICD-10-CM | POA: Diagnosis not present

## 2021-06-21 DIAGNOSIS — I10 Essential (primary) hypertension: Secondary | ICD-10-CM | POA: Diagnosis not present

## 2021-06-21 DIAGNOSIS — H35033 Hypertensive retinopathy, bilateral: Secondary | ICD-10-CM | POA: Diagnosis not present

## 2021-06-21 DIAGNOSIS — E7800 Pure hypercholesterolemia, unspecified: Secondary | ICD-10-CM | POA: Diagnosis not present

## 2021-07-20 DIAGNOSIS — R030 Elevated blood-pressure reading, without diagnosis of hypertension: Secondary | ICD-10-CM | POA: Diagnosis not present

## 2021-07-20 DIAGNOSIS — Z87891 Personal history of nicotine dependence: Secondary | ICD-10-CM | POA: Diagnosis not present

## 2021-07-20 DIAGNOSIS — E785 Hyperlipidemia, unspecified: Secondary | ICD-10-CM | POA: Diagnosis not present

## 2021-07-21 DIAGNOSIS — H35033 Hypertensive retinopathy, bilateral: Secondary | ICD-10-CM | POA: Diagnosis not present

## 2021-07-21 DIAGNOSIS — E7800 Pure hypercholesterolemia, unspecified: Secondary | ICD-10-CM | POA: Diagnosis not present

## 2021-07-21 DIAGNOSIS — I10 Essential (primary) hypertension: Secondary | ICD-10-CM | POA: Diagnosis not present

## 2021-08-08 DIAGNOSIS — H04123 Dry eye syndrome of bilateral lacrimal glands: Secondary | ICD-10-CM | POA: Diagnosis not present

## 2021-08-08 DIAGNOSIS — H35033 Hypertensive retinopathy, bilateral: Secondary | ICD-10-CM | POA: Diagnosis not present

## 2021-08-08 DIAGNOSIS — H3554 Dystrophies primarily involving the retinal pigment epithelium: Secondary | ICD-10-CM | POA: Diagnosis not present

## 2021-08-08 DIAGNOSIS — H353132 Nonexudative age-related macular degeneration, bilateral, intermediate dry stage: Secondary | ICD-10-CM | POA: Diagnosis not present

## 2021-08-15 DIAGNOSIS — I10 Essential (primary) hypertension: Secondary | ICD-10-CM | POA: Diagnosis not present

## 2021-08-15 DIAGNOSIS — H35033 Hypertensive retinopathy, bilateral: Secondary | ICD-10-CM | POA: Diagnosis not present

## 2021-08-15 DIAGNOSIS — E7800 Pure hypercholesterolemia, unspecified: Secondary | ICD-10-CM | POA: Diagnosis not present

## 2021-09-17 DIAGNOSIS — E7800 Pure hypercholesterolemia, unspecified: Secondary | ICD-10-CM | POA: Diagnosis not present

## 2021-09-17 DIAGNOSIS — H35033 Hypertensive retinopathy, bilateral: Secondary | ICD-10-CM | POA: Diagnosis not present

## 2021-09-17 DIAGNOSIS — I10 Essential (primary) hypertension: Secondary | ICD-10-CM | POA: Diagnosis not present

## 2021-10-20 DIAGNOSIS — H35033 Hypertensive retinopathy, bilateral: Secondary | ICD-10-CM | POA: Diagnosis not present

## 2021-10-20 DIAGNOSIS — E7800 Pure hypercholesterolemia, unspecified: Secondary | ICD-10-CM | POA: Diagnosis not present

## 2021-10-20 DIAGNOSIS — I10 Essential (primary) hypertension: Secondary | ICD-10-CM | POA: Diagnosis not present

## 2021-11-17 DIAGNOSIS — H35033 Hypertensive retinopathy, bilateral: Secondary | ICD-10-CM | POA: Diagnosis not present

## 2021-11-17 DIAGNOSIS — I10 Essential (primary) hypertension: Secondary | ICD-10-CM | POA: Diagnosis not present

## 2021-11-17 DIAGNOSIS — E7800 Pure hypercholesterolemia, unspecified: Secondary | ICD-10-CM | POA: Diagnosis not present

## 2021-11-25 DIAGNOSIS — Z79899 Other long term (current) drug therapy: Secondary | ICD-10-CM | POA: Diagnosis not present

## 2021-11-25 DIAGNOSIS — I700 Atherosclerosis of aorta: Secondary | ICD-10-CM | POA: Diagnosis not present

## 2021-11-25 DIAGNOSIS — G3184 Mild cognitive impairment, so stated: Secondary | ICD-10-CM | POA: Diagnosis not present

## 2021-11-25 DIAGNOSIS — I10 Essential (primary) hypertension: Secondary | ICD-10-CM | POA: Diagnosis not present

## 2021-11-25 DIAGNOSIS — H35033 Hypertensive retinopathy, bilateral: Secondary | ICD-10-CM | POA: Diagnosis not present

## 2021-11-25 DIAGNOSIS — Z1389 Encounter for screening for other disorder: Secondary | ICD-10-CM | POA: Diagnosis not present

## 2021-11-25 DIAGNOSIS — E7800 Pure hypercholesterolemia, unspecified: Secondary | ICD-10-CM | POA: Diagnosis not present

## 2021-11-25 DIAGNOSIS — Z0000 Encounter for general adult medical examination without abnormal findings: Secondary | ICD-10-CM | POA: Diagnosis not present

## 2021-12-12 DIAGNOSIS — N312 Flaccid neuropathic bladder, not elsewhere classified: Secondary | ICD-10-CM | POA: Diagnosis not present

## 2021-12-12 DIAGNOSIS — Z8546 Personal history of malignant neoplasm of prostate: Secondary | ICD-10-CM | POA: Diagnosis not present

## 2021-12-12 DIAGNOSIS — N139 Obstructive and reflux uropathy, unspecified: Secondary | ICD-10-CM | POA: Diagnosis not present

## 2022-01-13 DIAGNOSIS — I10 Essential (primary) hypertension: Secondary | ICD-10-CM | POA: Diagnosis not present

## 2022-01-13 DIAGNOSIS — E7800 Pure hypercholesterolemia, unspecified: Secondary | ICD-10-CM | POA: Diagnosis not present

## 2022-02-18 DIAGNOSIS — G3184 Mild cognitive impairment, so stated: Secondary | ICD-10-CM | POA: Diagnosis not present

## 2022-02-18 DIAGNOSIS — H35033 Hypertensive retinopathy, bilateral: Secondary | ICD-10-CM | POA: Diagnosis not present

## 2022-02-19 DIAGNOSIS — E7800 Pure hypercholesterolemia, unspecified: Secondary | ICD-10-CM | POA: Diagnosis not present

## 2022-02-19 DIAGNOSIS — I10 Essential (primary) hypertension: Secondary | ICD-10-CM | POA: Diagnosis not present

## 2022-02-20 ENCOUNTER — Other Ambulatory Visit: Payer: Self-pay | Admitting: Geriatric Medicine

## 2022-02-20 ENCOUNTER — Ambulatory Visit
Admission: RE | Admit: 2022-02-20 | Discharge: 2022-02-20 | Disposition: A | Discharge status: Home / Self Care | Payer: Medicare PPO | Source: Ambulatory Visit | Attending: Geriatric Medicine | Admitting: Geriatric Medicine

## 2022-02-20 DIAGNOSIS — R0781 Pleurodynia: Secondary | ICD-10-CM

## 2022-02-20 DIAGNOSIS — J9811 Atelectasis: Secondary | ICD-10-CM | POA: Diagnosis not present

## 2022-02-24 DIAGNOSIS — R030 Elevated blood-pressure reading, without diagnosis of hypertension: Secondary | ICD-10-CM | POA: Diagnosis not present

## 2022-02-24 DIAGNOSIS — D649 Anemia, unspecified: Secondary | ICD-10-CM | POA: Diagnosis not present

## 2022-02-24 DIAGNOSIS — G3184 Mild cognitive impairment, so stated: Secondary | ICD-10-CM | POA: Diagnosis not present

## 2022-02-24 DIAGNOSIS — Z8249 Family history of ischemic heart disease and other diseases of the circulatory system: Secondary | ICD-10-CM | POA: Diagnosis not present

## 2022-02-24 DIAGNOSIS — E785 Hyperlipidemia, unspecified: Secondary | ICD-10-CM | POA: Diagnosis not present

## 2022-02-24 DIAGNOSIS — Z87891 Personal history of nicotine dependence: Secondary | ICD-10-CM | POA: Diagnosis not present

## 2022-02-24 DIAGNOSIS — N390 Urinary tract infection, site not specified: Secondary | ICD-10-CM | POA: Diagnosis not present

## 2022-03-06 DIAGNOSIS — G301 Alzheimer's disease with late onset: Secondary | ICD-10-CM | POA: Diagnosis not present

## 2022-03-06 DIAGNOSIS — I10 Essential (primary) hypertension: Secondary | ICD-10-CM | POA: Diagnosis not present

## 2022-03-06 DIAGNOSIS — F02B0 Dementia in other diseases classified elsewhere, moderate, without behavioral disturbance, psychotic disturbance, mood disturbance, and anxiety: Secondary | ICD-10-CM | POA: Diagnosis not present

## 2022-03-12 DIAGNOSIS — I10 Essential (primary) hypertension: Secondary | ICD-10-CM | POA: Diagnosis not present

## 2022-03-12 DIAGNOSIS — D649 Anemia, unspecified: Secondary | ICD-10-CM | POA: Diagnosis not present

## 2022-03-12 DIAGNOSIS — E7800 Pure hypercholesterolemia, unspecified: Secondary | ICD-10-CM | POA: Diagnosis not present

## 2022-04-13 DIAGNOSIS — I10 Essential (primary) hypertension: Secondary | ICD-10-CM | POA: Diagnosis not present

## 2022-04-13 DIAGNOSIS — E7800 Pure hypercholesterolemia, unspecified: Secondary | ICD-10-CM | POA: Diagnosis not present

## 2022-05-28 DIAGNOSIS — Z681 Body mass index (BMI) 19 or less, adult: Secondary | ICD-10-CM | POA: Diagnosis not present

## 2022-05-28 DIAGNOSIS — H6123 Impacted cerumen, bilateral: Secondary | ICD-10-CM | POA: Diagnosis not present

## 2022-07-09 DIAGNOSIS — E7800 Pure hypercholesterolemia, unspecified: Secondary | ICD-10-CM | POA: Diagnosis not present

## 2022-07-09 DIAGNOSIS — I10 Essential (primary) hypertension: Secondary | ICD-10-CM | POA: Diagnosis not present

## 2022-08-06 DIAGNOSIS — L821 Other seborrheic keratosis: Secondary | ICD-10-CM | POA: Diagnosis not present

## 2022-08-06 DIAGNOSIS — C44519 Basal cell carcinoma of skin of other part of trunk: Secondary | ICD-10-CM | POA: Diagnosis not present

## 2022-08-06 DIAGNOSIS — Z85820 Personal history of malignant melanoma of skin: Secondary | ICD-10-CM | POA: Diagnosis not present

## 2022-08-06 DIAGNOSIS — L570 Actinic keratosis: Secondary | ICD-10-CM | POA: Diagnosis not present

## 2022-08-06 DIAGNOSIS — D2239 Melanocytic nevi of other parts of face: Secondary | ICD-10-CM | POA: Diagnosis not present

## 2022-08-06 DIAGNOSIS — Z85828 Personal history of other malignant neoplasm of skin: Secondary | ICD-10-CM | POA: Diagnosis not present

## 2022-08-06 DIAGNOSIS — C44612 Basal cell carcinoma of skin of right upper limb, including shoulder: Secondary | ICD-10-CM | POA: Diagnosis not present

## 2022-08-06 DIAGNOSIS — C44319 Basal cell carcinoma of skin of other parts of face: Secondary | ICD-10-CM | POA: Diagnosis not present

## 2022-08-06 DIAGNOSIS — D485 Neoplasm of uncertain behavior of skin: Secondary | ICD-10-CM | POA: Diagnosis not present

## 2022-08-11 DIAGNOSIS — H43813 Vitreous degeneration, bilateral: Secondary | ICD-10-CM | POA: Diagnosis not present

## 2022-08-11 DIAGNOSIS — H353132 Nonexudative age-related macular degeneration, bilateral, intermediate dry stage: Secondary | ICD-10-CM | POA: Diagnosis not present

## 2022-08-11 DIAGNOSIS — H3554 Dystrophies primarily involving the retinal pigment epithelium: Secondary | ICD-10-CM | POA: Diagnosis not present

## 2022-08-11 DIAGNOSIS — H04123 Dry eye syndrome of bilateral lacrimal glands: Secondary | ICD-10-CM | POA: Diagnosis not present

## 2022-09-17 DIAGNOSIS — I10 Essential (primary) hypertension: Secondary | ICD-10-CM | POA: Diagnosis not present

## 2022-09-17 DIAGNOSIS — E7800 Pure hypercholesterolemia, unspecified: Secondary | ICD-10-CM | POA: Diagnosis not present

## 2022-09-17 DIAGNOSIS — G301 Alzheimer's disease with late onset: Secondary | ICD-10-CM | POA: Diagnosis not present

## 2022-09-24 DIAGNOSIS — G3184 Mild cognitive impairment, so stated: Secondary | ICD-10-CM | POA: Diagnosis not present

## 2022-09-24 DIAGNOSIS — G301 Alzheimer's disease with late onset: Secondary | ICD-10-CM | POA: Diagnosis not present

## 2022-10-19 DIAGNOSIS — Z111 Encounter for screening for respiratory tuberculosis: Secondary | ICD-10-CM | POA: Diagnosis not present

## 2022-11-24 ENCOUNTER — Emergency Department (HOSPITAL_COMMUNITY): Payer: Medicare PPO

## 2022-11-24 ENCOUNTER — Encounter (HOSPITAL_COMMUNITY): Payer: Self-pay

## 2022-11-24 ENCOUNTER — Other Ambulatory Visit: Payer: Self-pay

## 2022-11-24 ENCOUNTER — Inpatient Hospital Stay (HOSPITAL_COMMUNITY)
Admission: EM | Admit: 2022-11-24 | Discharge: 2022-12-01 | DRG: 065 | Disposition: A | Discharge status: Skilled Nursing Facility | Payer: Medicare PPO | Source: Skilled Nursing Facility | Attending: Internal Medicine | Admitting: Internal Medicine

## 2022-11-24 ENCOUNTER — Observation Stay (HOSPITAL_COMMUNITY): Payer: Medicare PPO

## 2022-11-24 DIAGNOSIS — Z79899 Other long term (current) drug therapy: Secondary | ICD-10-CM

## 2022-11-24 DIAGNOSIS — R29709 NIHSS score 9: Secondary | ICD-10-CM | POA: Diagnosis not present

## 2022-11-24 DIAGNOSIS — N179 Acute kidney failure, unspecified: Secondary | ICD-10-CM

## 2022-11-24 DIAGNOSIS — I6381 Other cerebral infarction due to occlusion or stenosis of small artery: Secondary | ICD-10-CM | POA: Diagnosis not present

## 2022-11-24 DIAGNOSIS — M4854XA Collapsed vertebra, not elsewhere classified, thoracic region, initial encounter for fracture: Secondary | ICD-10-CM | POA: Diagnosis present

## 2022-11-24 DIAGNOSIS — R030 Elevated blood-pressure reading, without diagnosis of hypertension: Secondary | ICD-10-CM

## 2022-11-24 DIAGNOSIS — Y92099 Unspecified place in other non-institutional residence as the place of occurrence of the external cause: Secondary | ICD-10-CM

## 2022-11-24 DIAGNOSIS — Z87440 Personal history of urinary (tract) infections: Secondary | ICD-10-CM

## 2022-11-24 DIAGNOSIS — R29707 NIHSS score 7: Secondary | ICD-10-CM | POA: Diagnosis not present

## 2022-11-24 DIAGNOSIS — I341 Nonrheumatic mitral (valve) prolapse: Secondary | ICD-10-CM | POA: Diagnosis present

## 2022-11-24 DIAGNOSIS — Z8719 Personal history of other diseases of the digestive system: Secondary | ICD-10-CM

## 2022-11-24 DIAGNOSIS — W19XXXA Unspecified fall, initial encounter: Secondary | ICD-10-CM | POA: Diagnosis present

## 2022-11-24 DIAGNOSIS — R1310 Dysphagia, unspecified: Secondary | ICD-10-CM | POA: Diagnosis present

## 2022-11-24 DIAGNOSIS — Z9079 Acquired absence of other genital organ(s): Secondary | ICD-10-CM

## 2022-11-24 DIAGNOSIS — Z87891 Personal history of nicotine dependence: Secondary | ICD-10-CM

## 2022-11-24 DIAGNOSIS — Z8673 Personal history of transient ischemic attack (TIA), and cerebral infarction without residual deficits: Secondary | ICD-10-CM

## 2022-11-24 DIAGNOSIS — Z9842 Cataract extraction status, left eye: Secondary | ICD-10-CM

## 2022-11-24 DIAGNOSIS — F05 Delirium due to known physiological condition: Secondary | ICD-10-CM | POA: Diagnosis present

## 2022-11-24 DIAGNOSIS — F0390 Unspecified dementia without behavioral disturbance: Secondary | ICD-10-CM | POA: Diagnosis present

## 2022-11-24 DIAGNOSIS — Z8546 Personal history of malignant neoplasm of prostate: Secondary | ICD-10-CM

## 2022-11-24 DIAGNOSIS — E860 Dehydration: Secondary | ICD-10-CM | POA: Diagnosis present

## 2022-11-24 DIAGNOSIS — R29810 Facial weakness: Secondary | ICD-10-CM | POA: Diagnosis present

## 2022-11-24 DIAGNOSIS — I639 Cerebral infarction, unspecified: Secondary | ICD-10-CM | POA: Diagnosis present

## 2022-11-24 DIAGNOSIS — R270 Ataxia, unspecified: Secondary | ICD-10-CM | POA: Diagnosis present

## 2022-11-24 DIAGNOSIS — N320 Bladder-neck obstruction: Secondary | ICD-10-CM | POA: Diagnosis present

## 2022-11-24 DIAGNOSIS — R29706 NIHSS score 6: Secondary | ICD-10-CM | POA: Diagnosis present

## 2022-11-24 DIAGNOSIS — Z961 Presence of intraocular lens: Secondary | ICD-10-CM | POA: Diagnosis present

## 2022-11-24 DIAGNOSIS — R339 Retention of urine, unspecified: Secondary | ICD-10-CM | POA: Diagnosis present

## 2022-11-24 DIAGNOSIS — Z86010 Personal history of colonic polyps: Secondary | ICD-10-CM

## 2022-11-24 DIAGNOSIS — Z9841 Cataract extraction status, right eye: Secondary | ICD-10-CM

## 2022-11-24 DIAGNOSIS — N319 Neuromuscular dysfunction of bladder, unspecified: Secondary | ICD-10-CM | POA: Diagnosis present

## 2022-11-24 DIAGNOSIS — R338 Other retention of urine: Secondary | ICD-10-CM

## 2022-11-24 DIAGNOSIS — I6329 Cerebral infarction due to unspecified occlusion or stenosis of other precerebral arteries: Principal | ICD-10-CM | POA: Diagnosis present

## 2022-11-24 DIAGNOSIS — G8194 Hemiplegia, unspecified affecting left nondominant side: Secondary | ICD-10-CM | POA: Diagnosis not present

## 2022-11-24 DIAGNOSIS — R29705 NIHSS score 5: Secondary | ICD-10-CM | POA: Diagnosis not present

## 2022-11-24 LAB — APTT: aPTT: 29 seconds (ref 24–36)

## 2022-11-24 LAB — URINALYSIS, ROUTINE W REFLEX MICROSCOPIC
Bilirubin Urine: NEGATIVE
Glucose, UA: NEGATIVE mg/dL
Ketones, ur: 20 mg/dL — AB
Leukocytes,Ua: NEGATIVE
Nitrite: NEGATIVE
Protein, ur: NEGATIVE mg/dL
RBC / HPF: >50 RBC/hpf — ABNORMAL HIGH (ref 0–5)
Specific Gravity, Urine: 1.018 (ref 1.005–1.030)
pH: 5 (ref 5.0–8.0)

## 2022-11-24 LAB — CBC WITH DIFFERENTIAL/PLATELET
Abs Immature Granulocytes: 0.12 10*3/uL — ABNORMAL HIGH (ref 0.00–0.07)
Basophils Absolute: 0.1 10*3/uL (ref 0.0–0.1)
Basophils Relative: 1 %
Eosinophils Absolute: 0 10*3/uL (ref 0.0–0.5)
Eosinophils Relative: 0 %
HCT: 43.2 % (ref 39.0–52.0)
Hemoglobin: 14.6 g/dL (ref 13.0–17.0)
Immature Granulocytes: 1 %
Lymphocytes Relative: 8 %
Lymphs Abs: 1 10*3/uL (ref 0.7–4.0)
MCH: 32.4 pg (ref 26.0–34.0)
MCHC: 33.8 g/dL (ref 30.0–36.0)
MCV: 95.8 fL (ref 80.0–100.0)
Monocytes Absolute: 0.6 10*3/uL (ref 0.1–1.0)
Monocytes Relative: 5 %
Neutro Abs: 11.1 10*3/uL — ABNORMAL HIGH (ref 1.7–7.7)
Neutrophils Relative %: 85 %
Platelets: 212 10*3/uL (ref 150–400)
RBC: 4.51 MIL/uL (ref 4.22–5.81)
RDW: 12.8 % (ref 11.5–15.5)
WBC: 12.9 10*3/uL — ABNORMAL HIGH (ref 4.0–10.5)
nRBC: 0 % (ref 0.0–0.2)

## 2022-11-24 LAB — COMPREHENSIVE METABOLIC PANEL
ALT: 20 U/L (ref 0–44)
AST: 21 U/L (ref 15–41)
Albumin: 4.1 g/dL (ref 3.5–5.0)
Alkaline Phosphatase: 54 U/L (ref 38–126)
Anion gap: 10 (ref 5–15)
BUN: 29 mg/dL — ABNORMAL HIGH (ref 8–23)
CO2: 23 mmol/L (ref 22–32)
Calcium: 9.6 mg/dL (ref 8.9–10.3)
Chloride: 105 mmol/L (ref 98–111)
Creatinine, Ser: 1.32 mg/dL — ABNORMAL HIGH (ref 0.61–1.24)
GFR, Estimated: 52 mL/min — ABNORMAL LOW (ref >60)
Glucose, Bld: 98 mg/dL (ref 70–99)
Potassium: 4.3 mmol/L (ref 3.5–5.1)
Sodium: 138 mmol/L (ref 135–145)
Total Bilirubin: 2 mg/dL — ABNORMAL HIGH (ref 0.3–1.2)
Total Protein: 6.9 g/dL (ref 6.5–8.1)

## 2022-11-24 LAB — PROTIME-INR
INR: 1.3 — ABNORMAL HIGH (ref 0.8–1.2)
Prothrombin Time: 15.6 seconds — ABNORMAL HIGH (ref 11.4–15.2)

## 2022-11-24 LAB — HEMOGLOBIN A1C
Hgb A1c MFr Bld: 5.2 % (ref 4.8–5.6)
Mean Plasma Glucose: 102.54 mg/dL

## 2022-11-24 LAB — TROPONIN I (HIGH SENSITIVITY)
Troponin I (High Sensitivity): 11 ng/L (ref <18)
Troponin I (High Sensitivity): 14 ng/L (ref <18)

## 2022-11-24 LAB — CBG MONITORING, ED: Glucose-Capillary: 103 mg/dL — ABNORMAL HIGH (ref 70–99)

## 2022-11-24 MED ORDER — ENOXAPARIN SODIUM 40 MG/0.4ML IJ SOSY
40.0000 mg | PREFILLED_SYRINGE | INTRAMUSCULAR | Status: DC
  Administered 2022-11-24 – 2022-11-30 (×7): 40 mg via SUBCUTANEOUS
  Filled 2022-11-24 (×7): qty 0.4

## 2022-11-24 MED ORDER — STROKE: EARLY STAGES OF RECOVERY BOOK: Freq: Once | Status: AC

## 2022-11-24 MED ORDER — ACETAMINOPHEN 160 MG/5ML PO SOLN: 650.0000 mg | ORAL | Status: DC | PRN

## 2022-11-24 MED ORDER — SODIUM CHLORIDE 0.9% FLUSH: 3.0000 mL | Freq: Once | INTRAVENOUS | Status: DC

## 2022-11-24 MED ORDER — SODIUM CHLORIDE 0.9 % IV BOLUS
500.0000 mL | Freq: Once | INTRAVENOUS | Status: AC
  Administered 2022-11-24: 500 mL via INTRAVENOUS

## 2022-11-24 MED ORDER — ACETAMINOPHEN 325 MG PO TABS: 650.0000 mg | ORAL_TABLET | ORAL | Status: DC | PRN

## 2022-11-24 MED ORDER — IOHEXOL 350 MG/ML SOLN
75.0000 mL | Freq: Once | INTRAVENOUS | Status: AC | PRN
  Administered 2022-11-24: 75 mL via INTRAVENOUS

## 2022-11-24 MED ORDER — ACETAMINOPHEN 650 MG RE SUPP: 650.0000 mg | RECTAL | Status: DC | PRN

## 2022-11-24 MED ORDER — SODIUM CHLORIDE 0.9 % IV SOLN
2.0000 g | Freq: Once | INTRAVENOUS | Status: AC
  Administered 2022-11-24: 2 g via INTRAVENOUS
  Filled 2022-11-24: qty 20

## 2022-11-24 NOTE — ED Notes (Signed)
Pt attempted to urinate, only able to dribble a small amount of urine. Bladder feels firm on palpation. Bladder scan 625. MD Schlossman made aware.

## 2022-11-24 NOTE — Consult Note (Signed)
H&P Physician requesting consult: Ileene Musa  Chief Complaint: Urinary retention  History of Present Illness: 89 year old male admitted with stroke.  Nursing unable to place catheter.  He has a history of prostatectomy and bladder neck contracture.  Dr. Junious Silk took him to the operating room in 2007 for gross hematuria and bladder neck contracture.  Past Medical History:  Diagnosis Date   Anemia due to acute blood loss    gross hematuria   Bladder neck contracture    chronic   Diverticulosis of colon    Foley catheter in place    placed 07-11-2016   Gross hematuria    History of cerebral infarction    per CT 05-18-2012  chronic lacunar infarcts in basal ganglia  (silent?,  pt denies history S&S)   History of colon polyps    2004   History of diverticulitis    2014   History of prostate cancer 2003   s/p  radical prostatectomy 03-21-2002--  no recurrence (last PSA 07/2016  0.01)   History of recurrent UTIs    History of urinary retention    Neurogenic bladder    since prostatectomy 2003   Self-catheterizes urinary bladder    SECONDARY TO NEUROGENIC BLADDER    Urinary retention    Wears glasses    Past Surgical History:  Procedure Laterality Date   CATARACT EXTRACTION W/ INTRAOCULAR LENS  IMPLANT, BILATERAL  2010   CYSTO/  DILATATION BLADDER NECK CONTRACTURE  06/02/2002   CYSTOSCOPY W/ RETROGRADES  07/21/2016   Procedure: CYSTOSCOPY WITH RETROGRADE PYELOGRAM, CLOT EVACUATION, BLADDER BIOPSY WITH FULGERATION;  Surgeon: Festus Aloe, MD;  Location: [facility];  Service: Urology;;  0.5 TO 2 CM    EYE SURGERY Right yrs ago   PROSTATE SURGERY  yrs ago   [place] /  REPAIR RECTAL PERFORATION  03-21-2002  dr Jori Moll davis   TRANSURETHRAL RESECTION OF PROSTATE  09/06/2001    Home Medications:  (Not in a hospital admission)  Allergies: No Known Allergies  History reviewed. No pertinent family history. Social History:  reports that he  quit smoking about 65 years ago. His smoking use included cigarettes. He has a 8.00 pack-year smoking history. He quit smokeless tobacco use about 43 years ago.  His smokeless tobacco use included snuff. He reports current alcohol use. He reports that he does not use drugs.  ROS: A complete review of systems was performed.  All systems are negative except for pertinent findings as noted. ROS   Physical Exam:  Vital signs in last 24 hours: Temp:  [97.6 F (36.4 C)-98.2 F (36.8 C)] 98.2 F (36.8 C) (01/16 1917) Pulse Rate:  [70-86] 74 (01/16 2000) Resp:  [14-20] 16 (01/16 2000) BP: (161-187)/(78-111) 187/83 (01/16 2000) SpO2:  [97 %-100 %] 100 % (01/16 2000) General:  Alert and oriented, No acute distress HEENT: Normocephalic, atraumatic Neck: No JVD or lymphadenopathy Cardiovascular: Regular rate and rhythm Lungs: Regular rate and effort Abdomen: Soft, nontender, nondistended, no abdominal masses Uncircumcised phallus.  Bilateral testicles descended without masses Back: No CVA tenderness Extremities: No edema  Laboratory Data:  Results for orders placed or performed during the hospital encounter of 11/24/22 (from the past 24 hour(s))  CBC with Differential     Status: Abnormal   Collection Time: 11/24/22  3:35 PM  Result Value Ref Range   WBC 12.9 (H) 4.0 - 10.5 K/uL   RBC 4.51 4.22 - 5.81 MIL/uL   Hemoglobin 14.6 13.0 - 17.0 g/dL  HCT 43.2 39.0 - 52.0 %   MCV 95.8 80.0 - 100.0 fL   MCH 32.4 26.0 - 34.0 pg   MCHC 33.8 30.0 - 36.0 g/dL   RDW 12.8 11.5 - 15.5 %   Platelets 212 150 - 400 K/uL   nRBC 0.0 0.0 - 0.2 %   Neutrophils Relative % 85 %   Neutro Abs 11.1 (H) 1.7 - 7.7 K/uL   Lymphocytes Relative 8 %   Lymphs Abs 1.0 0.7 - 4.0 K/uL   Monocytes Relative 5 %   Monocytes Absolute 0.6 0.1 - 1.0 K/uL   Eosinophils Relative 0 %   Eosinophils Absolute 0.0 0.0 - 0.5 K/uL   Basophils Relative 1 %   Basophils Absolute 0.1 0.0 - 0.1 K/uL   Immature Granulocytes 1 %    Abs Immature Granulocytes 0.12 (H) 0.00 - 0.07 K/uL  Comprehensive metabolic panel     Status: Abnormal   Collection Time: 11/24/22  3:35 PM  Result Value Ref Range   Sodium 138 135 - 145 mmol/L   Potassium 4.3 3.5 - 5.1 mmol/L   Chloride 105 98 - 111 mmol/L   CO2 23 22 - 32 mmol/L   Glucose, Bld 98 70 - 99 mg/dL   BUN 29 (H) 8 - 23 mg/dL   Creatinine, Ser 1.32 (H) 0.61 - 1.24 mg/dL   Calcium 9.6 8.9 - 10.3 mg/dL   Total Protein 6.9 6.5 - 8.1 g/dL   Albumin 4.1 3.5 - 5.0 g/dL   AST 21 15 - 41 U/L   ALT 20 0 - 44 U/L   Alkaline Phosphatase 54 38 - 126 U/L   Total Bilirubin 2.0 (H) 0.3 - 1.2 mg/dL   GFR, Estimated 52 (L) >60 mL/min   Anion gap 10 5 - 15  Troponin I (High Sensitivity)     Status: None   Collection Time: 11/24/22  3:35 PM  Result Value Ref Range   Troponin I (High Sensitivity) 11 <18 ng/L  Protime-INR     Status: Abnormal   Collection Time: 11/24/22  3:35 PM  Result Value Ref Range   Prothrombin Time 15.6 (H) 11.4 - 15.2 seconds   INR 1.3 (H) 0.8 - 1.2  APTT     Status: None   Collection Time: 11/24/22  3:35 PM  Result Value Ref Range   aPTT 29 24 - 36 seconds  POC CBG, ED     Status: Abnormal   Collection Time: 11/24/22  4:50 PM  Result Value Ref Range   Glucose-Capillary 103 (H) 70 - 99 mg/dL  Troponin I (High Sensitivity)     Status: None   Collection Time: 11/24/22  7:08 PM  Result Value Ref Range   Troponin I (High Sensitivity) 14 <18 ng/L  Hemoglobin A1c     Status: None   Collection Time: 11/24/22  9:50 PM  Result Value Ref Range   Hgb A1c MFr Bld 5.2 4.8 - 5.6 %   Mean Plasma Glucose 102.54 mg/dL   No results found for this or any previous visit (from the past 240 hour(s)). Creatinine: Recent Labs    11/24/22 1535  CREATININE 1.32*   Procedure: Under sterile conditions, I attempted to place a 12 French Foley catheter.  This met resistance at the bladder neck.  I passed a Glidewire into the urethra and into the bladder.  I then passed an  open-ended ureteral catheter over the wire and into the bladder and remove the wire.  There was return  of clear yellow urine.  I reintroduced the wire into the bladder and removed the open-ended ureteral catheter.  I attempted to pass a 21 [place] council tip catheter but this met resistance.  I therefore advanced a balloon dilator into the urethra and transversed the bladder neck.  I dilated the urethra.  I deflated the balloon and removed it.  I again attempted to pass a 74 [place] council tip catheter over the wire but this still met some resistance.  I was able to pass a 12 [place] Foley catheter alongside the wire easily into the bladder with return of clear yellow urine.  Wire was removed.  Catheter placed to bag gravity drainage and secured to the right leg.  Patient tolerated procedure well.  Impression/Assessment:  Urinary retention Bladder neck contracture  Plan:  Keep Foley catheter for approximate 1 week.  May then undergo voiding trial.  He can follow-up in the urology clinic.  I ordered 1 g of ceftriaxone given all of the instrumentation.  No other antibiotics necessary.  Marton Redwood, III 11/24/2022, 10:31 PM

## 2022-11-24 NOTE — ED Notes (Signed)
Pt failed swallow screen, DO Tu made aware.

## 2022-11-24 NOTE — Assessment & Plan Note (Addendum)
-  present with left sided weakness and left facial droop -MRI showed acute right pons infarct.   - obtain CTA head and neck - obtain echocardiogram  - start daily aspirin and Plavix per neurology  -continue Zetia -Obtain A1c and lipids -PT/OT/SLT -Frequent neuro checks and keep on telemetry -Allow for permissive hypertension with blood pressure treatment as needed only if systolic goes above 292

## 2022-11-24 NOTE — H&P (Addendum)
History and Physical    Patient: Trevor Phillips TDV:[id number] DOB: 09-27-1931 DOA: 11/24/2022 DOS: the patient was seen and examined on 11/25/2022 PCP: Lajean Manes, MD  Patient coming from: ALF/ILF  Chief Complaint:  Chief Complaint  Patient presents with   Fall   Dementia    HPI: Trevor Phillips is a 89 y.o. male with medical history significant of dementia, remote prostate cancer s/p radical prostatectomy, bladder neck contracture, diverticular disease, hx of urinary retention, UTI on methenamine who presents after being found down from unwitnessed fall and noted to have left sided weakness.   History obtained from son Trevor Phillips).  Pt stays at ALF, Sunday had trouble getting up from his chair. Needed help from Son Trevor Phillips) who lives with him to ambulate which is abnormal for him. Monday he seemed to be normal. However today found laying on the floor in his room by RN at facility. No one had been in the room since 9am. Found to have left sided weakness and facial droop. He has dementia and oriented to self and place but not time at baseline. Also has trouble recalling current events.   In the ED, afebrile, BP up to 180s/80.   WBC of 12.9. Hgb of 14.6  No significant electrolyte abnormalities.  Creatinine is elevated at 1.32 without any recent prior for comparison.  EKG in normal sinus rhythm with borderline ST depression throughout.  Troponin of 14.  CT head and cervical spine was negative for acute findings.  MRI showed acute right pons infarct.  Neurology was consulted to evaluate and hospitalist consulted for admission. Review of Systems: As mentioned in the history of present illness. All other systems reviewed and are negative. Past Medical History:  Diagnosis Date   Anemia due to acute blood loss    gross hematuria   Bladder neck contracture    chronic   Diverticulosis of colon    Foley catheter in place    placed 07-11-2016   Gross hematuria    History of  cerebral infarction    per CT 05-18-2012  chronic lacunar infarcts in basal ganglia  (silent?,  pt denies history S&S)   History of colon polyps    2004   History of diverticulitis    2014   History of prostate cancer 2003   s/p  radical prostatectomy 03-21-2002--  no recurrence (last PSA 07/2016  0.01)   History of recurrent UTIs    History of urinary retention    Neurogenic bladder    since prostatectomy 2003   Self-catheterizes urinary bladder    SECONDARY TO NEUROGENIC BLADDER    Urinary retention    Wears glasses    Past Surgical History:  Procedure Laterality Date   CATARACT EXTRACTION W/ INTRAOCULAR LENS  IMPLANT, BILATERAL  2010   CYSTO/  DILATATION BLADDER NECK CONTRACTURE  06/02/2002   CYSTOSCOPY W/ RETROGRADES  07/21/2016   Procedure: CYSTOSCOPY WITH RETROGRADE PYELOGRAM, CLOT EVACUATION, BLADDER BIOPSY WITH FULGERATION;  Surgeon: Festus Aloe, MD;  Location: [facility];  Service: Urology;;  0.5 TO 2 CM    EYE SURGERY Right yrs ago   PROSTATE SURGERY  yrs ago   [place] /  REPAIR RECTAL PERFORATION  03-21-2002  dr Jori Moll davis   TRANSURETHRAL RESECTION OF PROSTATE  09/06/2001   Social History:  reports that he quit smoking about 65 years ago. His smoking use included cigarettes. He has a 8.00 pack-year smoking history. He quit smokeless tobacco use about  43 years ago.  His smokeless tobacco use included snuff. He reports current alcohol use. He reports that he does not use drugs.  No Known Allergies  History reviewed. No pertinent family history.  Prior to Admission medications   Medication Sig Start Date End Date Taking? Authorizing Provider  ampicillin (PRINCIPEN) 500 MG capsule Take 1 capsule (500 mg total) by mouth 3 (three) times daily. 07/13/16   Kelvin Cellar, MD  b complex vitamins tablet Take 1 tablet by mouth every morning.     [provider]  cetirizine (ZYRTEC) 10 MG tablet Take 10 mg by mouth every  morning.     [provider]  Cholecalciferol (VITAMIN D3) 2000 UNITS TABS Take 2,000 Units by mouth daily.    [provider]  Coenzyme Q10 (CO Q-10) 200 MG CAPS Take 200 mg by mouth daily.    [provider]  ezetimibe (ZETIA) 10 MG tablet Take 10 mg by mouth every evening.     [provider]  Glucos-Chond-Hyal Ac-Ca Fructo (MOVE FREE JOINT HEALTH ADVANCE PO) Take 1 capsule by mouth daily.    [provider]  L-Lysine 500 MG TABS Take 500 mg by mouth daily.    [provider]  Lutein-Zeaxanthin 25-5 MG CAPS Take 1 capsule by mouth daily.    [provider]  methenamine (HIPREX) 1 G tablet Take 0.5 g by mouth 2 (two) times daily with a meal.    [provider]  methenamine (MANDELAMINE) 0.5 GM tablet Take 500 mg by mouth 2 (two) times daily.    [provider]  Multiple Vitamins-Minerals (MULTIVITAMIN WITH MINERALS) tablet Take 1 tablet by mouth every morning.     [provider]  Omega-3 Fatty Acids (FISH OIL PO) Take 1 capsule by mouth daily.     [provider]  Probiotic Product (PROBIOTIC DAILY PO) Take 1 capsule by mouth daily.     [provider]  Resveratrol 100 MG CAPS Take 100 mg by mouth daily.    [provider]  white petrolatum (VASELINE) GEL Apply 1 application topically See admin instructions. Applies to both eye lids before bed for prevention    [provider]  Zinc 50 MG CAPS Take 50 mg by mouth every evening.    [provider]    Physical Exam: Vitals:   11/24/22 2315 11/24/22 2330 11/25/22 0030 11/25/22 0100  BP: (!) 158/75 (!) 159/94 (!) 172/76 (!) 147/71  Pulse: 70 70 80 74  Resp: '17 17 19 17  '$ Temp:      TempSrc:      SpO2: 96% 95% 97% 96%   Constitutional: NAD, calm, comfortable, thin this oriented elderly male laying flat in bed Eyes: lids and conjunctivae normal ENMT: Mucous membranes are moist.  Neck: normal,  supple Respiratory: clear to auscultation bilaterally, no wheezing, no crackles. Normal respiratory effort. No accessory muscle use.  Cardiovascular: Regular rate and rhythm, no murmurs / rubs / gallops. No extremity edema.  Abdomen: no tenderness,  Bowel sounds positive.  Musculoskeletal: no clubbing / cyanosis. No joint deformity upper and lower extremities. Good ROM, no contractures. Normal muscle tone.  Skin: no rashes, lesions, ulcers. No induration Neurologic: CN 2-12 grossly intact.  Strength 5/5 in all 4.  Left-sided facial droop.Alert and orientated to self, place but not time.  Unable to recall recent history. Psychiatric:  Normal mood. Data Reviewed:  See HPI  Assessment and Plan: * CVA (cerebral vascular accident) [facility]) -present with left  sided weakness and left facial droop -MRI showed acute right pons infarct.   - obtain CTA head and neck - obtain echocardiogram  - start daily aspirin and Plavix per neurology  -continue Zetia -Obtain A1c and lipids -PT/OT/SLT -Frequent neuro checks and keep on telemetry -Allow for permissive hypertension with blood pressure treatment as needed only if systolic goes above 876  Acute urinary retention -developed urinary retention in ED. Has hx of prostate cancer s/p radical prostatectomy, bladder neck contracture and required self-catherization in the past but Son Trevor Phillips) believes he has not done so for many years.  -had several unsuccessful attempts by RN so urology Dr. Gloriann Loan will place catheter -will also obtain UA.   Elevated BP without diagnosis of hypertension -unclear exact timing of stroke as he may have had symptoms 2 days ago. Allowing for permissive HTN per neurology.  AKI (acute kidney injury) ([place]) -creatinine elevated at 1.32. No recent prior for comparison. Will give small bolus fluid overnight and evaluate in the morning.      Advance Care Planning:   Code Status: Full Code -son Trevor Phillips will verify with other family  members tomorrow  Consults: urology  Family Communication: Trevor Phillips- can continue to contact as he is more available than his brother, Trevor Phillips. Pt spouse has passed away.   Severity of Illness: The appropriate patient status for this patient is OBSERVATION. Observation status is judged to be reasonable and necessary in order to provide the required intensity of service to ensure the patient's safety. The patient's presenting symptoms, physical exam findings, and initial radiographic and laboratory data in the context of their medical condition is felt to place them at decreased risk for further clinical deterioration. Furthermore, it is anticipated that the patient will be medically stable for discharge from the hospital within 2 midnights of admission.   Author: Orene Desanctis, DO 11/25/2022 2:30 AM  For on call review [email or web address].

## 2022-11-24 NOTE — Assessment & Plan Note (Signed)
-  creatinine elevated at 1.32. No recent prior for comparison. Will give small bolus fluid overnight and evaluate in the morning.

## 2022-11-24 NOTE — ED Notes (Signed)
Patient transported to MRI 

## 2022-11-24 NOTE — Assessment & Plan Note (Addendum)
-  developed urinary retention in ED. Has hx of prostate cancer s/p radical prostatectomy, bladder neck contracture and required self-catherization in the past but Son Louie Casa) believes he has not done so for many years.  -had several unsuccessful attempts by RN so urology Dr. Gloriann Loan will place catheter -will also obtain UA.

## 2022-11-24 NOTE — ED Triage Notes (Signed)
Pt BIB [facility] from [facility] for being found down from an unwitnessed fall. No injury, does have some dementia was noticed to have Lt sided weakness & very emotional which was out of character for him. 192/100, 106 CBG, 70 bpm, 16 resp, denies pain.

## 2022-11-24 NOTE — ED Provider Triage Note (Signed)
Emergency Medicine Provider Triage Evaluation Note  Trevor Phillips , a 89 y.o. male  was evaluated in triage.  Patient presenting from a facility.  He tells me that he is presenting from a hotel room.  Said that he was sitting down on the floor and landing on the wall and that he is totally fine.  Says that he did not fall.  Denies any symptoms at this time.   Per EMS patient was found down in his living facility.  He is usually walking and very friendly but he would not get off of the ground.  They said that yesterday he also was not acting like himself. Review of Systems  Positive:  Negative:   Physical Exam  There were no vitals taken for this visit. Gen:   Awake, no distress   Resp:  Normal effort  MSK:   Moves extremities without difficulty  Other:  Speaking in complete sentences.  No bruising or signs of trauma.  No skin tears.  No facial droop.  Moving all extremities and following commands.  Medical Decision Making  Medically screening exam initiated at 3:11 PM.  Appropriate orders placed.  Trevor Phillips was informed that the remainder of the evaluation will be completed by another provider, this initial triage assessment does not replace that evaluation, and the importance of remaining in the ED until their evaluation is complete.     Trevor Phillips, [place] 11/28/22 1549

## 2022-11-24 NOTE — Consult Note (Signed)
NEUROLOGY CONSULTATION NOTE   Date of service: November 24, 2022 Patient Name: Trevor Phillips MRN:  [id number] DOB:  28-May-1931 Reason for consult: "R pontine stroke" Requesting Provider: Orene Desanctis, DO _ _ _   _ __   _ __ _ _  __ __   _ __   __ _  History of Present Illness  Trevor Phillips is a 89 y.o. male with PMH significant for dementia, remote prostrate cancer, who presents with fall and left sided weakness.  Patient unable to provide meaningful history secondary to dementia. Endorses that his son brought him in. Does recall that he fell.  He was noted to be weaker on the left side and MRI brain demonstrated a right pontine stroke.  LKW: unclear mRS: 3 tNKASE: not offered, unknown LKW Thrombectomy: not offered, low suspicion for LVO. NIHSS components Score: Comment  1a Level of Conscious 0'[x]'$  1'[]'$  2'[]'$  3'[]'$      1b LOC Questions 0'[]'$  1'[]'$  2'[x]'$       1c LOC Commands 0'[x]'$  1'[]'$  2'[]'$       2 Best Gaze 0'[x]'$  1'[]'$  2'[]'$       3 Visual 0'[x]'$  1'[]'$  2'[]'$  3'[]'$      4 Facial Palsy 0'[]'$  1'[x]'$  2'[]'$  3'[]'$      5a Motor Arm - left 0'[x]'$  1'[]'$  2'[]'$  3'[]'$  4'[]'$  UN'[]'$    5b Motor Arm - Right 0'[x]'$  1'[]'$  2'[]'$  3'[]'$  4'[]'$  UN'[]'$    6a Motor Leg - Left 0'[]'$  1'[x]'$  2'[]'$  3'[]'$  4'[]'$  UN'[]'$    6b Motor Leg - Right 0'[x]'$  1'[]'$  2'[]'$  3'[]'$  4'[]'$  UN'[]'$    7 Limb Ataxia 0'[]'$  1'[]'$  2'[x]'$  3'[]'$  UN'[]'$     8 Sensory 0'[]'$  1'[x]'$  2'[]'$  UN'[]'$      9 Best Language 0'[x]'$  1'[]'$  2'[]'$  3'[]'$      10 Dysarthria 0'[x]'$  1'[]'$  2'[]'$  UN'[]'$      11 Extinct. and Inattention 0'[x]'$  1'[]'$  2'[]'$       TOTAL: 7       ROS   Constitutional Denies weight loss, fever and chills.   HEENT Denies changes in vision and hearing.   Respiratory Denies SOB and cough.   CV Denies palpitations and CP   GI Denies abdominal pain, nausea, vomiting and diarrhea.   GU + dysuria and urinary frequency.   MSK Denies myalgia and joint pain.   Skin Denies rash and pruritus.   Neurological Denies headache and syncope.   Psychiatric Denies recent changes in mood. Denies anxiety and depression.    Past History   Past Medical History:   Diagnosis Date   Anemia due to acute blood loss    gross hematuria   Bladder neck contracture    chronic   Diverticulosis of colon    Foley catheter in place    placed 07-11-2016   Gross hematuria    History of cerebral infarction    per CT 05-18-2012  chronic lacunar infarcts in basal ganglia  (silent?,  pt denies history S&S)   History of colon polyps    2004   History of diverticulitis    2014   History of prostate cancer 2003   s/p  radical prostatectomy 03-21-2002--  no recurrence (last PSA 07/2016  0.01)   History of recurrent UTIs    History of urinary retention    Neurogenic bladder    since prostatectomy 2003   Self-catheterizes urinary bladder    SECONDARY TO NEUROGENIC BLADDER    Urinary retention    Wears glasses    Past Surgical History:  Procedure Laterality Date   CATARACT  EXTRACTION W/ INTRAOCULAR LENS  IMPLANT, BILATERAL  2010   CYSTO/  DILATATION BLADDER NECK CONTRACTURE  06/02/2002   CYSTOSCOPY W/ RETROGRADES  07/21/2016   Procedure: CYSTOSCOPY WITH RETROGRADE PYELOGRAM, CLOT EVACUATION, BLADDER BIOPSY WITH FULGERATION;  Surgeon: Festus Aloe, MD;  Location: [facility];  Service: Urology;;  0.5 TO 2 CM    EYE SURGERY Right yrs ago   PROSTATE SURGERY  yrs ago   [place] /  REPAIR RECTAL PERFORATION  03-21-2002  dr Jori Moll davis   TRANSURETHRAL RESECTION OF PROSTATE  09/06/2001   History reviewed. No pertinent family history. Social History   Socioeconomic History   Marital status: Married    Spouse name: Not on file   Number of children: Not on file   Years of education: Not on file   Highest education level: Not on file  Occupational History   Not on file  Tobacco Use   Smoking status: Former    Packs/day: 1.00    Years: 8.00    Total pack years: 8.00    Types: Cigarettes    Quit date: 07/16/1957    Years since quitting: 65.4   Smokeless tobacco: Former    Types: Snuff    Quit date: 07/17/1979   Substance and Sexual Activity   Alcohol use: Yes    Comment: "very little"   Drug use: No   Sexual activity: Not on file  Other Topics Concern   Not on file  Social History Narrative   Not on file   Social Determinants of Health   Financial Resource Strain: Not on file  Food Insecurity: Not on file  Transportation Needs: Not on file  Physical Activity: Not on file  Stress: Not on file  Social Connections: Not on file   No Known Allergies  Medications  (Not in a hospital admission)    Vitals   Vitals:   11/24/22 1917 11/24/22 1930 11/24/22 2000 11/24/22 2313  BP:  (!) 178/92 (!) 187/83   Pulse:  73 74   Resp:  16 16   Temp: 98.2 F (36.8 C)   97.9 F (36.6 C)  TempSrc: Oral   Oral  SpO2:  98% 100%      There is no height or weight on file to calculate BMI.  Physical Exam   General: Laying comfortably in bed; in no acute distress.  HENT: Normal oropharynx and mucosa. Normal external appearance of ears and nose.  Neck: Supple, no pain or tenderness  CV: No JVD. No peripheral edema.  Pulmonary: Symmetric Chest rise. Normal respiratory effort.  Abdomen: Soft to touch, non-tender.  Ext: No cyanosis, edema, or deformity  Skin: No rash. Normal palpation of skin.   Musculoskeletal: Normal digits and nails by inspection. No clubbing.   Neurologic Examination  Mental status/Cognition: Alert, oriented to self, place, but not to month and year, good attention.  Speech/language: Fluent, comprehension intact, object naming intact, repetition intact.  Cranial nerves:   CN II Pupils equal and reactive to light, no VF deficits    CN III,IV,VI EOM intact, no gaze preference or deviation, no nystagmus    CN V normal sensation in V1, V2, and V3 segments bilaterally    CN VII Mild left facial droop.   CN VIII normal hearing to speech    CN IX & X normal palatal elevation, no uvular deviation    CN XI 5/5 head turn and 5/5 shoulder shrug bilaterally    CN XII midline tongue  protrusion    Motor:  Muscle bulk: Poor, tone normal, pronator drift none tremor none Mvmt Root Nerve  Muscle Right Left Comments  SA C5/6 Ax Deltoid 5 5   EF C5/6 Mc Biceps 5 5   EE C6/7/8 Rad Triceps 5 5   WF C6/7 Med FCR     WE C7/8 PIN ECU     F Ab C8/T1 U ADM/FDI 5 4+   HF L1/2/3 Fem Illopsoas 5 4   KE L2/3/4 Fem Quad 5 5   DF L4/5 D Peron Tib Ant 5 4+   PF S1/2 Tibial Grc/Sol 5 5    Sensation:  Light touch Decreased mildly in left lower face.   Pin prick    Temperature    Vibration   Proprioception    Coordination/Complex Motor:  - Finger to Nose with ataxia in LUE - Heel to shin intact BL - Rapid alternating movement are slowed on the left. - Gait: Deferred for patient's safety given advanced age, stroke and left-sided weakness Labs   CBC:  Recent Labs  Lab 11/24/22 1535  WBC 12.9*  NEUTROABS 11.1*  HGB 14.6  HCT 43.2  MCV 95.8  PLT 096    Basic Metabolic Panel:  Lab Results  Component Value Date   NA 138 11/24/2022   K 4.3 11/24/2022   CO2 23 11/24/2022   GLUCOSE 98 11/24/2022   BUN 29 (H) 11/24/2022   CREATININE 1.32 (H) 11/24/2022   CALCIUM 9.6 11/24/2022   GFRNONAA 52 (L) 11/24/2022   GFRAA >60 10/24/2019   Lipid Panel: No results found for: "LDLCALC" HgbA1c:  Lab Results  Component Value Date   HGBA1C 5.2 11/24/2022   Urine Drug Screen: No results found for: "LABOPIA", "COCAINSCRNUR", "LABBENZ", "AMPHETMU", "THCU", "LABBARB"  Alcohol Level No results found for: "ETH"  CT Head without contrast(Personally reviewed): CTH was negative for a large hypodensity concerning for a large territory infarct or hyperdensity concerning for an ICH  CT angio Head and Neck with contrast: pending  MRI Brain(Personally reviewed): Acute R pontine stroke Impression   Trevor Phillips is a 89 y.o. male with PMH significant for ementia, remote prostrate cancer, who presents with fall and left sided weakness.  He was found to have acute right pontine stroke.   Last known well was unclear and not a candidate for TNKase or thrombectomy. His neurologic examination is notable for mild left hand grip weakness with significant left upper extremity ataxia, left lower extremity weakness and left facial numbness.  Primary Diagnosis:  Other cerebral infarction due to occlusion of stenosis of small artery.   Recommendations  - Frequent Neuro checks per stroke unit protocol - Recommend Vascular imaging with CT angio head and neck - Recommend obtaining TTE  - Recommend obtaining Lipid panel with LDL - Please start statin if LDL > 70 - Recommend HbA1c - Antithrombotic -aspirin 81 mg daily along with Plavix 75 mg daily for 21 days, followed by aspirin 81 mg daily alone. - Recommend DVT ppx - SBP goal - permissive hypertension first 24 h < 220/110. Held home meds.  - Recommend Telemetry monitoring for arrythmia - Recommend bedside swallow screen prior to PO intake. - Stroke education booklet - Recommend PT/OT/SLP consult  ______________________________________________________________________   Thank you for the opportunity to take part in the care of this patient. If you have any further questions, please contact the neurology consultation attending.  Signed,  [place] Pager Number [phone] _ _ _   _  __   _ __ _ _  __ __   _ __   __ _

## 2022-11-24 NOTE — ED Notes (Signed)
Stood pt in attempt to urinate. Unable to urinate.

## 2022-11-24 NOTE — Assessment & Plan Note (Addendum)
-  unclear exact timing of stroke as he may have had symptoms 2 days ago. Allowing for permissive HTN per neurology.

## 2022-11-24 NOTE — ED Notes (Signed)
Foley attempted by 2 RN's. Unable to pass prostate. MD Schlossman made aware.

## 2022-11-25 ENCOUNTER — Observation Stay (HOSPITAL_COMMUNITY): Payer: Medicare PPO

## 2022-11-25 DIAGNOSIS — Z8546 Personal history of malignant neoplasm of prostate: Secondary | ICD-10-CM | POA: Diagnosis not present

## 2022-11-25 DIAGNOSIS — I63011 Cerebral infarction due to thrombosis of right vertebral artery: Secondary | ICD-10-CM | POA: Diagnosis not present

## 2022-11-25 DIAGNOSIS — I6381 Other cerebral infarction due to occlusion or stenosis of small artery: Secondary | ICD-10-CM | POA: Diagnosis present

## 2022-11-25 DIAGNOSIS — I6389 Other cerebral infarction: Secondary | ICD-10-CM

## 2022-11-25 DIAGNOSIS — R29709 NIHSS score 9: Secondary | ICD-10-CM | POA: Diagnosis not present

## 2022-11-25 DIAGNOSIS — F05 Delirium due to known physiological condition: Secondary | ICD-10-CM | POA: Diagnosis present

## 2022-11-25 DIAGNOSIS — Z8673 Personal history of transient ischemic attack (TIA), and cerebral infarction without residual deficits: Secondary | ICD-10-CM | POA: Diagnosis not present

## 2022-11-25 DIAGNOSIS — N320 Bladder-neck obstruction: Secondary | ICD-10-CM | POA: Diagnosis present

## 2022-11-25 DIAGNOSIS — I6329 Cerebral infarction due to unspecified occlusion or stenosis of other precerebral arteries: Secondary | ICD-10-CM | POA: Diagnosis present

## 2022-11-25 DIAGNOSIS — G8194 Hemiplegia, unspecified affecting left nondominant side: Secondary | ICD-10-CM | POA: Diagnosis present

## 2022-11-25 DIAGNOSIS — R1310 Dysphagia, unspecified: Secondary | ICD-10-CM | POA: Diagnosis present

## 2022-11-25 DIAGNOSIS — Z87440 Personal history of urinary (tract) infections: Secondary | ICD-10-CM | POA: Diagnosis not present

## 2022-11-25 DIAGNOSIS — Z9079 Acquired absence of other genital organ(s): Secondary | ICD-10-CM | POA: Diagnosis not present

## 2022-11-25 DIAGNOSIS — Y92099 Unspecified place in other non-institutional residence as the place of occurrence of the external cause: Secondary | ICD-10-CM | POA: Diagnosis not present

## 2022-11-25 DIAGNOSIS — Z86010 Personal history of colonic polyps: Secondary | ICD-10-CM | POA: Diagnosis not present

## 2022-11-25 DIAGNOSIS — F0390 Unspecified dementia without behavioral disturbance: Secondary | ICD-10-CM | POA: Diagnosis present

## 2022-11-25 DIAGNOSIS — R29707 NIHSS score 7: Secondary | ICD-10-CM | POA: Diagnosis not present

## 2022-11-25 DIAGNOSIS — M4854XA Collapsed vertebra, not elsewhere classified, thoracic region, initial encounter for fracture: Secondary | ICD-10-CM | POA: Diagnosis present

## 2022-11-25 DIAGNOSIS — N179 Acute kidney failure, unspecified: Secondary | ICD-10-CM | POA: Diagnosis present

## 2022-11-25 DIAGNOSIS — Z961 Presence of intraocular lens: Secondary | ICD-10-CM | POA: Diagnosis present

## 2022-11-25 DIAGNOSIS — Z87891 Personal history of nicotine dependence: Secondary | ICD-10-CM | POA: Diagnosis not present

## 2022-11-25 DIAGNOSIS — I341 Nonrheumatic mitral (valve) prolapse: Secondary | ICD-10-CM | POA: Diagnosis present

## 2022-11-25 DIAGNOSIS — N319 Neuromuscular dysfunction of bladder, unspecified: Secondary | ICD-10-CM | POA: Diagnosis present

## 2022-11-25 DIAGNOSIS — R29705 NIHSS score 5: Secondary | ICD-10-CM | POA: Diagnosis not present

## 2022-11-25 DIAGNOSIS — R29810 Facial weakness: Secondary | ICD-10-CM | POA: Diagnosis present

## 2022-11-25 DIAGNOSIS — R29706 NIHSS score 6: Secondary | ICD-10-CM | POA: Diagnosis present

## 2022-11-25 DIAGNOSIS — I6300 Cerebral infarction due to thrombosis of unspecified precerebral artery: Secondary | ICD-10-CM | POA: Diagnosis not present

## 2022-11-25 DIAGNOSIS — E860 Dehydration: Secondary | ICD-10-CM | POA: Diagnosis present

## 2022-11-25 DIAGNOSIS — W19XXXA Unspecified fall, initial encounter: Secondary | ICD-10-CM | POA: Diagnosis present

## 2022-11-25 LAB — ECHOCARDIOGRAM COMPLETE
Area-P 1/2: 3.07 cm2
Est EF: >75
S' Lateral: 2.2 cm

## 2022-11-25 LAB — LIPID PANEL
Cholesterol: 174 mg/dL (ref 0–200)
HDL: 43 mg/dL (ref >40)
LDL Cholesterol: 123 mg/dL — ABNORMAL HIGH (ref 0–99)
Total CHOL/HDL Ratio: 4 RATIO
Triglycerides: 42 mg/dL (ref <150)
VLDL: 8 mg/dL (ref 0–40)

## 2022-11-25 MED ORDER — CLOPIDOGREL BISULFATE 75 MG PO TABS
75.0000 mg | ORAL_TABLET | Freq: Every day | ORAL | Status: DC
  Administered 2022-11-25 – 2022-12-01 (×7): 75 mg via ORAL
  Filled 2022-11-25 (×7): qty 1

## 2022-11-25 MED ORDER — FOOD THICKENER (SIMPLYTHICK)
10.0000 | ORAL | Status: DC | PRN
  Filled 2022-11-25: qty 10

## 2022-11-25 MED ORDER — CLOPIDOGREL BISULFATE 75 MG PO TABS: 75.0000 mg | ORAL_TABLET | Freq: Every day | ORAL | Status: DC

## 2022-11-25 MED ORDER — ASPIRIN 300 MG RE SUPP
300.0000 mg | Freq: Every day | RECTAL | Status: DC
  Administered 2022-11-25: 300 mg via RECTAL
  Filled 2022-11-25: qty 1

## 2022-11-25 MED ORDER — ASPIRIN 81 MG PO CHEW
81.0000 mg | CHEWABLE_TABLET | Freq: Every day | ORAL | Status: DC
  Administered 2022-11-25 – 2022-12-01 (×7): 81 mg via ORAL
  Filled 2022-11-25 (×7): qty 1

## 2022-11-25 MED ORDER — METHENAMINE MANDELATE 0.5 G PO TABS
500.0000 mg | ORAL_TABLET | Freq: Two times a day (BID) | ORAL | Status: DC
  Administered 2022-11-25 – 2022-12-01 (×12): 500 mg via ORAL
  Filled 2022-11-25 (×17): qty 1

## 2022-11-25 MED ORDER — ATORVASTATIN CALCIUM 40 MG PO TABS
40.0000 mg | ORAL_TABLET | Freq: Every day | ORAL | Status: DC
  Administered 2022-11-25 – 2022-12-01 (×7): 40 mg via ORAL
  Filled 2022-11-25 (×7): qty 1

## 2022-11-25 MED ORDER — HALOPERIDOL LACTATE 5 MG/ML IJ SOLN
2.0000 mg | Freq: Four times a day (QID) | INTRAMUSCULAR | Status: DC | PRN
  Administered 2022-11-25: 2 mg via INTRAVENOUS
  Filled 2022-11-25: qty 1

## 2022-11-25 MED ORDER — LORAZEPAM 2 MG/ML IJ SOLN: 1.0000 mg | Freq: Once | INTRAMUSCULAR | Status: DC

## 2022-11-25 MED ORDER — EZETIMIBE 10 MG PO TABS
10.0000 mg | ORAL_TABLET | Freq: Every day | ORAL | Status: DC
  Administered 2022-11-25 – 2022-12-01 (×7): 10 mg via ORAL
  Filled 2022-11-25 (×7): qty 1

## 2022-11-25 NOTE — Evaluation (Signed)
Physical Therapy Evaluation Patient Details Name: Trevor Phillips MRN: [id number] DOB: Dec 25, 1930 Today's Date: 11/25/2022  History of Present Illness  Pt is an 89 yo male admitted s/p unwitnessed fall in his ALF with L side weakness noted. 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  PMH: previous CVAs. prostate CA with previous long standing foley in past, dementia.  Clinical Impression  Pt presents to PT with deficits in functional mobility, gait, balance, strength, power, endurance, and with chronic cognitive deficits 2/2 dementia. Pt with new onset L weakness, impairing stability when mobilizing at this time. Pt benefits from support of walker and PT to improve balance and reduce falls risk. PT recommends SNF placement at this time in an effort to improve gait quality and balance.       Recommendations for follow up therapy are one component of a multi-disciplinary discharge planning process, led by the attending physician.  Recommendations may be updated based on patient status, additional functional criteria and insurance authorization.  Follow Up Recommendations Skilled nursing-short term rehab (<3 hours/day) Can patient physically be transported by private vehicle: Yes    Assistance Recommended at Discharge Frequent or constant Supervision/Assistance  Patient can return home with the following  A lot of help with walking and/or transfers;A lot of help with bathing/dressing/bathroom;Assistance with cooking/housework;Direct supervision/assist for medications management;Direct supervision/assist for financial management;Assist for transportation;Help with stairs or ramp for entrance    Equipment Recommendations  (defer to post-acute)  Recommendations for Other Services       Functional Status Assessment Patient has had a recent decline in their functional status and demonstrates the ability to make significant improvements in function in a reasonable and predictable amount of time.      Precautions / Restrictions Precautions Precautions: Fall Precaution Comments: signficant memory deficits Restrictions Weight Bearing Restrictions: No      Mobility  Bed Mobility Overal bed mobility: Needs Assistance Bed Mobility: Supine to Sit, Sit to Supine     Supine to sit: HOB elevated, Min assist Sit to supine: Min assist        Transfers Overall transfer level: Needs assistance Equipment used: Rolling walker (2 wheels) Transfers: Sit to/from Stand Sit to Stand: Min assist                Ambulation/Gait Ambulation/Gait assistance: Herbalist (Feet): 4 Feet Assistive device: Rolling walker (2 wheels) Gait Pattern/deviations: Step-to pattern Gait velocity: reduced Gait velocity interpretation: <1.31 ft/sec, indicative of household ambulator   General Gait Details: slowed step-to gait, lateral instability  Stairs            Wheelchair Mobility    Modified Rankin (Stroke Patients Only)       Balance Overall balance assessment: Needs assistance Sitting-balance support: No upper extremity supported, Feet supported Sitting balance-Leahy Scale: Fair     Standing balance support: Bilateral upper extremity supported, Reliant on assistive device for balance Standing balance-Leahy Scale: Poor                               Pertinent Vitals/Pain Pain Assessment Pain Assessment: No/denies pain    Home Living Family/patient expects to be discharged to:: Skilled nursing facility Living Arrangements:  (lives at ALF at baseline, memory care) Available Help at Discharge: Available 24 hours/day (staff) Type of Home: Assisted living Home Access: Level entry       Home Layout: One level   Additional Comments: Pt currently lives in  ALF memory care essentially taking care of his own needs outside of meals and medications. Does not use walker or cane PTA    Prior Function Prior Level of Function : Needs assist              Mobility Comments: ambulates independently ADLs Comments: independent with ADLs, assistance with IADLs now in memory care     Hand Dominance   Dominant Hand: Right    Extremity/Trunk Assessment   Upper Extremity Assessment Upper Extremity Assessment: Defer to OT evaluation LUE Deficits / Details: AROM: WFL Strength:  shoulder 3+/5, biceps/triceps 3+/5, grip 4/5, finger extension 3/5. LUE Sensation: decreased light touch LUE Coordination: decreased fine motor;decreased gross motor    Lower Extremity Assessment Lower Extremity Assessment: LLE deficits/detail LLE Deficits / Details: grossly 4-/5    Cervical / Trunk Assessment Cervical / Trunk Assessment: Normal  Communication   Communication: No difficulties  Cognition Arousal/Alertness: Awake/alert Behavior During Therapy: WFL for tasks assessed/performed Overall Cognitive Status: History of cognitive impairments - at baseline                                 General Comments: very poor memory, disoriented to palce time and situation, reduced awareness of deficits        General Comments General comments (skin integrity, edema, etc.): VSS on RA    Exercises     Assessment/Plan    PT Assessment Patient needs continued PT services  PT Problem List Decreased strength;Decreased activity tolerance;Decreased balance;Decreased mobility;Decreased cognition;Decreased knowledge of use of DME;Decreased safety awareness;Decreased knowledge of precautions       PT Treatment Interventions DME instruction;Gait training;Functional mobility training;Therapeutic activities;Therapeutic exercise;Balance training;Neuromuscular re-education;Cognitive remediation;Patient/family education    PT Goals (Current goals can be found in the Care Plan section)  Acute Rehab PT Goals Patient Stated Goal: to return to ambulation PT Goal Formulation: With patient Time For Goal Achievement: 12/09/22 Potential to Achieve Goals:  Fair    Frequency Min 3X/week     Co-evaluation               AM-PAC PT "6 Clicks" Mobility  Outcome Measure Help needed turning from your back to your side while in a flat bed without using bedrails?: A Little Help needed moving from lying on your back to sitting on the side of a flat bed without using bedrails?: A Little Help needed moving to and from a bed to a chair (including a wheelchair)?: A Little Help needed standing up from a chair using your arms (e.g., wheelchair or bedside chair)?: A Little Help needed to walk in hospital room?: A Lot Help needed climbing 3-5 steps with a railing? : Total 6 Click Score: 15    End of Session   Activity Tolerance: Patient tolerated treatment well Patient left: in bed;with call bell/phone within reach Nurse Communication: Mobility status PT Visit Diagnosis: Other abnormalities of gait and mobility (R26.89);Muscle weakness (generalized) (M62.81)    Time: 9924-2683 PT Time Calculation (min) (ACUTE ONLY): 11 min   Charges:   PT Evaluation $PT Eval Low Complexity: Ansley, PT, DPT Acute Rehabilitation Office [phone]   Zenaida Niece 11/25/2022, 11:00 AM

## 2022-11-25 NOTE — Progress Notes (Addendum)
STROKE TEAM PROGRESS NOTE   INTERVAL HISTORY No family at the bedside. He was trying to get out of bed when we initially arrived. Easily redirected and reoriented. Hemodynamically stable.  MRI shows acute right pontine lacunar infarct.  CT angiogram shows severe distal right ICA, distal right A3 ACA, proximal left V4 and proximal left P2 PCA stenosis.  75% stenosis in proximal ICAs bilaterally.  Moderate stenosis at left vertebral artery origin and severe stenosis at right vertebral artery origin. Vitals:   11/25/22 1300 11/25/22 1530 11/25/22 1545 11/25/22 1549  BP: (!) 166/52 (!) 153/115    Pulse: 85 79 79   Resp: '14 19 19   '$ Temp:    98.2 F (36.8 C)  TempSrc:    Oral  SpO2: 95% 97% 97%    CBC:  Recent Labs  Lab 11/24/22 1535  WBC 12.9*  NEUTROABS 11.1*  HGB 14.6  HCT 43.2  MCV 95.8  PLT 124   Basic Metabolic Panel:  Recent Labs  Lab 11/24/22 1535  NA 138  K 4.3  CL 105  CO2 23  GLUCOSE 98  BUN 29*  CREATININE 1.32*  CALCIUM 9.6   Lipid Panel:  Recent Labs  Lab 11/25/22 0237  CHOL 174  TRIG 42  HDL 43  CHOLHDL 4.0  VLDL 8  LDLCALC 123*   HgbA1c:  Recent Labs  Lab 11/24/22 2150  HGBA1C 5.2   Urine Drug Screen: No results for input(s): "LABOPIA", "COCAINSCRNUR", "LABBENZ", "AMPHETMU", "THCU", "LABBARB" in the last 168 hours.  Alcohol Level No results for input(s): "ETH" in the last 168 hours.  IMAGING past 24 hours ECHOCARDIOGRAM COMPLETE  Result Date: 11/25/2022    ECHOCARDIOGRAM REPORT   Patient Name:   Trevor Phillips Date of Exam: 11/25/2022 Medical Rec #:  [id number]      Height:       65.0 in Accession #:    [id number]     Weight:       132.3 lb Date of Birth:  1931/10/03      BSA:          1.660 m Patient Age:    89 years       BP:           164/79 mmHg Patient Gender: M              HR:           88 bpm. Exam Location:  Inpatient Procedure: 2D Echo, Color Doppler and Cardiac Doppler Indications:    Stroke i63.9  History:        Patient has no prior  history of Echocardiogram examinations.  Sonographer:    Raquel Sarna Senior RDCS Referring Phys: CHING T TU IMPRESSIONS  1. 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  2. Left ventricular ejection fraction, by estimation, is >75%. The left ventricle has hyperdynamic function. The left ventricle has no regional wall motion abnormalities. Left ventricular diastolic parameters are consistent with Grade I diastolic dysfunction (impaired relaxation). Elevated left atrial pressure.  3. Right ventricular systolic function is normal. The right ventricular size is normal.  4. The mitral valve is abnormal. Mild mitral valve regurgitation. No evidence of mitral stenosis. There is moderate holosystolic prolapse of posterior leaflet of the mitral valve.  5. The aortic valve is tricuspid. Aortic valve regurgitation is not visualized. Aortic valve sclerosis/calcification is present, without any evidence of aortic stenosis.  6.  The inferior vena cava is normal in size with greater than 50% respiratory variability, suggesting right atrial pressure of 3 mmHg. FINDINGS  Left Ventricle: Left ventricular ejection fraction, by estimation, is >75%. The left ventricle has hyperdynamic function. The left ventricle has no regional wall motion abnormalities. The left ventricular internal cavity size was normal in size. There is no left ventricular hypertrophy. Left ventricular diastolic parameters are consistent with Grade I diastolic dysfunction (impaired relaxation). Elevated left atrial pressure. Right Ventricle: The right ventricular size is normal. Right ventricular systolic function is normal. Left Atrium: Left atrial size was normal in size. Right Atrium: Right atrial size was normal in size. Pericardium: Trivial pericardial effusion is present. Mitral Valve: The mitral valve is abnormal. There is moderate holosystolic prolapse of posterior leaflet  of the mitral valve. Mild mitral valve regurgitation. No evidence of mitral valve stenosis. Tricuspid Valve: The tricuspid valve is normal in structure. Tricuspid valve regurgitation is trivial. No evidence of tricuspid stenosis. Aortic Valve: The aortic valve is tricuspid. Aortic valve regurgitation is not visualized. Aortic valve sclerosis/calcification is present, without any evidence of aortic stenosis. Pulmonic Valve: The pulmonic valve was normal in structure. Pulmonic valve regurgitation is not visualized. No evidence of pulmonic stenosis. Aorta: The aortic root is normal in size and structure. Venous: The inferior vena cava is normal in size with greater than 50% respiratory variability, suggesting right atrial pressure of 3 mmHg. IAS/Shunts: No atrial level shunt detected by color flow Doppler. 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  LEFT VENTRICLE PLAX 2D LVIDd:         3.60 cm   Diastology LVIDs:         2.20 cm   LV e' medial:    4.03 cm/s LV PW:         1.10 cm   LV E/e' medial:  17.8 LV IVS:        1.00 cm   LV e' lateral:   8.27 cm/s LVOT diam:     2.10 cm   LV E/e' lateral: 8.7 LV SV:         49 LV SV Index:   30 LVOT Area:     3.46 cm  RIGHT VENTRICLE RV S prime:     16.30 cm/s TAPSE (M-mode): 1.8 cm LEFT ATRIUM             Index        RIGHT ATRIUM           Index LA diam:        3.30 cm 1.99 cm/m   RA Area:     10.90 cm LA Vol (A2C):   27.4 ml 16.51 ml/m  RA Volume:   20.00 ml  12.05 ml/m LA Vol (A4C):   23.7 ml 14.28 ml/m LA Biplane Vol: 26.0 ml 15.67 ml/m  AORTIC VALVE LVOT Vmax:   72.80 cm/s LVOT Vmean:  49.600 cm/s LVOT VTI:    0.142 m  AORTA Ao Root diam: 3.10 cm Ao Asc diam:  3.00 cm MITRAL VALVE MV Area (PHT): 3.07 cm    SHUNTS MV Decel Time: 247 msec    Systemic VTI:  0.14 m MV E velocity: 71.60 cm/s  Systemic Diam: 2.10 cm MV A velocity: 90.10 cm/s MV E/A ratio:   0.79 Kirk Ruths MD Electronically signed by Kirk Ruths MD Signature Date/Time: 11/25/2022/12:48:34 PM    Final  CT ANGIO HEAD NECK W WO CM  Result Date: 11/25/2022 CLINICAL DATA:  Acute infarct on MRI EXAM: CT ANGIOGRAPHY HEAD AND NECK TECHNIQUE: Multidetector CT imaging of the head and neck was performed using the standard protocol during bolus administration of intravenous contrast. Multiplanar CT image reconstructions and MIPs were obtained to evaluate the vascular anatomy. Carotid stenosis measurements (when applicable) are obtained utilizing NASCET criteria, using the distal internal carotid diameter as the denominator. RADIATION DOSE REDUCTION: This exam was performed according to the departmental dose-optimization program which includes automated exposure control, adjustment of the mA and/or kV according to patient size and/or use of iterative reconstruction technique. CONTRAST:  43m OMNIPAQUE IOHEXOL 350 MG/ML SOLN COMPARISON:  No prior CTA head neck; correlation is made with CT head 11/24/2022 and MRI head 11/24/2022 FINDINGS: CT HEAD FINDINGS For noncontrast findings, please see 11/24/2022 CT head. CTA NECK FINDINGS Aortic arch: Standard branching. Imaged portion shows no evidence of aneurysm or dissection. No significant stenosis of the major arch vessel origins. Aortic atherosclerosis. Right carotid system: Severe stenosis in the distal right common carotid, at the bifurcation, in the proximal ECA, and in the proximal ICA, with up to 75% stenosis in the proximal ICA. No evidence of dissection or occlusion. Left carotid system: 75% stenosis in the proximal left ICA, with moderate stenosis at the bifurcation and in the proximal ECA. No evidence of dissection or occlusion. Vertebral arteries: Moderate stenosis at the origin of the left vertebral artery, with severe stenosis in the proximal left P1. Moderate noncalcified stenosis in the left V2 (series 8, image 200). The left vertebral artery  is otherwise patent to the skull base, with severe stenosis at the V3-V4 junction. Severe stenosis of the origin of the right vertebral artery, which is otherwise patent to the skull base without significant stenosis. Skeleton: No acute osseous abnormality. Degenerative changes in the cervical spine. Other neck: 5 mm hypoenhancing nodule in the right thyroid lobe, for which no follow-up is currently indicated. (Reference: J Am Coll Radiol. 2015 Feb;12(2): 143-50) Upper chest: No focal pulmonary opacity or pleural effusion. Review of the MIP images confirms the above findings CTA HEAD FINDINGS Anterior circulation: Both internal carotid arteries are patent to the termini, with severe stenosis in the right ICA, near the terminus (series 8, images 107 and 104), and mild stenosis in the bilateral proximal supraclinoid ICA bilaterally. A1 segments patent, with multifocal mild stenosis (series 8, image 100 and 101). Anterior cerebral arteries are patent to their distal aspects with multifocal irregularity and more focal severe stenosis in the distal right A3 (series 8, image 83) and mild stenosis in the proximal A2 bilaterally (series 8, images 94 and 97). No M1 stenosis or occlusion. Mild stenosis in the proximal right M2 (series 8, image 115). MCA branches otherwise perfused and largely symmetric. Posterior circulation: Vertebral arteries patent to the vertebrobasilar junction, with severe stenosis in the proximal left V4 (series 8, image 166 and 157) and moderate to severe stenosis in the mid right V4 (series 8, image 152). Posterior inferior cerebellar arteries patent proximally. Basilar patent to its distal aspect. Superior cerebellar arteries patent proximally, with mild stenosis in the proximal left SCA (series 8, image 100). Patent P1 segments, with mild stenosis at the left P1 origin (series 8, image 98) and in the mid right P1 (series 8, image 94). Severe stenosis in the proximal left P2 (series 8, image 105)  and moderate stenosis in the proximal right P2 (series 8, image 102). Additional mild  and moderate multifocal stenosis in the bilateral P2 segments (series 8, image 107). The bilateral posterior communicating arteries are not visualized. Venous sinuses: As permitted by contrast timing, patent. Anatomic variants: None significant. Review of the MIP images confirms the above findings IMPRESSION: 1. No intracranial large vessel occlusion. 2. Severe stenosis in the distal right ICA, distal right A3, proximal left V4 and in the proximal left P2. Additional moderate to severe stenosis in the mid right V4. Milder stenosis as described above. 3. Approximately 75% stenosis in the proximal ICA bilaterally. 4. Moderate stenosis at the origin of the left vertebral artery and severe stenosis at the origin of the right vertebral artery, with additional moderate stenosis in the left V2 and severe stenosis at the left V3-V4 junction. 5. Aortic atherosclerosis. Aortic Atherosclerosis (ICD10-I70.0). Electronically Signed   By: Merilyn Baba M.D.   On: 11/25/2022 01:16   MR BRAIN WO CONTRAST  Result Date: 11/24/2022 CLINICAL DATA:  Found down, unwitnessed fall, left-sided weakness EXAM: MRI HEAD WITHOUT CONTRAST TECHNIQUE: Multiplanar, multiecho pulse sequences of the brain and surrounding structures were obtained without intravenous contrast. COMPARISON:  12/09/2016 FINDINGS: Brain: Restricted diffusion with ADC correlate in the anterior right pons (series 2, image 19), which measures up to 11 mm in greatest dimension. This is associated with minimally increased T2 hyperintense signal. No acute hemorrhage, mass, mass effect, or midline shift. No hemosiderin deposition to suggest remote hemorrhage. No hydrocephalus or extra-axial collection. Remote lacunar infarcts in the left basal ganglia. T2 hyperintense signal in the periventricular white matter, likely the sequela of chronic small vessel ischemic disease. Vascular: Normal  arterial flow voids. Skull and upper cervical spine: Normal marrow signal. Sinuses/Orbits: Clear paranasal sinuses. Status post bilateral lens replacements. Other: The mastoids are well aerated. IMPRESSION: Acute infarct in the anterior right pons. These results were called by telephone at the time of interpretation on 11/24/2022 at 7:15 pm to provider [facility] , who verbally acknowledged these results. Electronically Signed   By: Merilyn Baba M.D.   On: 11/24/2022 19:14   CT Head Wo Contrast  Result Date: 11/24/2022 CLINICAL DATA:  Neck trauma, unwitnessed fall, left-sided weakness EXAM: CT HEAD WITHOUT CONTRAST CT CERVICAL SPINE WITHOUT CONTRAST TECHNIQUE: Multidetector CT imaging of the head and cervical spine was performed following the standard protocol without intravenous contrast. Multiplanar CT image reconstructions of the cervical spine were also generated. RADIATION DOSE REDUCTION: This exam was performed according to the departmental dose-optimization program which includes automated exposure control, adjustment of the mA and/or kV according to patient size and/or use of iterative reconstruction technique. COMPARISON:  CT scan 05/19/2012 FINDINGS: CT HEAD FINDINGS Brain: Chronic torcula adipose tissue, clinically irrelevant in unchanged from the prior exam. Remote lacunar infarcts in the basal ganglia and left internal capsule not substantially changed from prior. Suspected small left upper thalamic lacunar infarct, image 19 series 3. Otherwise, the brainstem, cerebellum, cerebral peduncles, thalamus, basal ganglia, basilar cisterns, and ventricular system appear within normal limits. No intracranial hemorrhage, mass lesion, or acute CVA. Vascular: There is atherosclerotic calcification of the cavernous carotid arteries bilaterally. Skull: Unremarkable Sinuses/Orbits: Mild chronic right frontal sinusitis. Mild right mastoid effusion. Other: No supplemental non-categorized findings. CT CERVICAL  SPINE FINDINGS Alignment: Stable grade 1 degenerative retrolisthesis at C3-4. Skull base and vertebrae: Degenerative findings at the anterior C1-2 articulation. Remote appearing 30% compression fracture at T3, likely previously with middle column involvement, I do not see a well-defined fracture planes so this is probably chronic. Probably late subacute  superior endplate compression fracture at T2 with about 10% loss of height. No cervical spine fracture. Soft tissues and spinal canal: Branch vessel and aortic arch atherosclerotic vascular calcification. Bilateral common carotid artery atherosclerosis. Disc levels: Mild osseous foraminal stenosis bilaterally at C3-4 due to facet and uncinate spurring. Upper chest: Biapical pleuroparenchymal scarring. Other: No supplemental non-categorized findings. IMPRESSION: 1. No acute intracranial findings or acute cervical spine findings. 2. Probably late subacute superior endplate compression fracture at T2 with about 10% loss of height. 3. Remote 30% compression fracture at T3, likely previously with middle column involvement. 4. Remote lacunar infarcts in the basal ganglia and left internal capsule. 5. Mild chronic right frontal sinusitis. 6. Mild right mastoid effusion. 7. Mild osseous foraminal stenosis bilaterally at C3-4 due to facet and uncinate spurring. 8. Atherosclerosis. Aortic Atherosclerosis (ICD10-I70.0). Electronically Signed   By: Van Clines M.D.   On: 11/24/2022 16:33   CT Cervical Spine Wo Contrast  Result Date: 11/24/2022 CLINICAL DATA:  Neck trauma, unwitnessed fall, left-sided weakness EXAM: CT HEAD WITHOUT CONTRAST CT CERVICAL SPINE WITHOUT CONTRAST TECHNIQUE: Multidetector CT imaging of the head and cervical spine was performed following the standard protocol without intravenous contrast. Multiplanar CT image reconstructions of the cervical spine were also generated. RADIATION DOSE REDUCTION: This exam was performed according to the  departmental dose-optimization program which includes automated exposure control, adjustment of the mA and/or kV according to patient size and/or use of iterative reconstruction technique. COMPARISON:  CT scan 05/19/2012 FINDINGS: CT HEAD FINDINGS Brain: Chronic torcula adipose tissue, clinically irrelevant in unchanged from the prior exam. Remote lacunar infarcts in the basal ganglia and left internal capsule not substantially changed from prior. Suspected small left upper thalamic lacunar infarct, image 19 series 3. Otherwise, the brainstem, cerebellum, cerebral peduncles, thalamus, basal ganglia, basilar cisterns, and ventricular system appear within normal limits. No intracranial hemorrhage, mass lesion, or acute CVA. Vascular: There is atherosclerotic calcification of the cavernous carotid arteries bilaterally. Skull: Unremarkable Sinuses/Orbits: Mild chronic right frontal sinusitis. Mild right mastoid effusion. Other: No supplemental non-categorized findings. CT CERVICAL SPINE FINDINGS Alignment: Stable grade 1 degenerative retrolisthesis at C3-4. Skull base and vertebrae: Degenerative findings at the anterior C1-2 articulation. Remote appearing 30% compression fracture at T3, likely previously with middle column involvement, I do not see a well-defined fracture planes so this is probably chronic. Probably late subacute superior endplate compression fracture at T2 with about 10% loss of height. No cervical spine fracture. Soft tissues and spinal canal: Branch vessel and aortic arch atherosclerotic vascular calcification. Bilateral common carotid artery atherosclerosis. Disc levels: Mild osseous foraminal stenosis bilaterally at C3-4 due to facet and uncinate spurring. Upper chest: Biapical pleuroparenchymal scarring. Other: No supplemental non-categorized findings. IMPRESSION: 1. No acute intracranial findings or acute cervical spine findings. 2. Probably late subacute superior endplate compression fracture at  T2 with about 10% loss of height. 3. Remote 30% compression fracture at T3, likely previously with middle column involvement. 4. Remote lacunar infarcts in the basal ganglia and left internal capsule. 5. Mild chronic right frontal sinusitis. 6. Mild right mastoid effusion. 7. Mild osseous foraminal stenosis bilaterally at C3-4 due to facet and uncinate spurring. 8. Atherosclerosis. Aortic Atherosclerosis (ICD10-I70.0). Electronically Signed   By: Van Clines M.D.   On: 11/24/2022 16:33    PHYSICAL EXAM  Physical Exam  Constitutional: Frail malnourished looking elderly Caucasian male.  He is restless. Cardiovascular: Normal rate and regular rhythm.  Respiratory: Effort normal, non-labored breathing  Neuro: Mental Status: Patient is awake,  alert, oriented to person. Remembers he is in the hospital when prompted. States he is 89 years old. Able to follow mulitstep commands well.  Patient is able to give a clear and coherent history. No signs of aphasia or neglect Cranial Nerves: II: Visual Fields are full. Pupils are equal, round, and reactive to light.   III,IV, VI: EOMI without ptosis or diploplia.  V: Facial sensation is symmetric to temperature VII: Facial movement is symmetric resting and smiling VIII: Hearing is intact to voice X: Phonation normal  XI: Shoulder shrug is symmetric. XII: Tongue protrudes midline without atrophy or fasciculations.  Motor: Tone is normal. Bulk is normal.  RUE 5/5  LUE 4/5 RLE 5/5  LLE 4/5 Sensory: Sensation is symmetric to light touch arms and legs.  Cerebellar: FNF and HKS with ataxia on the left    ASSESSMENT/PLAN Trevor Phillips is a 89 y.o. male with history of dementia, remote prostrate cancer, who presents with fall and left sided weakness. NIH on arrival is a 7.   Stroke:  Right pontine infarct Etiology: Likely due to severe concurrent extracranial and intracranial atherosclerosis Code Stroke CT head No acute intracranial  findings or acute cervical spine findings. Probably late subacute superior endplate compression fracture at T2 with about 10% loss of height. MRI  Acute infarct in the anterior right pons.  MRA  Severe stenosis in the distal right ICA, distal right A3, proximal left V4 and in the proximal left P2. Additional moderate to severe stenosis in the mid right V4. Milder stenosis as described above. 3. Approximately 75% stenosis in the proximal ICA bilaterally. 4. Moderate stenosis at the origin of the left vertebral artery and severe stenosis at the origin of the right vertebral artery, with additional moderate stenosis in the left V2 and severe stenosis at the left V3-V4 junction. 2D Echo EF greater than 75% LDL 123 HgbA1c 5.2 VTE prophylaxis - Lovenox     Diet   DIET DYS 3 Room service appropriate? Yes with Assist; Fluid consistency: Nectar Thick   No antithrombotic prior to admission, now on aspirin 81 mg daily and clopidogrel 75 mg daily for 3 months and then ASA '81mg'$  alone.  Therapy recommendations:   Disposition:    Hypertension Stable Permissive hypertension (OK if < 220/120) but gradually normalize in 5-7 days Long-term BP goal normotensive  Hyperlipidemia Home meds:  zetia, resumed in hospital LDL 123, goal < 70 Add Atorvastatin '40mg'$   Continue statin at discharge  Other Stroke Risk Factors Advanced Age >/= 57  Hx stroke/TIA  Other Active Problems Dementia  Home meds: Aricept   Hospital day # 0  Patient seen and examined by NP/APP with MD. MD to update note as needed.   Janine Ores, DNP, FNP-BC [facility] Pager: [phone]  I have personally obtained history,examined this patient, reviewed notes, independently viewed imaging studies, participated in medical decision making and plan of care.ROS completed by me personally and pertinent positives fully documented  I have made any additions or clarifications directly to the above note. Agree with note above.   Patient has significant baseline dementia presented with left-sided weakness with MRI scan showing right pontine infarct with CT angiogram showing severe extensive bilateral extracranial and intracranial anterior and posterior circulation atherosclerotic disease.  Recommend aspirin and Plavix for 3 months followed by aspirin alone and aggressive risk factor modification.  No family available at the bedside for discussion.  Discussed with Dr. Thereasa Solo.  Greater than 50% time during this 50-minute  visit was spent in counseling and coordination of care about his pontine stroke and discussion about evaluation and treatment and answering questions with the care team  Antony Contras, MD Medical Director [place] Pager: [phone] 11/26/2022 1:24 PM  To contact Stroke Continuity provider, please refer to [email or web address] After hours, contact General Neurology

## 2022-11-25 NOTE — Evaluation (Signed)
Clinical/Bedside Swallow Evaluation Patient Details  Name: Trevor Phillips MRN: [id number] Date of Birth: 1931-01-20  Today's Date: 11/25/2022 Time: SLP Start Time (ACUTE ONLY): 6160 SLP Stop Time (ACUTE ONLY): 1336 SLP Time Calculation (min) (ACUTE ONLY): 14 min  Past Medical History:  Past Medical History:  Diagnosis Date   Anemia due to acute blood loss    gross hematuria   Bladder neck contracture    chronic   Diverticulosis of colon    Foley catheter in place    placed 07-11-2016   Gross hematuria    History of cerebral infarction    per CT 05-18-2012  chronic lacunar infarcts in basal ganglia  (silent?,  pt denies history S&S)   History of colon polyps    2004   History of diverticulitis    2014   History of prostate cancer 2003   s/p  radical prostatectomy 03-21-2002--  no recurrence (last PSA 07/2016  0.01)   History of recurrent UTIs    History of urinary retention    Neurogenic bladder    since prostatectomy 2003   Self-catheterizes urinary bladder    SECONDARY TO NEUROGENIC BLADDER    Urinary retention    Wears glasses    Past Surgical History:  Past Surgical History:  Procedure Laterality Date   CATARACT EXTRACTION W/ INTRAOCULAR LENS  IMPLANT, BILATERAL  2010   CYSTO/  DILATATION BLADDER NECK CONTRACTURE  06/02/2002   CYSTOSCOPY W/ RETROGRADES  07/21/2016   Procedure: CYSTOSCOPY WITH RETROGRADE PYELOGRAM, CLOT EVACUATION, BLADDER BIOPSY WITH FULGERATION;  Surgeon: Festus Aloe, MD;  Location: [place];  Service: Urology;;  0.5 TO 2 CM    EYE SURGERY Right yrs ago   PROSTATE SURGERY  yrs ago   [place] /  REPAIR RECTAL PERFORATION  03-21-2002  dr Jori Moll davis   TRANSURETHRAL RESECTION OF PROSTATE  09/06/2001   HPI:  Pt is an 89 yo male admitted s/p unwitnessed fall in his ALF with L side weakness noted. 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  PMH: previous CVAs. prostate CA with previous long standing foley in past,  dementia.    Assessment / Plan / Recommendation  Clinical Impression  Pt participated in a clinical swallow assessment with his son at bedside. Oral mechanism exam was marked by focal deficits left face and deviation of tongue to left upon extension.  He demonstrated intermittent coughing after consuming thin liquids. He tolerated nectar thick liquids without difficulty.  Mechanical solids and purees were manipulated orally without difficulty and with no oral residue post-swallow. Recommend starting a dysphagia 3 diet with nectar thick liquids; he may benefit from MBS. D/W RN, son.  Diet information was posted at [facility] in the ED. SLP will follow,. SLP Visit Diagnosis: Dysphagia, oropharyngeal phase (R13.12)    Aspiration Risk  Mild aspiration risk    Diet Recommendation   Dysphagia 3/nectar thick liquids  Medication Administration: Whole meds with puree    Other  Recommendations Oral Care Recommendations: Oral care BID Other Recommendations: Order thickener from pharmacy    Recommendations for follow up therapy are one component of a multi-disciplinary discharge planning process, led by the attending physician.  Recommendations may be updated based on patient status, additional functional criteria and insurance authorization.  Follow up Recommendations        Assistance Recommended at Discharge  tba  Functional Status Assessment    Frequency and Duration min 2x/week  2 weeks       Prognosis  Prognosis for Safe Diet Advancement: Good      Swallow Study   General Date of Onset: 11/24/22 HPI: Pt is an 89 yo male admitted s/p unwitnessed fall in his ALF with L side weakness noted. 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  PMH: previous CVAs. prostate CA with previous long standing foley in past, dementia. Type of Study: Bedside Swallow Evaluation Previous Swallow Assessment: no Diet Prior to this Study: NPO Temperature Spikes Noted: No Respiratory Status: Room air History of Recent  Intubation: No Behavior/Cognition: Alert;Cooperative;Confused Oral Cavity Assessment: Dried secretions Oral Care Completed by SLP: Yes Oral Cavity - Dentition: Adequate natural dentition Vision: Functional for self-feeding Self-Feeding Abilities: Needs assist Patient Positioning: Upright in bed Baseline Vocal Quality: Normal Volitional Cough: Strong Volitional Swallow: Able to elicit    Oral/Motor/Sensory Function Overall Oral Motor/Sensory Function: Mild impairment Facial Symmetry: Abnormal symmetry left;Suspected CN VII (facial) dysfunction Lingual Symmetry: Abnormal symmetry left;Suspected CN XII (hypoglossal) dysfunction   Ice Chips Ice chips: Within functional limits   Thin Liquid Thin Liquid: Impaired Pharyngeal  Phase Impairments: Cough - Immediate    Nectar Thick Nectar Thick Liquid: Within functional limits   Honey Thick Honey Thick Liquid: Not tested   Puree Puree: Within functional limits   Solid     Solid: Within functional limits      [name] Laurice 11/25/2022,1:47 PM  Estill Bamberg L. Tivis Ringer, MA CCC/SLP Clinical Specialist - [place] Office number [phone]

## 2022-11-25 NOTE — Evaluation (Signed)
Occupational Therapy Evaluation Patient Details Name: Trevor Phillips MRN: [id number] DOB: September 06, 1931 Today's Date: 11/25/2022   History of Present Illness Pt is an 89 yo male admitted s/p unwitnessed fall in his ALF with L side weakness noted. 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  PMH: previous CVAs. prostate CA with previous long standing foley in past, dementia.   Clinical Impression   Pt admitted with the above diagnosis and has the deficits listed below.Pt would benefit from cont OT to increase functional use of LUE during all adls and independence with adls back to a functional level so pt can d/c back to ALF at some point. Pt on memory unit and was previously independent with all basic adls but requires assist with meds and has significant dementia.  Pt would benefit from SNF for rehab to attempt to reach this level of independence unless his ALF can provide his current level of care then [facility] in the ALF might be an option.       Recommendations for follow up therapy are one component of a multi-disciplinary discharge planning process, led by the attending physician.  Recommendations may be updated based on patient status, additional functional criteria and insurance authorization.   Follow Up Recommendations  Skilled nursing-short term rehab (<3 hours/day)     Assistance Recommended at Discharge Frequent or constant Supervision/Assistance  Patient can return home with the following A little help with walking and/or transfers;A little help with bathing/dressing/bathroom;Assistance with cooking/housework;Direct supervision/assist for medications management;Direct supervision/assist for financial management;Assist for transportation;Help with stairs or ramp for entrance    Functional Status Assessment  Patient has had a recent decline in their functional status and demonstrates the ability to make significant improvements in function in a reasonable and predictable amount of time.  Equipment  Recommendations  Other (comment) (tbd)    Recommendations for Other Services       Precautions / Restrictions Precautions Precautions: Fall Precaution Comments: signficant memory deficits Restrictions Weight Bearing Restrictions: No      Mobility Bed Mobility Overal bed mobility: Needs Assistance Bed Mobility: Supine to Sit, Sit to Supine     Supine to sit: Min assist, HOB elevated Sit to supine: Mod assist   General bed mobility comments: assist to move hips back onto gurney and assist to lift L leg fully back onto bed.    Transfers Overall transfer level: Needs assistance Equipment used: Rolling walker (2 wheels) Transfers: Sit to/from Stand, Bed to chair/wheelchair/BSC Sit to Stand: Min assist     Step pivot transfers: Mod assist     General transfer comment: L hand falls off of walker at times. Little awareness by pt of L leg weakness so drags L leg along when moving.      Balance Overall balance assessment: Needs assistance Sitting-balance support: Feet supported Sitting balance-Leahy Scale: Fair     Standing balance support: Bilateral upper extremity supported, During functional activity, Reliant on assistive device for balance Standing balance-Leahy Scale: Poor Standing balance comment: must have outside assist to stand.                           ADL either performed or assessed with clinical judgement   ADL Overall ADL's : Needs assistance/impaired Eating/Feeding:  (failed swallowing eval) Eating/Feeding Details (indicate cue type and reason): not tested due to failed swallowing eval Grooming: Wash/dry hands;Wash/dry face;Oral care;Minimal assistance;Sitting Grooming Details (indicate cue type and reason): Pt required cues to open toothpaste due  to weak grip in L hand.  Pt instructed to hold toothpaste in L hand and open with R hand. Upper Body Bathing: Minimal assistance;Sitting   Lower Body Bathing: Maximal assistance;Sit to/from  stand;Cueing for compensatory techniques Lower Body Bathing Details (indicate cue type and reason): Pt with L side weakness and unable to stand without walker and assist. Upper Body Dressing : Minimal assistance;Sitting   Lower Body Dressing: Maximal assistance;Sit to/from stand;Cueing for compensatory techniques Lower Body Dressing Details (indicate cue type and reason): assist to get started with L leg and to stand and manage clothing due to poor balance Toilet Transfer: Moderate assistance;Stand-pivot;Rolling walker (2 wheels) Toilet Transfer Details (indicate cue type and reason): assist with LLE Toileting- Clothing Manipulation and Hygiene: Sit to/from stand;Maximal assistance;Cueing for compensatory techniques Toileting - Clothing Manipulation Details (indicate cue type and reason): Pt stood with min assist while therapist managed clothing     Functional mobility during ADLs: Moderate assistance;Rolling walker (2 wheels) General ADL Comments: Pt limited due to R side weakness and poor insight/cognition.     Vision Baseline Vision/History: 0 No visual deficits Ability to See in Adequate Light: 0 Adequate Patient Visual Report: No change from baseline Vision Assessment?: Yes Eye Alignment: Within Functional Limits Ocular Range of Motion: Within Functional Limits Alignment/Gaze Preference: Gaze right Tracking/Visual Pursuits: Able to track stimulus in all quads without difficulty Convergence: Within functional limits Visual Fields: No apparent deficits     Agricultural engineer Tested?: Yes Perception Deficits: Inattention/neglect Inattention/Neglect:  (attends to L side with a delay)   Praxis      Pertinent Vitals/Pain Pain Assessment Pain Assessment: No/denies pain     Hand Dominance Right   Extremity/Trunk Assessment Upper Extremity Assessment Upper Extremity Assessment: LUE deficits/detail LUE Deficits / Details: AROM: WFL Strength:  shoulder 3+/5,  biceps/triceps 3+/5, grip 4/5, finger extension 3/5. LUE Sensation: decreased light touch LUE Coordination: decreased fine motor;decreased gross motor   Lower Extremity Assessment Lower Extremity Assessment: Defer to PT evaluation   Cervical / Trunk Assessment Cervical / Trunk Assessment: Normal   Communication Communication Communication: Other (comment) (slighly dysarthric speech)   Cognition Arousal/Alertness: Awake/alert Behavior During Therapy: WFL for tasks assessed/performed Overall Cognitive Status: History of cognitive impairments - at baseline                                 General Comments: Spoke with son.  Sounds like pt most likely is at baseline for cognition. Pt not normally oriented and has significant memory deficits.     General Comments  Pt most limited by L side weakness, slight L neglect and dementia.    Exercises     Shoulder Instructions      Home Living Family/patient expects to be discharged to:: Skilled nursing facility                                 Additional Comments: Pt currently lives in ALF memory care essentially taking care of his own needs outside of meals and medications. Does not use walker or cane PTA      Prior Functioning/Environment Prior Level of Function : Independent/Modified Independent;History of Falls (last six months)             Mobility Comments: never has used walker or cane. Falls rarely per son. ADLs Comments: Pt can do all own adls. Stopped driving  one month ago.        OT Problem List: Decreased strength;Impaired balance (sitting and/or standing);Impaired vision/perception;Decreased coordination;Decreased cognition;Decreased safety awareness;Decreased knowledge of use of DME or AE;Decreased knowledge of precautions;Impaired sensation;Impaired UE functional use      OT Treatment/Interventions: Self-care/ADL training;DME and/or AE instruction;Therapeutic activities;Balance training     OT Goals(Current goals can be found in the care plan section) Acute Rehab OT Goals Patient Stated Goal: none stated OT Goal Formulation: With family Time For Goal Achievement: 12/09/22 Potential to Achieve Goals: Fair ADL Goals Pt Will Perform Eating: with supervision;sitting Pt Will Perform Grooming: with supervision;standing Pt Will Perform Upper Body Dressing: with supervision;sitting Pt Will Perform Lower Body Dressing: with min guard assist;sit to/from stand Pt Will Transfer to Toilet: with min assist;ambulating;bedside commode;regular height toilet Pt Will Perform Toileting - Clothing Manipulation and hygiene: with supervision;sitting/lateral leans Additional ADL Goal #1: Pt will use LUE as assist during all adls without cues.  OT Frequency: Min 2X/week    Co-evaluation              AM-PAC OT "6 Clicks" Daily Activity     Outcome Measure Help from another person eating meals?: Total Help from another person taking care of personal grooming?: A Little Help from another person toileting, which includes using toliet, bedpan, or urinal?: A Lot Help from another person bathing (including washing, rinsing, drying)?: A Lot Help from another person to put on and taking off regular upper body clothing?: A Little Help from another person to put on and taking off regular lower body clothing?: A Lot 6 Click Score: 13   End of Session Equipment Utilized During Treatment: Rolling walker (2 wheels) Nurse Communication: Mobility status  Activity Tolerance: Patient tolerated treatment well Patient left: in bed;with call bell/phone within reach  OT Visit Diagnosis: Unsteadiness on feet (R26.81);Muscle weakness (generalized) (M62.81);Other abnormalities of gait and mobility (R26.89);Hemiplegia and hemiparesis Hemiplegia - Right/Left: Left Hemiplegia - dominant/non-dominant: Non-Dominant Hemiplegia - caused by: Cerebral infarction                Time: 0810-0830 OT Time Calculation  (min): 20 min Charges:  OT General Charges $OT Visit: 1 Visit OT Evaluation $OT Eval Moderate Complexity: 1 Mod  Glenford Peers 11/25/2022, 9:09 AM

## 2022-11-25 NOTE — ED Notes (Signed)
Upon assessment of this patient, patient has removed self from monitor and continues to pull at foley catheter. Patient repositioned in bed and R wrist restraint reapplied. Patient provided with mashed potatoes and thickened liquids to drink. Patient fed by this RN. Patient now resting comfortably.

## 2022-11-25 NOTE — ED Notes (Signed)
Pt transported to [facility].  ?

## 2022-11-25 NOTE — ED Notes (Signed)
Speech Therapist at bedside

## 2022-11-25 NOTE — ED Notes (Signed)
Report received from RN. Per [facility] pt was attempted to pull foley catheter and has blood around tubing. Foley was placed by urology. Pt has mitts on hands but due to confusion continues to pull on iv, foley and cardiac monitor leads. Pt is redirected . PT is alert to Self and place.

## 2022-11-25 NOTE — ED Notes (Signed)
Medication given with Speech Therapist in the rooom. ST was performing a swallow screen and pt was able to tolerate. Medication given with apple sauce.

## 2022-11-25 NOTE — Progress Notes (Signed)
Echocardiogram 2D Echocardiogram has been performed.  Oneal Deputy Yailin Biederman RDCS 11/25/2022, 10:06 AM

## 2022-11-25 NOTE — ED Notes (Signed)
Pt continues to have mits on. Pt attempts to get mits off and is redirected. Assisted patient back into the bed. Covers put back on pt.

## 2022-11-25 NOTE — Progress Notes (Signed)
Trevor Phillips  XFG:[id number] DOB: Feb 20, 1931 DOA: 11/24/2022 PCP: Lajean Manes, MD    Brief Narrative:  89 year old ALF resident with a history of dementia, prostate cancer status post radical, bladder neck contracture, diverticular disease, urinary retention, and recurrent UTI who was found down following an unwitnessed fall with left-sided weakness being appreciated afterwards.  In the ED CT head and cervical spine was negative for acute findings but MRI revealed an acute right pons CVA.  Consultants:  Urology Neurology  Goals of Care:  Code Status: Full Code   DVT prophylaxis: Lovenox  Interim Hx: Afebrile.  Vital signs stable.  Alert and conversant but mildly confused.  No new complaints.  Denies chest pain or shortness of breath.  Assessment & Plan:  Acute CVA R pons  -Resultant left-sided weakness and facial droop -Revealed on MRI of brain -CTa head and neck reveals no LVO but severe stenosis in the distal right ICA, distal right A3, proximal left V4, and proximal left P2 with 75% stenosis in the proximal ICA bilaterally as well as disease of the left vertebral artery and right vertebral artery -TTE notes mitral valve prolapse which appears to be mild with a EF 75% and no WMA with no obvious intracardiac source of embolization and no intracardiac shunt -Daily ASA '81mg'$  plus Plavix for 21 days then aspirin 81 mg alone per Neurology -LDL 123 - continue Zetia -add Lipitor  -A1c 5.2 -PT/OT/SLP -suggestion is for SNF rehab -Allow for permissive hypertension  Dysphagia Patient failed his initial stroke swallow screen -SLP has cleared him for dysphagia 3 diet with nectar thick liquids  Acute urinary retention Encountered during ED stay -history of prostate cancer status post radical prostatectomy with bladder neck contracture -Foley catheter placed by urology and is to remain in place for 1 week -follow-up in the urology clinic  Acute kidney injury Creatinine 1.32 at  presentation -likely related to simple dehydration and possible urinary retention -recheck in a.m.  Dementia At baseline is reportedly oriented to person and place but not time - lives in ALF   Family Communication: Spoke with son at bedside Disposition: Will need SNF for rehab stay with patient and son agreeing to this   Objective: Blood pressure (!) 164/79, pulse 66, temperature 98.3 F (36.8 C), temperature source Oral, resp. rate 17, SpO2 96 %. No intake or output data in the 24 hours ending 11/25/22 0758 There were no vitals filed for this visit.  Examination: General: No acute respiratory distress Lungs: Clear to auscultation bilaterally without wheezes or crackles Cardiovascular: Regular rate and rhythm without murmur gallop or rub normal S1 and S2 Abdomen: Nontender, nondistended, soft, bowel sounds positive, no rebound, no ascites, no appreciable mass Extremities: No significant cyanosis, clubbing, or edema bilateral lower extremities  CBC: Recent Labs  Lab 11/24/22 1535  WBC 12.9*  NEUTROABS 11.1*  HGB 14.6  HCT 43.2  MCV 95.8  PLT 967   Basic Metabolic Panel: Recent Labs  Lab 11/24/22 1535  NA 138  K 4.3  CL 105  CO2 23  GLUCOSE 98  BUN 29*  CREATININE 1.32*  CALCIUM 9.6   GFR: CrCl cannot be calculated (Unknown ideal weight.).   Scheduled Meds:   stroke: early stages of recovery book   Does not apply Once   aspirin  81 mg Oral Daily   Or   aspirin  300 mg Rectal Daily   clopidogrel  75 mg Oral Daily   enoxaparin (LOVENOX) injection  40 mg Subcutaneous Q24H  sodium chloride flush  3 mL Intravenous Once      LOS: 0 days   Cherene Altes, MD [facility] Office  [phone] Pager - Text Page per Amion  If 7PM-7AM, please contact night-coverage per Amion 11/25/2022, 7:58 AM

## 2022-11-26 DIAGNOSIS — I6300 Cerebral infarction due to thrombosis of unspecified precerebral artery: Secondary | ICD-10-CM

## 2022-11-26 DIAGNOSIS — I63011 Cerebral infarction due to thrombosis of right vertebral artery: Secondary | ICD-10-CM | POA: Diagnosis not present

## 2022-11-26 LAB — BASIC METABOLIC PANEL
Anion gap: 13 (ref 5–15)
BUN: 24 mg/dL — ABNORMAL HIGH (ref 8–23)
CO2: 18 mmol/L — ABNORMAL LOW (ref 22–32)
Calcium: 9.3 mg/dL (ref 8.9–10.3)
Chloride: 107 mmol/L (ref 98–111)
Creatinine, Ser: 1.24 mg/dL (ref 0.61–1.24)
GFR, Estimated: 56 mL/min — ABNORMAL LOW (ref >60)
Glucose, Bld: 95 mg/dL (ref 70–99)
Potassium: 3.7 mmol/L (ref 3.5–5.1)
Sodium: 138 mmol/L (ref 135–145)

## 2022-11-26 LAB — CBC
HCT: 42.5 % (ref 39.0–52.0)
Hemoglobin: 14.3 g/dL (ref 13.0–17.0)
MCH: 32.4 pg (ref 26.0–34.0)
MCHC: 33.6 g/dL (ref 30.0–36.0)
MCV: 96.2 fL (ref 80.0–100.0)
Platelets: 202 10*3/uL (ref 150–400)
RBC: 4.42 MIL/uL (ref 4.22–5.81)
RDW: 12.9 % (ref 11.5–15.5)
WBC: 11.3 10*3/uL — ABNORMAL HIGH (ref 4.0–10.5)
nRBC: 0 % (ref 0.0–0.2)

## 2022-11-26 MED ORDER — QUETIAPINE FUMARATE 25 MG PO TABS
25.0000 mg | ORAL_TABLET | Freq: Every day | ORAL | Status: DC
  Administered 2022-11-26 – 2022-11-30 (×5): 25 mg via ORAL
  Filled 2022-11-26 (×5): qty 1

## 2022-11-26 MED ORDER — CHLORHEXIDINE GLUCONATE CLOTH 2 % EX PADS
6.0000 | MEDICATED_PAD | Freq: Every day | CUTANEOUS | Status: DC
  Administered 2022-11-26 – 2022-11-30 (×5): 6 via TOPICAL

## 2022-11-26 MED ORDER — DONEPEZIL HCL 10 MG PO TABS
10.0000 mg | ORAL_TABLET | Freq: Every day | ORAL | Status: DC
  Administered 2022-11-26 – 2022-11-30 (×5): 10 mg via ORAL
  Filled 2022-11-26 (×5): qty 1

## 2022-11-26 NOTE — ED Notes (Signed)
ED TO INPATIENT HANDOFF REPORT  ED Nurse Name and Phone #: Dodger Sinning, RN [phone]  S Name/Age/Gender Trevor Phillips 89 y.o. male Room/Bed: 006C/006C  Code Status   Code Status: Full Code  Home/SNF/Other Skilled nursing facility Patient oriented to: self and place Is this baseline? No   Triage Complete: Triage complete  Chief Complaint CVA (cerebral vascular accident) [facility]) [I63.9]  Triage Note Pt BIB [facility] from [facility] for being found down from an unwitnessed fall. No injury, does have some dementia was noticed to have Lt sided weakness & very emotional which was out of character for him. 192/100, 106 CBG, 70 bpm, 16 resp, denies pain.    Allergies No Known Allergies  Level of Care/Admitting Diagnosis ED Disposition     ED Disposition  Admit   Condition  --   Comment  Hospital Area: [place] [[id number]]  Level of Care: Telemetry Medical [[id number]]  May admit patient to [facility] or [facility] if equivalent level of care is available:: No  Covid Evaluation: Asymptomatic - no recent exposure (last 10 days) testing not required  Diagnosis: CVA (cerebral vascular accident) [facility]) [[id number]]  Admitting Physician: Cherene Altes [[id number]]  Attending Physician: Thereasa Solo, JEFFREY T [[id number]]  Certification:: I certify this patient will need inpatient services for at least 2 midnights          B Medical/Surgery History Past Medical History:  Diagnosis Date   Anemia due to acute blood loss    gross hematuria   Bladder neck contracture    chronic   Diverticulosis of colon    Foley catheter in place    placed 07-11-2016   Gross hematuria    History of cerebral infarction    per CT 05-18-2012  chronic lacunar infarcts in basal ganglia  (silent?,  pt denies history S&S)   History of colon polyps    2004   History of diverticulitis    2014   History of prostate cancer 2003   s/p  radical prostatectomy 03-21-2002--  no recurrence (last PSA 07/2016  0.01)    History of recurrent UTIs    History of urinary retention    Neurogenic bladder    since prostatectomy 2003   Self-catheterizes urinary bladder    SECONDARY TO NEUROGENIC BLADDER    Urinary retention    Wears glasses    Past Surgical History:  Procedure Laterality Date   CATARACT EXTRACTION W/ INTRAOCULAR LENS  IMPLANT, BILATERAL  2010   CYSTO/  DILATATION BLADDER NECK CONTRACTURE  06/02/2002   CYSTOSCOPY W/ RETROGRADES  07/21/2016   Procedure: CYSTOSCOPY WITH RETROGRADE PYELOGRAM, CLOT EVACUATION, BLADDER BIOPSY WITH FULGERATION;  Surgeon: Festus Aloe, MD;  Location: [facility];  Service: Urology;;  0.5 TO 2 CM    EYE SURGERY Right yrs ago   PROSTATE SURGERY  yrs ago   [place] /  REPAIR RECTAL PERFORATION  03-21-2002  dr Jori Moll davis   TRANSURETHRAL RESECTION OF PROSTATE  09/06/2001     A IV Location/Drains/Wounds Patient Lines/Drains/Airways Status     Active Line/Drains/Airways     Name Placement date Placement time Site Days   Peripheral IV 11/26/22 20 G Right;Upper Arm 11/26/22  0513  Arm  less than 1   Urethral Catheter Dr. Gloriann Loan 12 Fr. 11/24/22  2230  --  2   Incision (Closed) 07/21/16 Penis Other (Comment) 07/21/16  0720  -- 2319            Intake/Output  Last 24 hours  Intake/Output Summary (Last 24 hours) at 11/26/2022 1442 Last data filed at 11/26/2022 6073 Gross per 24 hour  Intake --  Output 850 ml  Net -850 ml    Labs/Imaging Results for orders placed or performed during the hospital encounter of 11/24/22 (from the past 48 hour(s))  CBC with Differential     Status: Abnormal   Collection Time: 11/24/22  3:35 PM  Result Value Ref Range   WBC 12.9 (H) 4.0 - 10.5 K/uL   RBC 4.51 4.22 - 5.81 MIL/uL   Hemoglobin 14.6 13.0 - 17.0 g/dL   HCT 43.2 39.0 - 52.0 %   MCV 95.8 80.0 - 100.0 fL   MCH 32.4 26.0 - 34.0 pg   MCHC 33.8 30.0 - 36.0 g/dL   RDW 12.8 11.5 - 15.5 %   Platelets 212 150 - 400 K/uL   nRBC 0.0  0.0 - 0.2 %   Neutrophils Relative % 85 %   Neutro Abs 11.1 (H) 1.7 - 7.7 K/uL   Lymphocytes Relative 8 %   Lymphs Abs 1.0 0.7 - 4.0 K/uL   Monocytes Relative 5 %   Monocytes Absolute 0.6 0.1 - 1.0 K/uL   Eosinophils Relative 0 %   Eosinophils Absolute 0.0 0.0 - 0.5 K/uL   Basophils Relative 1 %   Basophils Absolute 0.1 0.0 - 0.1 K/uL   Immature Granulocytes 1 %   Abs Immature Granulocytes 0.12 (H) 0.00 - 0.07 K/uL    Comment: Performed at [facility], [address], [place], [place] [address]  Comprehensive metabolic panel     Status: Abnormal   Collection Time: 11/24/22  3:35 PM  Result Value Ref Range   Sodium 138 135 - 145 mmol/L   Potassium 4.3 3.5 - 5.1 mmol/L   Chloride 105 98 - 111 mmol/L   CO2 23 22 - 32 mmol/L   Glucose, Bld 98 70 - 99 mg/dL    Comment: Glucose reference range applies only to samples taken after fasting for at least 8 hours.   BUN 29 (H) 8 - 23 mg/dL   Creatinine, Ser 1.32 (H) 0.61 - 1.24 mg/dL   Calcium 9.6 8.9 - 10.3 mg/dL   Total Protein 6.9 6.5 - 8.1 g/dL   Albumin 4.1 3.5 - 5.0 g/dL   AST 21 15 - 41 U/L   ALT 20 0 - 44 U/L   Alkaline Phosphatase 54 38 - 126 U/L   Total Bilirubin 2.0 (H) 0.3 - 1.2 mg/dL   GFR, Estimated 52 (L) >60 mL/min    Comment: (NOTE) Calculated using the CKD-EPI Creatinine Equation (2021)    Anion gap 10 5 - 15    Comment: Performed at [facility] [address], [place], [place] [address]  Troponin I (High Sensitivity)     Status: None   Collection Time: 11/24/22  3:35 PM  Result Value Ref Range   Troponin I (High Sensitivity) 11 <18 ng/L    Comment: (NOTE) Elevated high sensitivity troponin I (hsTnI) values and significant  changes across serial measurements may suggest ACS but many other  chronic and acute conditions are known to elevate hsTnI results.  Refer to the "Links" section for chest pain algorithms and additional  guidance. Performed at [facility], [place] [address],  [place], [place] [address]   Protime-INR     Status: Abnormal   Collection Time: 11/24/22  3:35 PM  Result Value Ref Range   Prothrombin  Time 15.6 (H) 11.4 - 15.2 seconds   INR 1.3 (H) 0.8 - 1.2    Comment: (NOTE) INR goal varies based on device and disease states. Performed at [facility], [address], [place], [place] [address]   APTT     Status: None   Collection Time: 11/24/22  3:35 PM  Result Value Ref Range   aPTT 29 24 - 36 seconds    Comment: Performed at [facility] [address] [place], [place] [address]  POC CBG, ED     Status: Abnormal   Collection Time: 11/24/22  4:50 PM  Result Value Ref Range   Glucose-Capillary 103 (H) 70 - 99 mg/dL    Comment: Glucose reference range applies only to samples taken after fasting for at least 8 hours.  Troponin I (High Sensitivity)     Status: None   Collection Time: 11/24/22  7:08 PM  Result Value Ref Range   Troponin I (High Sensitivity) 14 <18 ng/L    Comment: (NOTE) Elevated high sensitivity troponin I (hsTnI) values and significant  changes across serial measurements may suggest ACS but many other  chronic and acute conditions are known to elevate hsTnI results.  Refer to the "Links" section for chest pain algorithms and additional  guidance. Performed at [facility], [place] [address] [place], [place] [address]   Hemoglobin A1c     Status: None   Collection Time: 11/24/22  9:50 PM  Result Value Ref Range   Hgb A1c MFr Bld 5.2 4.8 - 5.6 %    Comment: (NOTE) Pre diabetes:          5.7%-6.4%  Diabetes:              >6.4%  Glycemic control for   <7.0% adults with diabetes    Mean Plasma Glucose 102.54 mg/dL    Comment: Performed at [facility] [address], [place], [place] [address]  Urinalysis, Routine w reflex microscopic Urine, Catheterized     Status: Abnormal   Collection Time: 11/24/22 11:08 PM  Result Value Ref Range   Color, Urine YELLOW YELLOW   APPearance CLEAR CLEAR    Specific Gravity, Urine 1.018 1.005 - 1.030   pH 5.0 5.0 - 8.0   Glucose, UA NEGATIVE NEGATIVE mg/dL   Hgb urine dipstick LARGE (A) NEGATIVE   Bilirubin Urine NEGATIVE NEGATIVE   Ketones, ur 20 (A) NEGATIVE mg/dL   Protein, ur NEGATIVE NEGATIVE mg/dL   Nitrite NEGATIVE NEGATIVE   Leukocytes,Ua NEGATIVE NEGATIVE   RBC / HPF >50 (H) 0 - 5 RBC/hpf   WBC, UA 0-5 0 - 5 WBC/hpf   Bacteria, UA RARE (A) NONE SEEN   Squamous Epithelial / HPF 0-5 0 - 5 /HPF   Mucus PRESENT    Hyaline Casts, UA PRESENT     Comment: Performed at [facility] [address] [place],  [address]  Lipid panel     Status: Abnormal   Collection Time: 11/25/22  2:37 AM  Result Value Ref Range   Cholesterol 174 0 - 200 mg/dL   Triglycerides 42 <150 mg/dL   HDL 43 >40 mg/dL   Total CHOL/HDL Ratio 4.0 RATIO   VLDL 8 0 - 40 mg/dL   LDL Cholesterol 123 (H) 0 - 99 mg/dL    Comment:        Total Cholesterol/HDL:CHD Risk Coronary Heart Disease Risk Table  Men   Women  1/2 Average Risk   3.4   3.3  Average Risk       5.0   4.4  2 X Average Risk   9.6   7.1  3 X Average Risk  23.4   11.0        Use the calculated Patient Ratio above and the CHD Risk Table to determine the patient's CHD Risk.        ATP III CLASSIFICATION (LDL):  <100     mg/dL   Optimal  100-129  mg/dL   Near or Above                    Optimal  130-159  mg/dL   Borderline  160-189  mg/dL   High  >190     mg/dL   Very High Performed at [facility] [address], [place], [place] [address]   Basic metabolic panel     Status: Abnormal   Collection Time: 11/26/22  5:04 AM  Result Value Ref Range   Sodium 138 135 - 145 mmol/L   Potassium 3.7 3.5 - 5.1 mmol/L   Chloride 107 98 - 111 mmol/L   CO2 18 (L) 22 - 32 mmol/L   Glucose, Bld 95 70 - 99 mg/dL    Comment: Glucose reference range applies only to samples taken after fasting for at least 8 hours.   BUN 24 (H) 8 - 23 mg/dL   Creatinine, Ser 1.24 0.61  - 1.24 mg/dL   Calcium 9.3 8.9 - 10.3 mg/dL   GFR, Estimated 56 (L) >60 mL/min    Comment: (NOTE) Calculated using the CKD-EPI Creatinine Equation (2021)    Anion gap 13 5 - 15    Comment: Performed at [facility] [address] [place], [place] [address]  CBC     Status: Abnormal   Collection Time: 11/26/22  5:04 AM  Result Value Ref Range   WBC 11.3 (H) 4.0 - 10.5 K/uL   RBC 4.42 4.22 - 5.81 MIL/uL   Hemoglobin 14.3 13.0 - 17.0 g/dL   HCT 42.5 39.0 - 52.0 %   MCV 96.2 80.0 - 100.0 fL   MCH 32.4 26.0 - 34.0 pg   MCHC 33.6 30.0 - 36.0 g/dL   RDW 12.9 11.5 - 15.5 %   Platelets 202 150 - 400 K/uL   nRBC 0.0 0.0 - 0.2 %    Comment: Performed at [facility], [place] [address] [place], [place] [address]   ECHOCARDIOGRAM COMPLETE  Result Date: 11/25/2022    ECHOCARDIOGRAM REPORT   Patient Name:   BINGHAM MILLETTE Date of Exam: 11/25/2022 Medical Rec #:  [id number]      Height:       65.0 in Accession #:    [id number]     Weight:       132.3 lb Date of Birth:  [birth year]      BSA:          1.660 m Patient Age:    39 years       BP:           164/79 mmHg Patient Gender: M              HR:           88 bpm. Exam Location:  Inpatient Procedure: 2D Echo, Color Doppler and Cardiac Doppler Indications:    Stroke i63.9  History:  Patient has no prior history of Echocardiogram examinations.  Sonographer:    Raquel Sarna Senior RDCS Referring Phys: CHING T TU IMPRESSIONS  1. 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  2. Left ventricular ejection fraction, by estimation, is >75%. The left ventricle has hyperdynamic function. The left ventricle has no regional wall motion abnormalities. Left ventricular diastolic parameters are consistent with Grade I diastolic dysfunction (impaired relaxation). Elevated left atrial pressure.  3. Right ventricular systolic function is normal. The right ventricular size is  normal.  4. The mitral valve is abnormal. Mild mitral valve regurgitation. No evidence of mitral stenosis. There is moderate holosystolic prolapse of posterior leaflet of the mitral valve.  5. The aortic valve is tricuspid. Aortic valve regurgitation is not visualized. Aortic valve sclerosis/calcification is present, without any evidence of aortic stenosis.  6. The inferior vena cava is normal in size with greater than 50% respiratory variability, suggesting right atrial pressure of 3 mmHg. FINDINGS  Left Ventricle: Left ventricular ejection fraction, by estimation, is >75%. The left ventricle has hyperdynamic function. The left ventricle has no regional wall motion abnormalities. The left ventricular internal cavity size was normal in size. There is no left ventricular hypertrophy. Left ventricular diastolic parameters are consistent with Grade I diastolic dysfunction (impaired relaxation). Elevated left atrial pressure. Right Ventricle: The right ventricular size is normal. Right ventricular systolic function is normal. Left Atrium: Left atrial size was normal in size. Right Atrium: Right atrial size was normal in size. Pericardium: Trivial pericardial effusion is present. Mitral Valve: The mitral valve is abnormal. There is moderate holosystolic prolapse of posterior leaflet of the mitral valve. Mild mitral valve regurgitation. No evidence of mitral valve stenosis. Tricuspid Valve: The tricuspid valve is normal in structure. Tricuspid valve regurgitation is trivial. No evidence of tricuspid stenosis. Aortic Valve: The aortic valve is tricuspid. Aortic valve regurgitation is not visualized. Aortic valve sclerosis/calcification is present, without any evidence of aortic stenosis. Pulmonic Valve: The pulmonic valve was normal in structure. Pulmonic valve regurgitation is not visualized. No evidence of pulmonic stenosis. Aorta: The aortic root is normal in size and structure. Venous: The inferior vena cava is normal  in size with greater than 50% respiratory variability, suggesting right atrial pressure of 3 mmHg. IAS/Shunts: No atrial level shunt detected by color flow Doppler. 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  LEFT VENTRICLE PLAX 2D LVIDd:         3.60 cm   Diastology LVIDs:         2.20 cm   LV e' medial:    4.03 cm/s LV PW:         1.10 cm   LV E/e' medial:  17.8 LV IVS:        1.00 cm   LV e' lateral:   8.27 cm/s LVOT diam:     2.10 cm   LV E/e' lateral: 8.7 LV SV:         49 LV SV Index:   30 LVOT Area:     3.46 cm  RIGHT VENTRICLE RV S prime:     16.30 cm/s TAPSE (M-mode): 1.8 cm LEFT ATRIUM             Index        RIGHT ATRIUM           Index  LA diam:        3.30 cm 1.99 cm/m   RA Area:     10.90 cm LA Vol (A2C):   27.4 ml 16.51 ml/m  RA Volume:   20.00 ml  12.05 ml/m LA Vol (A4C):   23.7 ml 14.28 ml/m LA Biplane Vol: 26.0 ml 15.67 ml/m  AORTIC VALVE LVOT Vmax:   72.80 cm/s LVOT Vmean:  49.600 cm/s LVOT VTI:    0.142 m  AORTA Ao Root diam: 3.10 cm Ao Asc diam:  3.00 cm MITRAL VALVE MV Area (PHT): 3.07 cm    SHUNTS MV Decel Time: 247 msec    Systemic VTI:  0.14 m MV E velocity: 71.60 cm/s  Systemic Diam: 2.10 cm MV A velocity: 90.10 cm/s MV E/A ratio:  0.79 Kirk Ruths MD Electronically signed by Kirk Ruths MD Signature Date/Time: 11/25/2022/12:48:34 PM    Final    CT ANGIO HEAD NECK W WO CM  Result Date: 11/25/2022 CLINICAL DATA:  Acute infarct on MRI EXAM: CT ANGIOGRAPHY HEAD AND NECK TECHNIQUE: Multidetector CT imaging of the head and neck was performed using the standard protocol during bolus administration of intravenous contrast. Multiplanar CT image reconstructions and MIPs were obtained to evaluate the vascular anatomy. Carotid stenosis measurements (when applicable) are obtained utilizing NASCET criteria, using the distal internal carotid diameter as the denominator.  RADIATION DOSE REDUCTION: This exam was performed according to the departmental dose-optimization program which includes automated exposure control, adjustment of the mA and/or kV according to patient size and/or use of iterative reconstruction technique. CONTRAST:  38m OMNIPAQUE IOHEXOL 350 MG/ML SOLN COMPARISON:  No prior CTA head neck; correlation is made with CT head 11/24/2022 and MRI head 11/24/2022 FINDINGS: CT HEAD FINDINGS For noncontrast findings, please see 11/24/2022 CT head. CTA NECK FINDINGS Aortic arch: Standard branching. Imaged portion shows no evidence of aneurysm or dissection. No significant stenosis of the major arch vessel origins. Aortic atherosclerosis. Right carotid system: Severe stenosis in the distal right common carotid, at the bifurcation, in the proximal ECA, and in the proximal ICA, with up to 75% stenosis in the proximal ICA. No evidence of dissection or occlusion. Left carotid system: 75% stenosis in the proximal left ICA, with moderate stenosis at the bifurcation and in the proximal ECA. No evidence of dissection or occlusion. Vertebral arteries: Moderate stenosis at the origin of the left vertebral artery, with severe stenosis in the proximal left P1. Moderate noncalcified stenosis in the left V2 (series 8, image 200). The left vertebral artery is otherwise patent to the skull base, with severe stenosis at the V3-V4 junction. Severe stenosis of the origin of the right vertebral artery, which is otherwise patent to the skull base without significant stenosis. Skeleton: No acute osseous abnormality. Degenerative changes in the cervical spine. Other neck: 5 mm hypoenhancing nodule in the right thyroid lobe, for which no follow-up is currently indicated. (Reference: J Am Coll Radiol. 2015 Feb;12(2): 143-50) Upper chest: No focal pulmonary opacity or pleural effusion. Review of the MIP images confirms the above findings CTA HEAD FINDINGS Anterior circulation: Both internal carotid  arteries are patent to the termini, with severe stenosis in the right ICA, near the terminus (series 8, images 107 and 104), and mild stenosis in the bilateral proximal supraclinoid ICA bilaterally. A1 segments patent, with multifocal mild stenosis (series 8, image 100 and 101). Anterior cerebral arteries are patent to their distal aspects with multifocal irregularity and more focal severe stenosis in the distal right A3 (series  8, image 83) and mild stenosis in the proximal A2 bilaterally (series 8, images 94 and 97). No M1 stenosis or occlusion. Mild stenosis in the proximal right M2 (series 8, image 115). MCA branches otherwise perfused and largely symmetric. Posterior circulation: Vertebral arteries patent to the vertebrobasilar junction, with severe stenosis in the proximal left V4 (series 8, image 166 and 157) and moderate to severe stenosis in the mid right V4 (series 8, image 152). Posterior inferior cerebellar arteries patent proximally. Basilar patent to its distal aspect. Superior cerebellar arteries patent proximally, with mild stenosis in the proximal left SCA (series 8, image 100). Patent P1 segments, with mild stenosis at the left P1 origin (series 8, image 98) and in the mid right P1 (series 8, image 94). Severe stenosis in the proximal left P2 (series 8, image 105) and moderate stenosis in the proximal right P2 (series 8, image 102). Additional mild and moderate multifocal stenosis in the bilateral P2 segments (series 8, image 107). The bilateral posterior communicating arteries are not visualized. Venous sinuses: As permitted by contrast timing, patent. Anatomic variants: None significant. Review of the MIP images confirms the above findings IMPRESSION: 1. No intracranial large vessel occlusion. 2. Severe stenosis in the distal right ICA, distal right A3, proximal left V4 and in the proximal left P2. Additional moderate to severe stenosis in the mid right V4. Milder stenosis as described above. 3.  Approximately 75% stenosis in the proximal ICA bilaterally. 4. Moderate stenosis at the origin of the left vertebral artery and severe stenosis at the origin of the right vertebral artery, with additional moderate stenosis in the left V2 and severe stenosis at the left V3-V4 junction. 5. Aortic atherosclerosis. Aortic Atherosclerosis (ICD10-I70.0). Electronically Signed   By: Merilyn Baba M.D.   On: 11/25/2022 01:16   MR BRAIN WO CONTRAST  Result Date: 11/24/2022 CLINICAL DATA:  Found down, unwitnessed fall, left-sided weakness EXAM: MRI HEAD WITHOUT CONTRAST TECHNIQUE: Multiplanar, multiecho pulse sequences of the brain and surrounding structures were obtained without intravenous contrast. COMPARISON:  12/09/2016 FINDINGS: Brain: Restricted diffusion with ADC correlate in the anterior right pons (series 2, image 19), which measures up to 11 mm in greatest dimension. This is associated with minimally increased T2 hyperintense signal. No acute hemorrhage, mass, mass effect, or midline shift. No hemosiderin deposition to suggest remote hemorrhage. No hydrocephalus or extra-axial collection. Remote lacunar infarcts in the left basal ganglia. T2 hyperintense signal in the periventricular white matter, likely the sequela of chronic small vessel ischemic disease. Vascular: Normal arterial flow voids. Skull and upper cervical spine: Normal marrow signal. Sinuses/Orbits: Clear paranasal sinuses. Status post bilateral lens replacements. Other: The mastoids are well aerated. IMPRESSION: Acute infarct in the anterior right pons. These results were called by telephone at the time of interpretation on 11/24/2022 at 7:15 pm to provider [facility] , who verbally acknowledged these results. Electronically Signed   By: Merilyn Baba M.D.   On: 11/24/2022 19:14   CT Head Wo Contrast  Result Date: 11/24/2022 CLINICAL DATA:  Neck trauma, unwitnessed fall, left-sided weakness EXAM: CT HEAD WITHOUT CONTRAST CT CERVICAL SPINE  WITHOUT CONTRAST TECHNIQUE: Multidetector CT imaging of the head and cervical spine was performed following the standard protocol without intravenous contrast. Multiplanar CT image reconstructions of the cervical spine were also generated. RADIATION DOSE REDUCTION: This exam was performed according to the departmental dose-optimization program which includes automated exposure control, adjustment of the mA and/or kV according to patient size and/or use of iterative reconstruction  technique. COMPARISON:  CT scan 05/19/2012 FINDINGS: CT HEAD FINDINGS Brain: Chronic torcula adipose tissue, clinically irrelevant in unchanged from the prior exam. Remote lacunar infarcts in the basal ganglia and left internal capsule not substantially changed from prior. Suspected small left upper thalamic lacunar infarct, image 19 series 3. Otherwise, the brainstem, cerebellum, cerebral peduncles, thalamus, basal ganglia, basilar cisterns, and ventricular system appear within normal limits. No intracranial hemorrhage, mass lesion, or acute CVA. Vascular: There is atherosclerotic calcification of the cavernous carotid arteries bilaterally. Skull: Unremarkable Sinuses/Orbits: Mild chronic right frontal sinusitis. Mild right mastoid effusion. Other: No supplemental non-categorized findings. CT CERVICAL SPINE FINDINGS Alignment: Stable grade 1 degenerative retrolisthesis at C3-4. Skull base and vertebrae: Degenerative findings at the anterior C1-2 articulation. Remote appearing 30% compression fracture at T3, likely previously with middle column involvement, I do not see a well-defined fracture planes so this is probably chronic. Probably late subacute superior endplate compression fracture at T2 with about 10% loss of height. No cervical spine fracture. Soft tissues and spinal canal: Branch vessel and aortic arch atherosclerotic vascular calcification. Bilateral common carotid artery atherosclerosis. Disc levels: Mild osseous foraminal  stenosis bilaterally at C3-4 due to facet and uncinate spurring. Upper chest: Biapical pleuroparenchymal scarring. Other: No supplemental non-categorized findings. IMPRESSION: 1. No acute intracranial findings or acute cervical spine findings. 2. Probably late subacute superior endplate compression fracture at T2 with about 10% loss of height. 3. Remote 30% compression fracture at T3, likely previously with middle column involvement. 4. Remote lacunar infarcts in the basal ganglia and left internal capsule. 5. Mild chronic right frontal sinusitis. 6. Mild right mastoid effusion. 7. Mild osseous foraminal stenosis bilaterally at C3-4 due to facet and uncinate spurring. 8. Atherosclerosis. Aortic Atherosclerosis (ICD10-I70.0). Electronically Signed   By: Van Clines M.D.   On: 11/24/2022 16:33   CT Cervical Spine Wo Contrast  Result Date: 11/24/2022 CLINICAL DATA:  Neck trauma, unwitnessed fall, left-sided weakness EXAM: CT HEAD WITHOUT CONTRAST CT CERVICAL SPINE WITHOUT CONTRAST TECHNIQUE: Multidetector CT imaging of the head and cervical spine was performed following the standard protocol without intravenous contrast. Multiplanar CT image reconstructions of the cervical spine were also generated. RADIATION DOSE REDUCTION: This exam was performed according to the departmental dose-optimization program which includes automated exposure control, adjustment of the mA and/or kV according to patient size and/or use of iterative reconstruction technique. COMPARISON:  CT scan 05/19/2012 FINDINGS: CT HEAD FINDINGS Brain: Chronic torcula adipose tissue, clinically irrelevant in unchanged from the prior exam. Remote lacunar infarcts in the basal ganglia and left internal capsule not substantially changed from prior. Suspected small left upper thalamic lacunar infarct, image 19 series 3. Otherwise, the brainstem, cerebellum, cerebral peduncles, thalamus, basal ganglia, basilar cisterns, and ventricular system appear  within normal limits. No intracranial hemorrhage, mass lesion, or acute CVA. Vascular: There is atherosclerotic calcification of the cavernous carotid arteries bilaterally. Skull: Unremarkable Sinuses/Orbits: Mild chronic right frontal sinusitis. Mild right mastoid effusion. Other: No supplemental non-categorized findings. CT CERVICAL SPINE FINDINGS Alignment: Stable grade 1 degenerative retrolisthesis at C3-4. Skull base and vertebrae: Degenerative findings at the anterior C1-2 articulation. Remote appearing 30% compression fracture at T3, likely previously with middle column involvement, I do not see a well-defined fracture planes so this is probably chronic. Probably late subacute superior endplate compression fracture at T2 with about 10% loss of height. No cervical spine fracture. Soft tissues and spinal canal: Branch vessel and aortic arch atherosclerotic vascular calcification. Bilateral common carotid artery atherosclerosis. Disc levels: Mild osseous  foraminal stenosis bilaterally at C3-4 due to facet and uncinate spurring. Upper chest: Biapical pleuroparenchymal scarring. Other: No supplemental non-categorized findings. IMPRESSION: 1. No acute intracranial findings or acute cervical spine findings. 2. Probably late subacute superior endplate compression fracture at T2 with about 10% loss of height. 3. Remote 30% compression fracture at T3, likely previously with middle column involvement. 4. Remote lacunar infarcts in the basal ganglia and left internal capsule. 5. Mild chronic right frontal sinusitis. 6. Mild right mastoid effusion. 7. Mild osseous foraminal stenosis bilaterally at C3-4 due to facet and uncinate spurring. 8. Atherosclerosis. Aortic Atherosclerosis (ICD10-I70.0). Electronically Signed   By: Van Clines M.D.   On: 11/24/2022 16:33   DG Chest 2 View  Result Date: 11/24/2022 CLINICAL DATA:  Short of breath, previous tobacco abuse EXAM: CHEST - 2 VIEW COMPARISON:  02/20/2022  FINDINGS: Frontal and lateral views of the chest demonstrate an unremarkable cardiac silhouette. Stable emphysema. No airspace disease, effusion, or pneumothorax. Chronic pleural thickening at the left costophrenic angle. No acute bony abnormalities. IMPRESSION: 1. Stable chest, no acute process. Electronically Signed   By: Randa Ngo M.D.   On: 11/24/2022 16:14    Pending Labs Unresulted Labs (From admission, onward)    None       Vitals/Pain Today's Vitals   11/26/22 0815 11/26/22 1115 11/26/22 1200 11/26/22 1249  BP: (!) 185/79 (!) 186/84 (!) 177/141   Pulse: 75 84 91   Resp: 20 (!) 24 17   Temp:      TempSrc:      SpO2: 100% 99% 99%   PainSc:    0-No pain    Isolation Precautions No active isolations  Medications Medications  sodium chloride flush (NS) 0.9 % injection 3 mL (3 mLs Intravenous Not Given 11/24/22 1647)  acetaminophen (TYLENOL) tablet 650 mg (has no administration in time range)  enoxaparin (LOVENOX) injection 40 mg (40 mg Subcutaneous Given 11/25/22 2228)  aspirin chewable tablet 81 mg (81 mg Oral Given 11/26/22 1017)  atorvastatin (LIPITOR) tablet 40 mg (40 mg Oral Given 11/26/22 1017)  clopidogrel (PLAVIX) tablet 75 mg (75 mg Oral Given 11/26/22 1017)  food thickener (SIMPLYTHICK (NECTAR/LEVEL 2/MILDLY THICK)) 10 packet (has no administration in time range)  ezetimibe (ZETIA) tablet 10 mg (10 mg Oral Given 11/26/22 1017)  methenamine (MANDELAMINE) tablet 500 mg (500 mg Oral Given 11/25/22 2314)  haloperidol lactate (HALDOL) injection 2 mg (2 mg Intravenous Given 11/25/22 2225)  QUEtiapine (SEROQUEL) tablet 25 mg (has no administration in time range)  donepezil (ARICEPT) tablet 10 mg (has no administration in time range)   stroke: early stages of recovery book ( Does not apply Given 11/25/22 1331)  sodium chloride 0.9 % bolus 500 mL (0 mLs Intravenous Stopped 11/25/22 0029)  cefTRIAXone (ROCEPHIN) 2 g in sodium chloride 0.9 % 100 mL IVPB (0 g Intravenous Stopped  11/24/22 2347)  iohexol (OMNIPAQUE) 350 MG/ML injection 75 mL (75 mLs Intravenous Contrast Given 11/24/22 2353)    Mobility Currently non-ambulatory     Focused Assessments Neuro Assessment Handoff:  Swallow screen pass? No  Cardiac Rhythm: Normal sinus rhythm NIH Stroke Scale  Dizziness Present: No Headache Present: No Interval: Other (Comment) Level of Consciousness (1a.)   : Alert, keenly responsive LOC Questions (1b. )   : Answers neither question correctly LOC Commands (1c. )   : Performs both tasks correctly Best Gaze (2. )  : Normal Visual (3. )  : No visual loss Facial Palsy (4. )    :  Minor paralysis Motor Arm, Left (5a. )   : Drift Motor Arm, Right (5b. ) : No drift Motor Leg, Left (6a. )  : Drift Motor Leg, Right (6b. ) : No drift Limb Ataxia (7. ): Present in two limbs Sensory (8. )  : Normal, no sensory loss Best Language (9. )  : No aphasia Dysarthria (10. ): Normal Extinction/Inattention (11.)   : No Abnormality Complete NIHSS TOTAL: 7     Neuro Assessment: Exceptions to WDL Neuro Checks:   Shift assessment (11/25/22 1900)  Has TPA been given? No If patient is a Neuro Trauma and patient is going to OR before floor call report to [facility] nurse: [phone] or [phone]   R Recommendations: See Admitting Provider Note  Report given to:   Additional Notes: Family has been at bedside. Patient follows commands and swallows pills in apple sauce, has dysphagia diet. Patient is alert but calm at this time, still fidgeting and pulling at things but in soft restraints.

## 2022-11-26 NOTE — Progress Notes (Signed)
Trevor Phillips  TKZ:[id number] DOB: Apr 18, 1931 DOA: 11/24/2022 PCP: Lajean Manes, MD    Brief Narrative:  89 year old ALF resident with a history of dementia, prostate cancer status post radical, bladder neck contracture, diverticular disease, urinary retention, and recurrent UTI who was found down following an unwitnessed fall with left-sided weakness being appreciated afterwards.  In the ED CT head and cervical spine was negative for acute findings but MRI revealed an acute right pons CVA.  Consultants:  Urology Neurology  Goals of Care:  Code Status: Full Code   DVT prophylaxis: Lovenox  Interim Hx: The patient exhibited significant confusion and delirium overnight, and was frequently disrupting medical equipment.  Blood pressure significantly elevated this morning and throughout the night.  Afebrile.  Vitals otherwise stable.  Assessment & Plan:  Acute CVA R pons  -Resultant left-sided weakness and facial droop -Revealed on MRI of brain -CTa head and neck reveals no LVO but severe stenosis in the distal right ICA, distal right A3, proximal left V4, and proximal left P2 with 75% stenosis in the proximal ICA bilaterally as well as disease of the left vertebral artery and right vertebral artery -TTE notes mitral valve prolapse which appears to be mild with a EF 75% and no WMA with no obvious intracardiac source of embolization and no intracardiac shunt - felt to be due to severe concurrent extra cranial and intracranial anterior and posterior circulation occlusive atherosclerotic disease  -Daily ASA '81mg'$  plus Plavx for 3 months then aspirin 81 mg alone per Neurology -LDL 123 - continue Zetia -add Lipitor  -A1c 5.2 -PT/OT/SLP -suggestion is for SNF rehab -Allow for permissive hypertension  Elevated BP No prior hx of HTN - not on BP meds - follow w/o tx for now   Dysphagia Patient failed his initial stroke swallow screen -SLP subsequently cleared him for dysphagia 3 diet with  nectar thick liquids  Acute urinary retention Encountered during ED stay -history of prostate cancer status post radical prostatectomy with bladder neck contracture -Foley catheter placed by Urology and is to remain in place for 1 week -follow-up in the Urology clinic  Acute kidney injury Creatinine 1.32 at presentation -likely related to simple dehydration and possible urinary retention -creatinine improving presently  Dementia with acute delirium At baseline is reportedly oriented to person and place but not time - lives in ALF -avoid benzodiazepines -begin nightly Seroquel -as needed Haldol during the day   Family Communication: No family present at time of exam today Disposition: Will need SNF for rehab stay with patient and son agreeing to this -he is now medically stable for discharge to SNF   Objective: Blood pressure (!) 189/92, pulse 70, temperature 98.1 F (36.7 C), temperature source Oral, resp. rate 16, SpO2 100 %. No intake or output data in the 24 hours ending 11/26/22 0757 There were no vitals filed for this visit.  Examination: General: No acute respiratory distress Lungs: Clear to auscultation bilaterally without wheezes or crackles Cardiovascular: Regular rate and rhythm without murmur gallop or rub normal S1 and S2 Abdomen: Nontender, nondistended, soft, bowel sounds positive Extremities: No significant cyanosis, clubbing, or edema bilateral lower extremities  CBC: Recent Labs  Lab 11/24/22 1535 11/26/22 0504  WBC 12.9* 11.3*  NEUTROABS 11.1*  --   HGB 14.6 14.3  HCT 43.2 42.5  MCV 95.8 96.2  PLT 212 573    Basic Metabolic Panel: Recent Labs  Lab 11/24/22 1535 11/26/22 0504  NA 138 138  K 4.3 3.7  CL  105 107  CO2 23 18*  GLUCOSE 98 95  BUN 29* 24*  CREATININE 1.32* 1.24  CALCIUM 9.6 9.3    GFR: CrCl cannot be calculated (Unknown ideal weight.).   Scheduled Meds:  aspirin  81 mg Oral Daily   atorvastatin  40 mg Oral Daily   clopidogrel   75 mg Oral Daily   enoxaparin (LOVENOX) injection  40 mg Subcutaneous Q24H   ezetimibe  10 mg Oral Daily   methenamine  500 mg Oral BID   sodium chloride flush  3 mL Intravenous Once      LOS: 1 day   Cherene Altes, MD [facility] Office  [phone] Pager - Text Page per Shea Evans  If 7PM-7AM, please contact night-coverage per Amion 11/26/2022, 7:57 AM

## 2022-11-26 NOTE — Plan of Care (Signed)
  Problem: Coping: Goal: Will identify appropriate support needs Outcome: Progressing   Problem: Health Behavior/Discharge Planning: Goal: Ability to manage health-related needs will improve Outcome: Progressing

## 2022-11-26 NOTE — ED Notes (Signed)
Help get patient straighten up in bed patient is resting with call bell in reach

## 2022-11-26 NOTE — ED Provider Notes (Signed)
[facility] EMERGENCY DEPARTMENT Provider Note   CSN: [id number] Arrival date & time: 11/24/22  1509     History  Chief Complaint  Patient presents with   Fall   Dementia     Trevor Phillips is a 89 y.o. male.  HPI     89yo male with history of dementia, CVA, bladder neck contracture, prostate cancer, who presents with concern for fall and left sided weakness.  Son reports his brother had to help him get up from dinner and walk last night which was unusual.  He was found down in his ALF room.  Unclear timing of LNW, thinks yetserday but may have been off 2 days ago.    Past Medical History:  Diagnosis Date   Anemia due to acute blood loss    gross hematuria   Bladder neck contracture    chronic   Diverticulosis of colon    Foley catheter in place    placed 07-11-2016   Gross hematuria    History of cerebral infarction    per CT 05-18-2012  chronic lacunar infarcts in basal ganglia  (silent?,  pt denies history S&S)   History of colon polyps    2004   History of diverticulitis    2014   History of prostate cancer 2003   s/p  radical prostatectomy 03-21-2002--  no recurrence (last PSA 07/2016  0.01)   History of recurrent UTIs    History of urinary retention    Neurogenic bladder    since prostatectomy 2003   Self-catheterizes urinary bladder    SECONDARY TO NEUROGENIC BLADDER    Urinary retention    Wears glasses     Home Medications Prior to Admission medications   Medication Sig Start Date End Date Taking? Authorizing Provider  cetirizine (ZYRTEC) 10 MG tablet Take 10 mg by mouth every morning.    Yes [provider]  Cholecalciferol (VITAMIN D3) 2000 UNITS TABS Take 2,000 Units by mouth daily.   Yes [provider]  donepezil (ARICEPT) 10 MG tablet Take 10 mg by mouth at bedtime.   Yes [provider]  ezetimibe (ZETIA) 10 MG tablet Take 10 mg by mouth every evening.    Yes [provider]  ferrous  sulfate 325 (65 FE) MG EC tablet Take 325 mg by mouth daily with breakfast.   Yes [provider]  methenamine (HIPREX) 1 G tablet Take 0.5 g by mouth 2 (two) times daily with a meal.   Yes [provider]      Allergies    Patient has no known allergies.    Review of Systems   Review of Systems  Physical Exam Updated Vital Signs BP (!) 177/141   Pulse 91   Temp 98.1 F (36.7 C) (Oral)   Resp 17   SpO2 99%  Physical Exam Vitals and nursing note reviewed.  Constitutional:      General: He is not in acute distress.    Appearance: Normal appearance. He is well-developed. He is not ill-appearing or diaphoretic.  HENT:     Head: Normocephalic and atraumatic.  Eyes:     General: No visual field deficit.    Extraocular Movements: Extraocular movements intact.     Conjunctiva/sclera: Conjunctivae normal.     Pupils: Pupils are equal, round, and reactive to light.  Cardiovascular:     Rate and Rhythm: Normal rate and regular rhythm.     Pulses: Normal pulses.     Heart  sounds: Normal heart sounds. No murmur heard.    No friction rub. No gallop.  Pulmonary:     Effort: Pulmonary effort is normal. No respiratory distress.     Breath sounds: Normal breath sounds. No wheezing or rales.  Abdominal:     General: There is no distension.     Palpations: Abdomen is soft.     Tenderness: There is no abdominal tenderness. There is no guarding.  Musculoskeletal:        General: No swelling or tenderness (no c/t/l spine tenderness).     Cervical back: Normal range of motion.  Skin:    General: Skin is warm and dry.     Findings: No erythema or rash.  Neurological:     General: No focal deficit present.     Mental Status: He is alert and oriented to person, place, and time.     GCS: GCS eye subscore is 4. GCS verbal subscore is 5. GCS motor subscore is 6.     Cranial Nerves: No cranial nerve deficit, dysarthria or facial asymmetry.     Sensory: No sensory deficit.      Motor: Weakness (left sided facial droop, left sided weakness) present. No tremor.     Coordination: Coordination normal. Finger-Nose-Finger Test normal.     Gait: Gait normal.     ED Results / Procedures / Treatments   Labs (all labs ordered are listed, but only abnormal results are displayed) Labs Reviewed  CBC WITH DIFFERENTIAL/PLATELET - Abnormal; Notable for the following components:      Result Value   WBC 12.9 (*)    Neutro Abs 11.1 (*)    Abs Immature Granulocytes 0.12 (*)    All other components within normal limits  COMPREHENSIVE METABOLIC PANEL - Abnormal; Notable for the following components:   BUN 29 (*)    Creatinine, Ser 1.32 (*)    Total Bilirubin 2.0 (*)    GFR, Estimated 52 (*)    All other components within normal limits  URINALYSIS, ROUTINE W REFLEX MICROSCOPIC - Abnormal; Notable for the following components:   Hgb urine dipstick LARGE (*)    Ketones, ur 20 (*)    RBC / HPF >50 (*)    Bacteria, UA RARE (*)    All other components within normal limits  PROTIME-INR - Abnormal; Notable for the following components:   Prothrombin Time 15.6 (*)    INR 1.3 (*)    All other components within normal limits  LIPID PANEL - Abnormal; Notable for the following components:   LDL Cholesterol 123 (*)    All other components within normal limits  BASIC METABOLIC PANEL - Abnormal; Notable for the following components:   CO2 18 (*)    BUN 24 (*)    GFR, Estimated 56 (*)    All other components within normal limits  CBC - Abnormal; Notable for the following components:   WBC 11.3 (*)    All other components within normal limits  CBG MONITORING, ED - Abnormal; Notable for the following components:   Glucose-Capillary 103 (*)    All other components within normal limits  APTT  HEMOGLOBIN A1C  I-STAT CHEM 8, ED  CBG MONITORING, ED  TROPONIN I (HIGH SENSITIVITY)  TROPONIN I (HIGH SENSITIVITY)    EKG EKG Interpretation  Date/Time:  Tuesday November 24 2022  16:31:33 EST Ventricular Rate:  78 PR Interval:  191 QRS Duration: 84 QT Interval:  389 QTC Calculation: 444 R Axis:   38  Text Interpretation: Sinus rhythm Nonspecific ST abnormality Confirmed by Lajean Saver [phone]) on 11/25/2022 4:09:01 PM  Radiology ECHOCARDIOGRAM COMPLETE  Result Date: 11/25/2022    ECHOCARDIOGRAM REPORT   Patient Name:   Trevor Phillips Date of Exam: 11/25/2022 Medical Rec #:  [id number]      Height:       65.0 in Accession #:    [id number]     Weight:       132.3 lb Date of Birth:  Jul 02, 1931      BSA:          1.660 m Patient Age:    20 years       BP:           164/79 mmHg Patient Gender: M              HR:           88 bpm. Exam Location:  Inpatient Procedure: 2D Echo, Color Doppler and Cardiac Doppler Indications:    Stroke i63.9  History:        Patient has no prior history of Echocardiogram examinations.  Sonographer:    Raquel Sarna Senior RDCS Referring Phys: CHING T TU IMPRESSIONS  1. 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  2. Left ventricular ejection fraction, by estimation, is >75%. The left ventricle has hyperdynamic function. The left ventricle has no regional wall motion abnormalities. Left ventricular diastolic parameters are consistent with Grade I diastolic dysfunction (impaired relaxation). Elevated left atrial pressure.  3. Right ventricular systolic function is normal. The right ventricular size is normal.  4. The mitral valve is abnormal. Mild mitral valve regurgitation. No evidence of mitral stenosis. There is moderate holosystolic prolapse of posterior leaflet of the mitral valve.  5. The aortic valve is tricuspid. Aortic valve regurgitation is not visualized. Aortic valve sclerosis/calcification is present, without any evidence of aortic stenosis.  6. The inferior vena cava is normal in size with greater than 50% respiratory variability, suggesting right atrial pressure  of 3 mmHg. FINDINGS  Left Ventricle: Left ventricular ejection fraction, by estimation, is >75%. The left ventricle has hyperdynamic function. The left ventricle has no regional wall motion abnormalities. The left ventricular internal cavity size was normal in size. There is no left ventricular hypertrophy. Left ventricular diastolic parameters are consistent with Grade I diastolic dysfunction (impaired relaxation). Elevated left atrial pressure. Right Ventricle: The right ventricular size is normal. Right ventricular systolic function is normal. Left Atrium: Left atrial size was normal in size. Right Atrium: Right atrial size was normal in size. Pericardium: Trivial pericardial effusion is present. Mitral Valve: The mitral valve is abnormal. There is moderate holosystolic prolapse of posterior leaflet of the mitral valve. Mild mitral valve regurgitation. No evidence of mitral valve stenosis. Tricuspid Valve: The tricuspid valve is normal in structure. Tricuspid valve regurgitation is trivial. No evidence of tricuspid stenosis. Aortic Valve: The aortic valve is tricuspid. Aortic valve regurgitation is not visualized. Aortic valve sclerosis/calcification is present, without any evidence of aortic stenosis. Pulmonic Valve: The pulmonic valve was normal in structure. Pulmonic valve regurgitation is not visualized. No evidence of pulmonic stenosis. Aorta: The aortic root is normal in size and structure. Venous: The inferior vena cava is normal in size with greater than 50% respiratory variability, suggesting right atrial pressure of 3 mmHg. IAS/Shunts: No atrial level shunt detected by color flow Doppler. Additional Comments: Prolapse of posterior MV leaflet with  eccentric, anteriorly directed MR; appears mild but with splay artifact and may be underestimated.  LEFT VENTRICLE PLAX 2D LVIDd:         3.60 cm   Diastology LVIDs:         2.20 cm   LV e' medial:    4.03 cm/s LV PW:         1.10 cm   LV E/e' medial:  17.8 LV  IVS:        1.00 cm   LV e' lateral:   8.27 cm/s LVOT diam:     2.10 cm   LV E/e' lateral: 8.7 LV SV:         49 LV SV Index:   30 LVOT Area:     3.46 cm  RIGHT VENTRICLE RV S prime:     16.30 cm/s TAPSE (M-mode): 1.8 cm LEFT ATRIUM             Index        RIGHT ATRIUM           Index LA diam:        3.30 cm 1.99 cm/m   RA Area:     10.90 cm LA Vol (A2C):   27.4 ml 16.51 ml/m  RA Volume:   20.00 ml  12.05 ml/m LA Vol (A4C):   23.7 ml 14.28 ml/m LA Biplane Vol: 26.0 ml 15.67 ml/m  AORTIC VALVE LVOT Vmax:   72.80 cm/s LVOT Vmean:  49.600 cm/s LVOT VTI:    0.142 m  AORTA Ao Root diam: 3.10 cm Ao Asc diam:  3.00 cm MITRAL VALVE MV Area (PHT): 3.07 cm    SHUNTS MV Decel Time: 247 msec    Systemic VTI:  0.14 m MV E velocity: 71.60 cm/s  Systemic Diam: 2.10 cm MV A velocity: 90.10 cm/s MV E/A ratio:  0.79 Kirk Ruths MD Electronically signed by Kirk Ruths MD Signature Date/Time: 11/25/2022/12:48:34 PM    Final    CT ANGIO HEAD NECK W WO CM  Result Date: 11/25/2022 CLINICAL DATA:  Acute infarct on MRI EXAM: CT ANGIOGRAPHY HEAD AND NECK TECHNIQUE: Multidetector CT imaging of the head and neck was performed using the standard protocol during bolus administration of intravenous contrast. Multiplanar CT image reconstructions and MIPs were obtained to evaluate the vascular anatomy. Carotid stenosis measurements (when applicable) are obtained utilizing NASCET criteria, using the distal internal carotid diameter as the denominator. RADIATION DOSE REDUCTION: This exam was performed according to the departmental dose-optimization program which includes automated exposure control, adjustment of the mA and/or kV according to patient size and/or use of iterative reconstruction technique. CONTRAST:  79m OMNIPAQUE IOHEXOL 350 MG/ML SOLN COMPARISON:  No prior CTA head neck; correlation is made with CT head 11/24/2022 and MRI head 11/24/2022 FINDINGS: CT HEAD FINDINGS For noncontrast findings, please see 11/24/2022 CT  head. CTA NECK FINDINGS Aortic arch: Standard branching. Imaged portion shows no evidence of aneurysm or dissection. No significant stenosis of the major arch vessel origins. Aortic atherosclerosis. Right carotid system: Severe stenosis in the distal right common carotid, at the bifurcation, in the proximal ECA, and in the proximal ICA, with up to 75% stenosis in the proximal ICA. No evidence of dissection or occlusion. Left carotid system: 75% stenosis in the proximal left ICA, with moderate stenosis at the bifurcation and in the proximal ECA. No evidence of dissection or occlusion. Vertebral arteries: Moderate stenosis at the origin of the left vertebral artery, with severe stenosis in the proximal  left P1. Moderate noncalcified stenosis in the left V2 (series 8, image 200). The left vertebral artery is otherwise patent to the skull base, with severe stenosis at the V3-V4 junction. Severe stenosis of the origin of the right vertebral artery, which is otherwise patent to the skull base without significant stenosis. Skeleton: No acute osseous abnormality. Degenerative changes in the cervical spine. Other neck: 5 mm hypoenhancing nodule in the right thyroid lobe, for which no follow-up is currently indicated. (Reference: J Am Coll Radiol. 2015 Feb;12(2): 143-50) Upper chest: No focal pulmonary opacity or pleural effusion. Review of the MIP images confirms the above findings CTA HEAD FINDINGS Anterior circulation: Both internal carotid arteries are patent to the termini, with severe stenosis in the right ICA, near the terminus (series 8, images 107 and 104), and mild stenosis in the bilateral proximal supraclinoid ICA bilaterally. A1 segments patent, with multifocal mild stenosis (series 8, image 100 and 101). Anterior cerebral arteries are patent to their distal aspects with multifocal irregularity and more focal severe stenosis in the distal right A3 (series 8, image 83) and mild stenosis in the proximal A2  bilaterally (series 8, images 94 and 97). No M1 stenosis or occlusion. Mild stenosis in the proximal right M2 (series 8, image 115). MCA branches otherwise perfused and largely symmetric. Posterior circulation: Vertebral arteries patent to the vertebrobasilar junction, with severe stenosis in the proximal left V4 (series 8, image 166 and 157) and moderate to severe stenosis in the mid right V4 (series 8, image 152). Posterior inferior cerebellar arteries patent proximally. Basilar patent to its distal aspect. Superior cerebellar arteries patent proximally, with mild stenosis in the proximal left SCA (series 8, image 100). Patent P1 segments, with mild stenosis at the left P1 origin (series 8, image 98) and in the mid right P1 (series 8, image 94). Severe stenosis in the proximal left P2 (series 8, image 105) and moderate stenosis in the proximal right P2 (series 8, image 102). Additional mild and moderate multifocal stenosis in the bilateral P2 segments (series 8, image 107). The bilateral posterior communicating arteries are not visualized. Venous sinuses: As permitted by contrast timing, patent. Anatomic variants: None significant. Review of the MIP images confirms the above findings IMPRESSION: 1. No intracranial large vessel occlusion. 2. Severe stenosis in the distal right ICA, distal right A3, proximal left V4 and in the proximal left P2. Additional moderate to severe stenosis in the mid right V4. Milder stenosis as described above. 3. Approximately 75% stenosis in the proximal ICA bilaterally. 4. Moderate stenosis at the origin of the left vertebral artery and severe stenosis at the origin of the right vertebral artery, with additional moderate stenosis in the left V2 and severe stenosis at the left V3-V4 junction. 5. Aortic atherosclerosis. Aortic Atherosclerosis (ICD10-I70.0). Electronically Signed   By: Merilyn Baba M.D.   On: 11/25/2022 01:16   MR BRAIN WO CONTRAST  Result Date: 11/24/2022 CLINICAL  DATA:  Found down, unwitnessed fall, left-sided weakness EXAM: MRI HEAD WITHOUT CONTRAST TECHNIQUE: Multiplanar, multiecho pulse sequences of the brain and surrounding structures were obtained without intravenous contrast. COMPARISON:  12/09/2016 FINDINGS: Brain: Restricted diffusion with ADC correlate in the anterior right pons (series 2, image 19), which measures up to 11 mm in greatest dimension. This is associated with minimally increased T2 hyperintense signal. No acute hemorrhage, mass, mass effect, or midline shift. No hemosiderin deposition to suggest remote hemorrhage. No hydrocephalus or extra-axial collection. Remote lacunar infarcts in the left basal ganglia. T2 hyperintense  signal in the periventricular white matter, likely the sequela of chronic small vessel ischemic disease. Vascular: Normal arterial flow voids. Skull and upper cervical spine: Normal marrow signal. Sinuses/Orbits: Clear paranasal sinuses. Status post bilateral lens replacements. Other: The mastoids are well aerated. IMPRESSION: Acute infarct in the anterior right pons. These results were called by telephone at the time of interpretation on 11/24/2022 at 7:15 pm to provider [facility] , who verbally acknowledged these results. Electronically Signed   By: Merilyn Baba M.D.   On: 11/24/2022 19:14   CT Head Wo Contrast  Result Date: 11/24/2022 CLINICAL DATA:  Neck trauma, unwitnessed fall, left-sided weakness EXAM: CT HEAD WITHOUT CONTRAST CT CERVICAL SPINE WITHOUT CONTRAST TECHNIQUE: Multidetector CT imaging of the head and cervical spine was performed following the standard protocol without intravenous contrast. Multiplanar CT image reconstructions of the cervical spine were also generated. RADIATION DOSE REDUCTION: This exam was performed according to the departmental dose-optimization program which includes automated exposure control, adjustment of the mA and/or kV according to patient size and/or use of iterative  reconstruction technique. COMPARISON:  CT scan 05/19/2012 FINDINGS: CT HEAD FINDINGS Brain: Chronic torcula adipose tissue, clinically irrelevant in unchanged from the prior exam. Remote lacunar infarcts in the basal ganglia and left internal capsule not substantially changed from prior. Suspected small left upper thalamic lacunar infarct, image 19 series 3. Otherwise, the brainstem, cerebellum, cerebral peduncles, thalamus, basal ganglia, basilar cisterns, and ventricular system appear within normal limits. No intracranial hemorrhage, mass lesion, or acute CVA. Vascular: There is atherosclerotic calcification of the cavernous carotid arteries bilaterally. Skull: Unremarkable Sinuses/Orbits: Mild chronic right frontal sinusitis. Mild right mastoid effusion. Other: No supplemental non-categorized findings. CT CERVICAL SPINE FINDINGS Alignment: Stable grade 1 degenerative retrolisthesis at C3-4. Skull base and vertebrae: Degenerative findings at the anterior C1-2 articulation. Remote appearing 30% compression fracture at T3, likely previously with middle column involvement, I do not see a well-defined fracture planes so this is probably chronic. Probably late subacute superior endplate compression fracture at T2 with about 10% loss of height. No cervical spine fracture. Soft tissues and spinal canal: Branch vessel and aortic arch atherosclerotic vascular calcification. Bilateral common carotid artery atherosclerosis. Disc levels: Mild osseous foraminal stenosis bilaterally at C3-4 due to facet and uncinate spurring. Upper chest: Biapical pleuroparenchymal scarring. Other: No supplemental non-categorized findings. IMPRESSION: 1. No acute intracranial findings or acute cervical spine findings. 2. Probably late subacute superior endplate compression fracture at T2 with about 10% loss of height. 3. Remote 30% compression fracture at T3, likely previously with middle column involvement. 4. Remote lacunar infarcts in the  basal ganglia and left internal capsule. 5. Mild chronic right frontal sinusitis. 6. Mild right mastoid effusion. 7. Mild osseous foraminal stenosis bilaterally at C3-4 due to facet and uncinate spurring. 8. Atherosclerosis. Aortic Atherosclerosis (ICD10-I70.0). Electronically Signed   By: Van Clines M.D.   On: 11/24/2022 16:33   CT Cervical Spine Wo Contrast  Result Date: 11/24/2022 CLINICAL DATA:  Neck trauma, unwitnessed fall, left-sided weakness EXAM: CT HEAD WITHOUT CONTRAST CT CERVICAL SPINE WITHOUT CONTRAST TECHNIQUE: Multidetector CT imaging of the head and cervical spine was performed following the standard protocol without intravenous contrast. Multiplanar CT image reconstructions of the cervical spine were also generated. RADIATION DOSE REDUCTION: This exam was performed according to the departmental dose-optimization program which includes automated exposure control, adjustment of the mA and/or kV according to patient size and/or use of iterative reconstruction technique. COMPARISON:  CT scan 05/19/2012 FINDINGS: CT HEAD FINDINGS Brain:  Chronic torcula adipose tissue, clinically irrelevant in unchanged from the prior exam. Remote lacunar infarcts in the basal ganglia and left internal capsule not substantially changed from prior. Suspected small left upper thalamic lacunar infarct, image 19 series 3. Otherwise, the brainstem, cerebellum, cerebral peduncles, thalamus, basal ganglia, basilar cisterns, and ventricular system appear within normal limits. No intracranial hemorrhage, mass lesion, or acute CVA. Vascular: There is atherosclerotic calcification of the cavernous carotid arteries bilaterally. Skull: Unremarkable Sinuses/Orbits: Mild chronic right frontal sinusitis. Mild right mastoid effusion. Other: No supplemental non-categorized findings. CT CERVICAL SPINE FINDINGS Alignment: Stable grade 1 degenerative retrolisthesis at C3-4. Skull base and vertebrae: Degenerative findings at the  anterior C1-2 articulation. Remote appearing 30% compression fracture at T3, likely previously with middle column involvement, I do not see a well-defined fracture planes so this is probably chronic. Probably late subacute superior endplate compression fracture at T2 with about 10% loss of height. No cervical spine fracture. Soft tissues and spinal canal: Branch vessel and aortic arch atherosclerotic vascular calcification. Bilateral common carotid artery atherosclerosis. Disc levels: Mild osseous foraminal stenosis bilaterally at C3-4 due to facet and uncinate spurring. Upper chest: Biapical pleuroparenchymal scarring. Other: No supplemental non-categorized findings. IMPRESSION: 1. No acute intracranial findings or acute cervical spine findings. 2. Probably late subacute superior endplate compression fracture at T2 with about 10% loss of height. 3. Remote 30% compression fracture at T3, likely previously with middle column involvement. 4. Remote lacunar infarcts in the basal ganglia and left internal capsule. 5. Mild chronic right frontal sinusitis. 6. Mild right mastoid effusion. 7. Mild osseous foraminal stenosis bilaterally at C3-4 due to facet and uncinate spurring. 8. Atherosclerosis. Aortic Atherosclerosis (ICD10-I70.0). Electronically Signed   By: Van Clines M.D.   On: 11/24/2022 16:33   DG Chest 2 View  Result Date: 11/24/2022 CLINICAL DATA:  Short of breath, previous tobacco abuse EXAM: CHEST - 2 VIEW COMPARISON:  02/20/2022 FINDINGS: Frontal and lateral views of the chest demonstrate an unremarkable cardiac silhouette. Stable emphysema. No airspace disease, effusion, or pneumothorax. Chronic pleural thickening at the left costophrenic angle. No acute bony abnormalities. IMPRESSION: 1. Stable chest, no acute process. Electronically Signed   By: Randa Ngo M.D.   On: 11/24/2022 16:14    Procedures Procedures    Medications Ordered in ED Medications  sodium chloride flush (NS) 0.9 %  injection 3 mL (3 mLs Intravenous Not Given 11/24/22 1647)  acetaminophen (TYLENOL) tablet 650 mg (has no administration in time range)  enoxaparin (LOVENOX) injection 40 mg (40 mg Subcutaneous Given 11/25/22 2228)  aspirin chewable tablet 81 mg (81 mg Oral Given 11/26/22 1017)  atorvastatin (LIPITOR) tablet 40 mg (40 mg Oral Given 11/26/22 1017)  clopidogrel (PLAVIX) tablet 75 mg (75 mg Oral Given 11/26/22 1017)  food thickener (SIMPLYTHICK (NECTAR/LEVEL 2/MILDLY THICK)) 10 packet (has no administration in time range)  ezetimibe (ZETIA) tablet 10 mg (10 mg Oral Given 11/26/22 1017)  methenamine (MANDELAMINE) tablet 500 mg (500 mg Oral Given 11/25/22 2314)  haloperidol lactate (HALDOL) injection 2 mg (2 mg Intravenous Given 11/25/22 2225)  QUEtiapine (SEROQUEL) tablet 25 mg (has no administration in time range)  donepezil (ARICEPT) tablet 10 mg (has no administration in time range)   stroke: early stages of recovery book ( Does not apply Given 11/25/22 1331)  sodium chloride 0.9 % bolus 500 mL (0 mLs Intravenous Stopped 11/25/22 0029)  cefTRIAXone (ROCEPHIN) 2 g in sodium chloride 0.9 % 100 mL IVPB (0 g Intravenous Stopped 11/24/22 2347)  iohexol (OMNIPAQUE)  350 MG/ML injection 75 mL (75 mLs Intravenous Contrast Given 11/24/22 2353)    ED Course/ Medical Decision Making/ A&P                              89yo male with history of dementia, CVA, bladder neck contracture, prostate cancer, who presents with concern for fall and left sided weakness.  Noted to have left sided weakness and left facial droop.  DDx includes stroke, ICH, electrolyte abnormality.  LNW unclear and believed to be more than 24 hours and Code Stroke not called.   Labs completed and personally evaluated by me shows no clinically significant electrolyte abnormalities, no anemia.  CT head and Cspine completed and personally evaluated by me show no acute intracranial findings. Remote infarcts and remote fx noted, no tenderness to back  and doubt acute fracture.    Discussed presentation with Dr. Quinn Axe, recommends MRI with ddx including new stroke or stroke recrudescence.    MRI shows anterior right pontine infarct.  Consulted Neurology and hospitalist for admission.  Pt also with urinary retention (history of same). RN unable to place coudet catheter and Urology, Dr. Gloriann Loan consulted and came to bedside for foley catheter placement.          Final Clinical Impression(s) / ED Diagnoses Final diagnoses:  Cerebrovascular accident (CVA), unspecified mechanism ([place])    Rx / DC Orders ED Discharge Orders     None         Gareth Morgan, MD 11/26/22 1444

## 2022-11-26 NOTE — Plan of Care (Signed)
  Problem: Education: Goal: Knowledge of disease or condition will improve Outcome: Not Progressing Goal: Knowledge of secondary prevention will improve (MUST DOCUMENT ALL) Outcome: Not Progressing Goal: Knowledge of patient specific risk factors will improve Elta [place] N/A or DELETE if not current risk factor) Outcome: Not Progressing   Problem: Ischemic Stroke/TIA Tissue Perfusion: Goal: Complications of ischemic stroke/TIA will be minimized Outcome: Not Progressing   Problem: Coping: Goal: Will verbalize positive feelings about self Outcome: Not Progressing Goal: Will identify appropriate support needs Outcome: Not Progressing   Problem: Health Behavior/Discharge Planning: Goal: Ability to manage health-related needs will improve Outcome: Not Progressing Goal: Goals will be collaboratively established with patient/family Outcome: Not Progressing   Problem: Self-Care: Goal: Ability to participate in self-care as condition permits will improve Outcome: Not Progressing Goal: Verbalization of feelings and concerns over difficulty with self-care will improve Outcome: Not Progressing Goal: Ability to communicate needs accurately will improve Outcome: Not Progressing   Problem: Nutrition: Goal: Risk of aspiration will decrease Outcome: Not Progressing Goal: Dietary intake will improve Outcome: Not Progressing   Problem: Safety: Goal: Non-violent Restraint(s) Outcome: Not Progressing   Problem: Education: Goal: Knowledge of General Education information will improve Description: Including pain rating scale, medication(s)/side effects and non-pharmacologic comfort measures Outcome: Not Progressing   Problem: Health Behavior/Discharge Planning: Goal: Ability to manage health-related needs will improve Outcome: Not Progressing   Problem: Clinical Measurements: Goal: Ability to maintain clinical measurements within normal limits will improve Outcome: Not Progressing Goal: Will  remain free from infection Outcome: Not Progressing Goal: Diagnostic test results will improve Outcome: Not Progressing Goal: Respiratory complications will improve Outcome: Not Progressing Goal: Cardiovascular complication will be avoided Outcome: Not Progressing   Problem: Activity: Goal: Risk for activity intolerance will decrease Outcome: Not Progressing   Problem: Nutrition: Goal: Adequate nutrition will be maintained Outcome: Not Progressing   Problem: Coping: Goal: Level of anxiety will decrease Outcome: Not Progressing   Problem: Elimination: Goal: Will not experience complications related to bowel motility Outcome: Not Progressing Goal: Will not experience complications related to urinary retention Outcome: Not Progressing   Problem: Pain Managment: Goal: General experience of comfort will improve Outcome: Not Progressing   Problem: Safety: Goal: Ability to remain free from injury will improve Outcome: Not Progressing   Problem: Skin Integrity: Goal: Risk for impaired skin integrity will decrease Outcome: Not Progressing

## 2022-11-26 NOTE — Progress Notes (Addendum)
STROKE TEAM PROGRESS NOTE   INTERVAL HISTORY Called by nursing due to concern for neuro change. On my exam patient is slightly more confused but consistent with diagnosis of dementia. Able to follow commands. Strength exam is unchanged. Ataxia persists on left   Vitals:   11/26/22 0030 11/26/22 0100 11/26/22 0519 11/26/22 0815  BP: (!) 120/105 (!) 175/90 (!) 189/92 (!) 185/79  Pulse: 87 82 70 75  Resp: 18 (!) '21 16 20  '$ Temp:   98.1 F (36.7 C)   TempSrc:   Oral   SpO2: 100% 99% 100% 100%   CBC:  Recent Labs  Lab 11/24/22 1535 11/26/22 0504  WBC 12.9* 11.3*  NEUTROABS 11.1*  --   HGB 14.6 14.3  HCT 43.2 42.5  MCV 95.8 96.2  PLT 212 412    Basic Metabolic Panel:  Recent Labs  Lab 11/24/22 1535 11/26/22 0504  NA 138 138  K 4.3 3.7  CL 105 107  CO2 23 18*  GLUCOSE 98 95  BUN 29* 24*  CREATININE 1.32* 1.24  CALCIUM 9.6 9.3    Lipid Panel:  Recent Labs  Lab 11/25/22 0237  CHOL 174  TRIG 42  HDL 43  CHOLHDL 4.0  VLDL 8  LDLCALC 123*    HgbA1c:  Recent Labs  Lab 11/24/22 2150  HGBA1C 5.2    Urine Drug Screen: No results for input(s): "LABOPIA", "COCAINSCRNUR", "LABBENZ", "AMPHETMU", "THCU", "LABBARB" in the last 168 hours.  Alcohol Level No results for input(s): "ETH" in the last 168 hours.  IMAGING past 24 hours ECHOCARDIOGRAM COMPLETE  Result Date: 11/25/2022    ECHOCARDIOGRAM REPORT   Patient Name:   Trevor Phillips Date of Exam: 11/25/2022 Medical Rec #:  [id number]      Height:       65.0 in Accession #:    [id number]     Weight:       132.3 lb Date of Birth:  07/01/1931      BSA:          1.660 m Patient Age:    89 years       BP:           164/79 mmHg Patient Gender: M              HR:           88 bpm. Exam Location:  Inpatient Procedure: 2D Echo, Color Doppler and Cardiac Doppler Indications:    Stroke i63.9  History:        Patient has no prior history of Echocardiogram examinations.  Sonographer:    Raquel Sarna Senior RDCS Referring Phys: CHING T TU  IMPRESSIONS  1. 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  2. Left ventricular ejection fraction, by estimation, is >75%. The left ventricle has hyperdynamic function. The left ventricle has no regional wall motion abnormalities. Left ventricular diastolic parameters are consistent with Grade I diastolic dysfunction (impaired relaxation). Elevated left atrial pressure.  3. Right ventricular systolic function is normal. The right ventricular size is normal.  4. The mitral valve is abnormal. Mild mitral valve regurgitation. No evidence of mitral stenosis. There is moderate holosystolic prolapse of posterior leaflet of the mitral valve.  5. The aortic valve is tricuspid. Aortic valve regurgitation is not visualized. Aortic valve sclerosis/calcification is present, without any evidence of aortic stenosis.  6. The inferior vena cava is normal in size with greater than  50% respiratory variability, suggesting right atrial pressure of 3 mmHg. FINDINGS  Left Ventricle: Left ventricular ejection fraction, by estimation, is >75%. The left ventricle has hyperdynamic function. The left ventricle has no regional wall motion abnormalities. The left ventricular internal cavity size was normal in size. There is no left ventricular hypertrophy. Left ventricular diastolic parameters are consistent with Grade I diastolic dysfunction (impaired relaxation). Elevated left atrial pressure. Right Ventricle: The right ventricular size is normal. Right ventricular systolic function is normal. Left Atrium: Left atrial size was normal in size. Right Atrium: Right atrial size was normal in size. Pericardium: Trivial pericardial effusion is present. Mitral Valve: The mitral valve is abnormal. There is moderate holosystolic prolapse of posterior leaflet of the mitral valve. Mild mitral valve regurgitation. No evidence of mitral valve stenosis. Tricuspid  Valve: The tricuspid valve is normal in structure. Tricuspid valve regurgitation is trivial. No evidence of tricuspid stenosis. Aortic Valve: The aortic valve is tricuspid. Aortic valve regurgitation is not visualized. Aortic valve sclerosis/calcification is present, without any evidence of aortic stenosis. Pulmonic Valve: The pulmonic valve was normal in structure. Pulmonic valve regurgitation is not visualized. No evidence of pulmonic stenosis. Aorta: The aortic root is normal in size and structure. Venous: The inferior vena cava is normal in size with greater than 50% respiratory variability, suggesting right atrial pressure of 3 mmHg. IAS/Shunts: No atrial level shunt detected by color flow Doppler. 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  LEFT VENTRICLE PLAX 2D LVIDd:         3.60 cm   Diastology LVIDs:         2.20 cm   LV e' medial:    4.03 cm/s LV PW:         1.10 cm   LV E/e' medial:  17.8 LV IVS:        1.00 cm   LV e' lateral:   8.27 cm/s LVOT diam:     2.10 cm   LV E/e' lateral: 8.7 LV SV:         49 LV SV Index:   30 LVOT Area:     3.46 cm  RIGHT VENTRICLE RV S prime:     16.30 cm/s TAPSE (M-mode): 1.8 cm LEFT ATRIUM             Index        RIGHT ATRIUM           Index LA diam:        3.30 cm 1.99 cm/m   RA Area:     10.90 cm LA Vol (A2C):   27.4 ml 16.51 ml/m  RA Volume:   20.00 ml  12.05 ml/m LA Vol (A4C):   23.7 ml 14.28 ml/m LA Biplane Vol: 26.0 ml 15.67 ml/m  AORTIC VALVE LVOT Vmax:   72.80 cm/s LVOT Vmean:  49.600 cm/s LVOT VTI:    0.142 m  AORTA Ao Root diam: 3.10 cm Ao Asc diam:  3.00 cm MITRAL VALVE MV Area (PHT): 3.07 cm    SHUNTS MV Decel Time: 247 msec    Systemic VTI:  0.14 m MV E velocity: 71.60 cm/s  Systemic Diam: 2.10 cm MV A velocity: 90.10 cm/s MV E/A ratio:  0.79 Kirk Ruths MD Electronically signed by Kirk Ruths MD Signature Date/Time:  11/25/2022/12:48:34 PM    Final     PHYSICAL EXAM  Physical Exam  Constitutional: Appears  well-developed and well-nourished.   Cardiovascular: Normal rate and regular rhythm.  Respiratory: Effort normal, non-labored breathing  Neuro: Mental Status: Patient is awake, alert, oriented to person. Remembers he is in the hospital when prompted. States he is 89 years old. Able to follow mulitstep commands well.  Patient is able to give a clear and coherent history. No signs of aphasia or neglect Cranial Nerves: II: Visual Fields are full. Pupils are equal, round, and reactive to light.   III,IV, VI: EOMI without ptosis or diploplia.  V: Facial sensation is symmetric to temperature VII: Facial movement is symmetric resting and smiling VIII: Hearing is intact to voice X: Phonation normal  XI: Shoulder shrug is symmetric. XII: Tongue protrudes midline without atrophy or fasciculations.  Motor: Tone is normal. Bulk is normal.  RUE 5/5  LUE 4/5 RLE 5/5  LLE 4/5 Sensory: Sensation is symmetric to light touch arms and legs.  Cerebellar: FNF and HKS with ataxia on the left    ASSESSMENT/PLAN Trevor Phillips is a 89 y.o. male with history of dementia, remote prostrate cancer, who presents with fall and left sided weakness. NIH on arrival is a 7.   Stroke:  Right pontine infarct Etiology: Severe concurrent extra cranial and intracranial anterior and posterior circulation occlusive atherosclerotic disease Code Stroke CT head No acute intracranial findings or acute cervical spine findings. Probably late subacute superior endplate compression fracture at T2 with about 10% loss of height. MRI  Acute infarct in the anterior right pons.  MRA  Severe stenosis in the distal right ICA, distal right A3, proximal left V4 and in the proximal left P2. Additional moderate to severe stenosis in the mid right V4. Milder stenosis as described above. 3. Approximately 75% stenosis in the proximal ICA  bilaterally. 4. Moderate stenosis at the origin of the left vertebral artery and severe stenosis at the origin of the right vertebral artery, with additional moderate stenosis in the left V2 and severe stenosis at the left V3-V4 junction. 2D Echo EF greater than 75% LDL 123 HgbA1c 5.2 VTE prophylaxis - Lovenox     Diet   DIET DYS 3 Room service appropriate? Yes with Assist; Fluid consistency: Nectar Thick   No antithrombotic prior to admission, now on aspirin 81 mg daily and clopidogrel 75 mg daily for 3 months and then ASA '81mg'$  alone.  Therapy recommendations:   Disposition:    Hypertension Stable Permissive hypertension (OK if < 220/120) but gradually normalize in 5-7 days Long-term BP goal normotensive  Hyperlipidemia Home meds:  zetia, resumed in hospital LDL 123, goal < 70 Add Atorvastatin '40mg'$   Continue statin at discharge  Other Stroke Risk Factors Advanced Age >/= 48  Hx stroke/TIA  Other Active Problems Dementia  Home meds: Aricept   Hospital day # 1  Patient seen and examined by NP/APP with MD. MD to update note as needed.   Janine Ores, DNP, FNP-BC [facility] Pager: [phone] I have personally obtained history,examined this patient, reviewed notes, independently viewed imaging studies, participated in medical decision making and plan of care.ROS completed by me personally and pertinent positives fully documented  I have made any additions or clarifications directly to the above note. Agree with note above.  Continue aspirin and Plavix for 3 months followed by aspirin alone and aggressive risk factor modification.  Patient has advanced dementia and will need 24-hour care in a nursing home.  Stroke team will sign off.  Kindly call for questions.  Greater than 50%  time during this 35-minute visit was spent on counseling and coordination of care about his pontine stroke and intraventricular atherosclerosis and discussion with patient and care team and  answering questions  Antony Contras, MD Medical Director [place] Pager: [phone] 11/26/2022 1:28 PM   To contact Stroke Continuity provider, please refer to [email or web address] After hours, contact General Neurology

## 2022-11-27 ENCOUNTER — Inpatient Hospital Stay (HOSPITAL_COMMUNITY): Payer: Medicare PPO

## 2022-11-27 DIAGNOSIS — I63011 Cerebral infarction due to thrombosis of right vertebral artery: Secondary | ICD-10-CM | POA: Diagnosis not present

## 2022-11-27 MED ORDER — HALOPERIDOL LACTATE 5 MG/ML IJ SOLN: 1.0000 mg | Freq: Four times a day (QID) | INTRAMUSCULAR | Status: DC | PRN

## 2022-11-27 MED ORDER — TAMSULOSIN HCL 0.4 MG PO CAPS: 0.4000 mg | ORAL_CAPSULE | Freq: Every day | ORAL | Status: DC

## 2022-11-27 NOTE — Progress Notes (Signed)
Trevor Phillips  EPP:[id number] DOB: [birth year] DOA: 11/24/2022 PCP: Lajean Manes, MD    Brief Narrative:  89 year old ALF resident with a history of dementia, prostate cancer status post radical, bladder neck contracture, diverticular disease, urinary retention, and recurrent UTI who was found down following an unwitnessed fall with left-sided weakness being appreciated afterwards.  In the ED CT head and cervical spine was negative for acute findings but MRI revealed an acute right pons CVA.  Consultants:  Urology Neurology  Goals of Care:  Code Status: Full Code   DVT prophylaxis: Lovenox  Interim Hx: No acute events recorded overnight.  Blood pressure elevated at times with systolics up to 606.  Afebrile.  Vital signs otherwise stable.  Resting comfortably in bed at the time of my visit.  Denies new complaints.  Is somewhat more alert today.  Does not appear uncomfortable.  Has done poorly during MBS/SLP eval today.  Assessment & Plan:  Acute CVA R pons  -Resultant left-sided weakness and facial droop -Revealed on MRI of brain -CTa head and neck reveals no LVO but severe stenosis in the distal right ICA, distal right A3, proximal left V4, and proximal left P2 with 75% stenosis in the proximal ICA bilaterally as well as disease of the left vertebral artery and right vertebral artery -TTE notes mitral valve prolapse which appears to be mild with a EF 75% and no WMA with no obvious intracardiac source of embolization and no intracardiac shunt - felt to be due to severe concurrent extracranial and intracranial anterior and posterior circulation occlusive atherosclerotic disease  -Daily ASA '81mg'$  plus Plavx for 3 months then aspirin 81 mg alone per Neurology -LDL 123 - continue Zetia - added Lipitor  -A1c 5.2 -PT/OT/SLP - suggestion is for SNF rehab -Allow for permissive hypertension  Elevated BP No prior hx of HTN - not on BP meds - follow w/o tx for now   Dysphagia Patient failed  his initial stroke swallow screen -due to ongoing concerns of aspiration even with modified diet patient underwent MBS today and did poorly during this evaluation -she he is not an appropriate candidate for tube feeding and therefore I have asked SLP to recommend the safest diet understanding that aspiration will continue to be a risk -this will be discussed with the patient's family  Acute urinary retention Encountered during ED stay -history of prostate cancer status post radical prostatectomy with bladder neck contracture - Foley catheter placed by Urology and is to remain in place for 1 week - follow-up in the Urology clinic  Acute kidney injury Creatinine 1.32 at presentation -likely related to simple dehydration and possible urinary retention -creatinine improving with volume expansion  Dementia with acute delirium At baseline is reportedly oriented to person and place but not time - lives in ALF - avoid benzodiazepines -initiated nightly Seroquel -as needed Haldol during the day -appears to be stabilizing at his probable baseline mental status   Family Communication: No family present at time of exam today Disposition: Will need SNF for rehab stay with patient and son agreeing to this -he is now medically stable for discharge to SNF   Objective: Blood pressure (!) 185/90, pulse 70, temperature 98.5 F (36.9 C), temperature source Oral, resp. rate 16, height '5\' 5"'$  (1.651 m), weight 60 kg, SpO2 100 %.  Intake/Output Summary (Last 24 hours) at 11/27/2022 0851 Last data filed at 11/26/2022 1847 Gross per 24 hour  Intake --  Output 250 ml  Net -250 ml  Filed Weights   11/26/22 1629  Weight: 60 kg    Examination: General: No acute respiratory distress Lungs: Clear to auscultation bilaterally without wheezes or crackles Cardiovascular: Regular rate and rhythm without murmur  Abdomen: Nontender, nondistended, soft, bowel sounds positive Extremities: No significant cyanosis,  clubbing, or edema bilateral lower extremities  CBC: Recent Labs  Lab 11/24/22 1535 11/26/22 0504  WBC 12.9* 11.3*  NEUTROABS 11.1*  --   HGB 14.6 14.3  HCT 43.2 42.5  MCV 95.8 96.2  PLT 212 935    Basic Metabolic Panel: Recent Labs  Lab 11/24/22 1535 11/26/22 0504  NA 138 138  K 4.3 3.7  CL 105 107  CO2 23 18*  GLUCOSE 98 95  BUN 29* 24*  CREATININE 1.32* 1.24  CALCIUM 9.6 9.3    GFR: Estimated Creatinine Clearance: 34.3 mL/min (by C-G formula based on SCr of 1.24 mg/dL).   Scheduled Meds:  aspirin  81 mg Oral Daily   atorvastatin  40 mg Oral Daily   Chlorhexidine Gluconate Cloth  6 each Topical Daily   clopidogrel  75 mg Oral Daily   donepezil  10 mg Oral QHS   enoxaparin (LOVENOX) injection  40 mg Subcutaneous Q24H   ezetimibe  10 mg Oral Daily   methenamine  500 mg Oral BID   QUEtiapine  25 mg Oral QHS   sodium chloride flush  3 mL Intravenous Once      LOS: 2 days   Cherene Altes, MD [facility] Office  [phone] Pager - Text Page per Shea Evans  If 7PM-7AM, please contact night-coverage per Amion 11/27/2022, 8:51 AM

## 2022-11-27 NOTE — NC FL2 (Signed)
[place] LEVEL OF CARE FORM     IDENTIFICATION  Patient Name: Trevor Phillips Birthdate: 1930/12/03 Sex: male Admission Date (Current Location): 11/24/2022  [facility] and [place] Number:  Herbalist and Address:  [facility], [place] [address] [place], [place] [address]      Provider Number: [id number]  Attending Physician Name and Address:  Cherene Altes, MD  Relative Name and Phone Number:  KAY, RICCIUTI [phone]    Current Level of Care: Hospital Recommended Level of Care: [place] Prior Approval Number:    Date Approved/Denied:   PASRR Number:    Discharge Plan: SNF    Current Diagnoses: Patient Active Problem List   Diagnosis Date Noted   CVA (cerebral vascular accident) ([place]) 11/24/2022   AKI (acute kidney injury) ([place]) 11/24/2022   Elevated BP without diagnosis of hypertension 11/24/2022   Acute urinary retention 11/24/2022   Acute blood loss anemia 07/11/2016   Urinary retention with incomplete bladder emptying    Diverticular disease    Hematuria    Cancer of prostate ([place])     Orientation RESPIRATION BLADDER Height & Weight        Normal Indwelling catheter, Incontinent Weight: 132 lb 4.4 oz (60 kg) Height:  '5\' 5"'$  (165.1 cm)  BEHAVIORAL SYMPTOMS/MOOD NEUROLOGICAL BOWEL NUTRITION STATUS      Continent Diet (See d/c summary)  AMBULATORY STATUS COMMUNICATION OF NEEDS Skin   Extensive Assist   Normal                       Personal Care Assistance Level of Assistance  Total care       Total Care Assistance: Maximum assistance   Functional Limitations Info  Sight, Hearing, Speech Sight Info: Adequate Hearing Info: Adequate Speech Info: Adequate    SPECIAL CARE FACTORS FREQUENCY  OT (By licensed OT), PT (By licensed PT)     PT Frequency: 5x/wk OT Frequency: 5x/wk            Contractures Contractures Info: Not present    Additional Factors Info  Code Status  Code Status Info: Full             Current Medications (11/27/2022):  This is the current hospital active medication list Current Facility-Administered Medications  Medication Dose Route Frequency Provider Last Rate Last Admin   acetaminophen (TYLENOL) tablet 650 mg  650 mg Oral Q4H PRN Tu, Ching T, DO       aspirin chewable tablet 81 mg  81 mg Oral Daily Donnetta Simpers, MD   81 mg at 11/27/22 0939   atorvastatin (LIPITOR) tablet 40 mg  40 mg Oral Daily Janine Ores, NP   40 mg at 11/27/22 6160   Chlorhexidine Gluconate Cloth 2 % PADS 6 each  6 each Topical Daily Cherene Altes, MD   6 each at 11/27/22 0940   clopidogrel (PLAVIX) tablet 75 mg  75 mg Oral Daily Janine Ores, NP   75 mg at 11/27/22 0940   donepezil (ARICEPT) tablet 10 mg  10 mg Oral QHS Joette Catching T, MD   10 mg at 11/26/22 2139   enoxaparin (LOVENOX) injection 40 mg  40 mg Subcutaneous Q24H Tu, Ching T, DO   40 mg at 11/26/22 2139   ezetimibe (ZETIA) tablet 10 mg  10 mg Oral Daily Cherene Altes, MD   10 mg at 11/27/22 7371   food thickener (SIMPLYTHICK (NECTAR/LEVEL 2/MILDLY THICK))  10 packet  10 packet Oral PRN Cherene Altes, MD       haloperidol lactate (HALDOL) injection 1 mg  1 mg Intravenous Q6H PRN Cherene Altes, MD       methenamine (MANDELAMINE) tablet 500 mg  500 mg Oral BID Cherene Altes, MD   500 mg at 11/27/22 2694   QUEtiapine (SEROQUEL) tablet 25 mg  25 mg Oral QHS Cherene Altes, MD   25 mg at 11/26/22 2139     Discharge Medications: Please see discharge summary for a list of discharge medications.  Relevant Imaging Results:  Relevant Lab Results:   Additional Information SS#: [id number]  Jinger Neighbors, LCSW

## 2022-11-27 NOTE — TOC Initial Note (Signed)
Transition of Care [facility]) - Initial/Assessment Note    Patient Details  Name: Trevor Phillips MRN: [id number] Date of Birth: [birth year]  Transition of Care [facility]) CM/SW Contact:    Jinger Neighbors, LCSW Phone Number: 11/27/2022, 2:58 PM  Clinical Narrative:                  CSW attempted to meet with pt at bedside; however, he was sleeping and CSW did not interrupt pt's sleep. CSW called Mr. Alexopoulos, pt's son to discuss treatment after hospitalization. Pt's son in agreement with SNF recommendation, but concerns regarding pt's inability to understand reason for cath and scared he may pull the cath out and injur himself. CSW messaged the doctor to discuss concerns; doctor reports urology does not want cath out. CSW followed back up with son to inform him of conversation with doctor; he stated understanding, but still concerned. CSW will continue work up   Expected Discharge Plan: [place] Barriers to Discharge: SNF Pending bed offer   Patient Goals and CMS Choice   CMS Medicare.gov Compare Post Acute Care list provided to:: Patient Represenative (must comment) (Pt's son, Mr. Bardon) Choice offered to / list presented to : Adult Children      Expected Discharge Plan and Services                                              Prior Living Arrangements/Services     Patient language and need for interpreter reviewed:: Yes Do you feel safe going back to the place where you live?: Yes      Need for Family Participation in Patient Care: No (Comment) Care giver support system in place?: No (comment)   Criminal Activity/Legal Involvement Pertinent to Current Situation/Hospitalization: No - Comment as needed  Activities of Daily Living Home Assistive Devices/Equipment: Eyeglasses, Shower chair with back, Environmental consultant (specify type) (4 wheel) ADL Screening (condition at time of admission) Patient's cognitive ability adequate to safely complete daily activities?: Yes Is the  patient deaf or have difficulty hearing?: Yes Does the patient have difficulty seeing, even when wearing glasses/contacts?: No Does the patient have difficulty concentrating, remembering, or making decisions?: Yes Patient able to express need for assistance with ADLs?: Yes Does the patient have difficulty dressing or bathing?: No Independently performs ADLs?: Yes (appropriate for developmental age) Does the patient have difficulty walking or climbing stairs?: Yes Weakness of Legs: None Weakness of Arms/Hands: None  Permission Sought/Granted                  Emotional Assessment Appearance:: Appears stated age Attitude/Demeanor/Rapport: Unable to Assess Affect (typically observed): Unable to Assess        Admission diagnosis:  CVA (cerebral vascular accident) [facility]) [I63.9] Cerebrovascular accident (CVA), unspecified mechanism ([place]) [I63.9] Patient Active Problem List   Diagnosis Date Noted   CVA (cerebral vascular accident) ([place]) 11/24/2022   AKI (acute kidney injury) ([place]) 11/24/2022   Elevated BP without diagnosis of hypertension 11/24/2022   Acute urinary retention 11/24/2022   Acute blood loss anemia 07/11/2016   Urinary retention with incomplete bladder emptying    Diverticular disease    Hematuria    Cancer of prostate ([place])    PCP:  Lajean Manes, MD Pharmacy:   [facility] [phone] - [place], [address]  [place] [address] Phone: [phone] Fax: [phone]     Social Determinants of Health (SDOH) Social History: SDOH Screenings   Food Insecurity: No Food Insecurity (11/26/2022)  Housing: Low Risk  (11/26/2022)  Transportation Needs: No Transportation Needs (11/26/2022)  Utilities: Not At Risk (11/26/2022)  Tobacco Use: Medium Risk (11/24/2022)   SDOH Interventions:     Readmission Risk Interventions     No data to display

## 2022-11-27 NOTE — Progress Notes (Signed)
Modified Barium Swallow Progress Note  Patient Details  Name: Trevor Phillips MRN: [id number] Date of Birth: 12/25/1930  Today's Date: 11/27/2022  Modified Barium Swallow completed.  Full report located under Chart Review in the Imaging Section.  Brief recommendations include the following:  Clinical Impression  Mr. Trevor Phillips presents with a moderate-severe oropharyngeal dysphagia s/p pontine stroke.  There was reduced oral control of bolus, weakened pharyngeal stripping, poor base of tongue contraction and incomplete laryngeal vestibule closure. These motor impairments led to aspiration of thin liquids during the swallow, collection of residuals in his pharynx that accumulated with each subsequent bolus.  Poor clearance of pharynx then led to intermittent spilling of residuals into open airway, sometimes reaching the glottis, sometimes traveling in to the trachea and eliciting and inconsistent cough response. Chin tuck and multiple swallows were not effective in improving bolus transport or protecting airway.  D/W results with Dr. Thereasa Phillips - for now, maintain dysphagia 3 diet with nectar thick liquids - there is really no diet that is safer than the others, aside from avoiding thin liquids which were immediately aspirated. SLP will f/u with MD and family for plan.   Swallow Evaluation Recommendations       SLP Diet Recommendations: Dysphagia 3 (Mech soft) solids;Nectar thick liquid   Liquid Administration via: Cup   Medication Administration: Crushed with puree   Supervision: Staff to assist with self feeding   Compensations: Minimize environmental distractions;Small sips/bites       Oral Care Recommendations: Oral care BID      Trevor Nester L. Tivis Ringer, MA CCC/SLP Clinical Specialist - Acute Care SLP Acute Rehabilitation Services Office number [phone]   Trevor Phillips 11/27/2022,2:19 PM

## 2022-11-27 NOTE — Evaluation (Signed)
Speech Language Pathology Evaluation Patient Details Name: Trevor Phillips MRN: [id number] DOB: [birth year] Today's Date: 11/27/2022 Time: 8841-6606 SLP Time Calculation (min) (ACUTE ONLY): 16 min  Problem List:  Patient Active Problem List   Diagnosis Date Noted   CVA (cerebral vascular accident) ([place]) 11/24/2022   AKI (acute kidney injury) ([place]) 11/24/2022   Elevated BP without diagnosis of hypertension 11/24/2022   Acute urinary retention 11/24/2022   Acute blood loss anemia 07/11/2016   Urinary retention with incomplete bladder emptying    Diverticular disease    Hematuria    Cancer of prostate [facility])    Past Medical History:  Past Medical History:  Diagnosis Date   Anemia due to acute blood loss    gross hematuria   Bladder neck contracture    chronic   Diverticulosis of colon    Foley catheter in place    placed 07-11-2016   Gross hematuria    History of cerebral infarction    per CT 05-18-2012  chronic lacunar infarcts in basal ganglia  (silent?,  pt denies history S&S)   History of colon polyps    2004   History of diverticulitis    2014   History of prostate cancer 2003   s/Trevor Phillips  radical prostatectomy 03-21-2002--  no recurrence (last PSA 07/2016  0.01)   History of recurrent UTIs    History of urinary retention    Neurogenic bladder    since prostatectomy 2003   Self-catheterizes urinary bladder    SECONDARY TO NEUROGENIC BLADDER    Urinary retention    Wears glasses    Past Surgical History:  Past Surgical History:  Procedure Laterality Date   CATARACT EXTRACTION W/ INTRAOCULAR LENS  IMPLANT, BILATERAL  2010   CYSTO/  DILATATION BLADDER NECK CONTRACTURE  06/02/2002   CYSTOSCOPY W/ RETROGRADES  07/21/2016   Procedure: CYSTOSCOPY WITH RETROGRADE PYELOGRAM, CLOT EVACUATION, BLADDER BIOPSY WITH FULGERATION;  Surgeon: Festus Aloe, MD;  Location: [place];  Service: Urology;;  0.5 TO 2 CM    EYE SURGERY Right yrs ago   PROSTATE SURGERY  yrs  ago   [place] /  REPAIR RECTAL PERFORATION  03-21-2002  dr Jori Moll davis   TRANSURETHRAL RESECTION OF PROSTATE  09/06/2001   HPI:  Pt is an 89 yo male admitted s/Trevor Phillips unwitnessed fall in his ALF with L side weakness noted. 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  PMH: previous CVAs. prostate CA with previous long standing foley in past, dementia.   Assessment / Plan / Recommendation Clinical Impression  Trevor Phillips participated in speech/language assessment - he has baseline dementia, impacting memory and attention, and today presented with deficits that included impaired selective attention, problem-solving, and awareness. Speech was fluent and clear, without dysarthria, and expressive and receptive language are [facility].  Pt will likely need a higher level of care at D/C and will benefit from rehab at [facility].    SLP Assessment  SLP Recommendation/Assessment: Patient needs continued Speech [place] [facility] Services SLP Visit Diagnosis: Cognitive communication deficit (R41.841)    Recommendations for follow up therapy are one component of a multi-disciplinary discharge planning process, led by the attending physician.  Recommendations may be updated based on patient status, additional functional criteria and insurance authorization.    Follow Up Recommendations  Skilled nursing-short term rehab (<3 hours/day)    Assistance Recommended at Discharge  Frequent or constant Supervision/Assistance  Functional Status Assessment Patient has had a recent decline in their functional status and demonstrates  the ability to make significant improvements in function in a reasonable and predictable amount of time.  Frequency and Duration min 2x/week  2 weeks      SLP Evaluation Cognition  Overall Cognitive Status: History of cognitive impairments - at baseline Arousal/Alertness: Awake/alert Orientation Level: Oriented to person;Oriented to place;Disoriented to situation;Disoriented to time Attention:  Sustained Sustained Attention: Appears intact Memory: Impaired Memory Impairment: Storage deficit Awareness: Impaired Awareness Impairment: Intellectual impairment Problem Solving: Impaired Safety/Judgment: Impaired       Comprehension  Auditory Comprehension Overall Auditory Comprehension: Appears within functional limits for tasks assessed Visual Recognition/Discrimination Discrimination: Within Function Limits Reading Comprehension Reading Status: Not tested    Expression Expression Primary Mode of Expression: Verbal Verbal Expression Overall Verbal Expression: Appears within functional limits for tasks assessed Written Expression Dominant Hand: Right   Oral / Motor  Oral Motor/Sensory Function Overall Oral Motor/Sensory Function: Mild impairment Facial Symmetry: Abnormal symmetry left;Suspected CN VII (facial) dysfunction Lingual Symmetry: Abnormal symmetry left;Suspected CN XII (hypoglossal) dysfunction Motor Speech Overall Motor Speech: Appears within functional limits for tasks assessed            [name] Laurice 11/27/2022, 12:14 PM Kaylla Cobos L. Tivis Ringer, MA CCC/SLP Clinical Specialist - [place] Office number [phone]

## 2022-11-27 NOTE — Progress Notes (Signed)
Patient alert this morning and able to recall conversation from me telling him where he was and why.

## 2022-11-27 NOTE — Care Management Important Message (Signed)
Important Message  Patient Details  Name: Trevor Phillips MRN: [id number] Date of Birth: 1930-12-22   Medicare Important Message Given:  Yes     Orbie Pyo 11/27/2022, 2:55 PM

## 2022-11-27 NOTE — Progress Notes (Signed)
Physical Therapy Treatment Patient Details Name: Trevor Phillips MRN: [id number] DOB: [birth year] Today's Date: 11/27/2022   History of Present Illness Pt is an 89 yo male admitted s/p unwitnessed fall in his ALF with L side weakness noted. 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  PMH: previous CVAs. prostate CA with previous long standing foley in past, dementia.    PT Comments    Pt is demonstrating a decline in functional mobility since initial evaluation. Pt requires Mod A for supine>sitting, Max A +2 for sitting to supine, Max A for 2 time sit to stand at EOB and unable to use L hand to grasp RW. Heavy posterior and R lean that pt is unable to correct but able to identify when asked. Pt required intermittent physical assist to stay sitting EOB otherwise very close SBA due to sudden posterior LOB and LE off floor increasing risk for sliding off EOB. Due to pt current functional status, PLOF< available assistance on discharge and home environment recommending skilled physical therapy services in SNF setting on discharge from acute care hospital setting in order to decrease risk for falls, immobility, injury and re-hospitalization.    Recommendations for follow up therapy are one component of a multi-disciplinary discharge planning process, led by the attending physician.  Recommendations may be updated based on patient status, additional functional criteria and insurance authorization.  Follow Up Recommendations  Skilled nursing-short term rehab (<3 hours/day) Can patient physically be transported by private vehicle: No   Assistance Recommended at Discharge Frequent or constant Supervision/Assistance  Patient can return home with the following A lot of help with walking and/or transfers;Assistance with cooking/housework;Assist for transportation;Help with stairs or ramp for entrance   Equipment Recommendations  Other (comment) (defer to post acute)    Recommendations for Other Services        Precautions / Restrictions Precautions Precautions: Fall Precaution Comments: signficant memory deficits Restrictions Weight Bearing Restrictions: No     Mobility  Bed Mobility Overal bed mobility: Needs Assistance Bed Mobility: Supine to Sit, Sit to Supine     Supine to sit: Mod assist, Max assist Sit to supine: Max assist, +2 for physical assistance   General bed mobility comments: Mod A at the trunk to get to sitting. Max A to maintain forward trunk flexion to prevent falling posteriorly. Pt then does not keep feet on floor and needs knees blocked to prevent sliding off EOB to floor. Patient Response: Cooperative, Flat affect  Transfers Overall transfer level: Needs assistance Equipment used: Rolling walker (2 wheels) Transfers: Sit to/from Stand Sit to Stand: Max assist           General transfer comment: Pt unable to keep L hand on walker, Pt unable to progress steps, heavy R lean, unsafe to progress transfer at this time.    Ambulation/Gait   General Gait Details: unsafe to progress at this time.     Modified Rankin (Stroke Patients Only) Modified Rankin (Stroke Patients Only) Pre-Morbid Rankin Score: No significant disability Modified Rankin: Severe disability     Balance Overall balance assessment: Needs assistance Sitting-balance support: Single extremity supported, No upper extremity supported Sitting balance-Leahy Scale: Poor Sitting balance - Comments: Pt leaning to the R and difficulty maintaining forward flexion sitting EOB. Pt is able to state that he is leaning R. Unable to compensate once starting to lean back and needs knees blocked to prevent sliding off the bed. Postural control: Posterior lean, Right lateral lean Standing balance support: Bilateral upper extremity supported,  Reliant on assistive device for balance Standing balance-Leahy Scale: Poor Standing balance comment: Max A to remain standing. Heavy R lean, unable to keep LUE on the  RW        Cognition Arousal/Alertness: Awake/alert Behavior During Therapy: [facility] for tasks assessed/performed Overall Cognitive Status: History of cognitive impairments - at baseline         General Comments: very poor memory, disoriented to place time and situation, reduced awareness of deficits. Increased time to perform activities and follow instruction           General Comments General comments (skin integrity, edema, etc.): Pt is demonstrating decreased functional mobility from last physical therapy session requiring increased assistance.      Pertinent Vitals/Pain Pain Assessment Pain Assessment: No/denies pain     PT Goals (current goals can now be found in the care plan section) Acute Rehab PT Goals Patient Stated Goal: to return to ambulation PT Goal Formulation: With patient Time For Goal Achievement: 12/09/22 Potential to Achieve Goals: Fair Progress towards PT goals: Not progressing toward goals - comment (Pt has declined in functional mobility since last physical therapy session)    Frequency    Min 3X/week      PT Plan Current plan remains appropriate       AM-PAC PT "6 Clicks" Mobility   Outcome Measure  Help needed turning from your back to your side while in a flat bed without using bedrails?: A Lot Help needed moving from lying on your back to sitting on the side of a flat bed without using bedrails?: A Lot Help needed moving to and from a bed to a chair (including a wheelchair)?: A Lot Help needed standing up from a chair using your arms (e.g., wheelchair or bedside chair)?: A Lot Help needed to walk in hospital room?: Total Help needed climbing 3-5 steps with a railing? : Total 6 Click Score: 10    End of Session Equipment Utilized During Treatment: Gait belt Activity Tolerance: Patient tolerated treatment well Patient left: in bed;with call bell/phone within reach;with nursing/sitter in room Nurse Communication: Mobility status PT  Visit Diagnosis: Other abnormalities of gait and mobility (R26.89);Muscle weakness (generalized) (M62.81)     Time: 2992-4268 PT Time Calculation (min) (ACUTE ONLY): 28 min  Charges:  $Therapeutic Activity: 23-37 mins                    Tomma Rakers, DPT, CLT  Acute Rehabilitation Services Office: [phone] (Secure chat preferred)    Ander Purpura 11/27/2022, 3:50 PM

## 2022-11-27 NOTE — TOC Progression Note (Signed)
Transition of Care [facility]) - Progression Note    Patient Details  Name: Trevor Phillips MRN: [id number] Date of Birth: 1931-10-09  Transition [facility]) CM/SW Contact  Jinger Neighbors, [place] Phone Number: 11/27/2022, 3:05 PM  Clinical Narrative:     ..re: Bertram Savin  Date of birth: 27-Apr-1931 Date: 11/27/2022  TO WHOM IT MAY CONCERN:  Please be advised that the above named patient will require a short term nursing home stay, anticipated 30 days or less for rehabilitation and strengthening. The plan is for return home.    Expected Discharge Plan: Skilled Nursing Facility Barriers to Discharge: SNF Pending bed offer  Expected Discharge Plan and Services                                               Social Determinants of Health (SDOH) Interventions SDOH Screenings   Food Insecurity: No Food Insecurity (11/26/2022)  Housing: Low Risk  (11/26/2022)  Transportation Needs: No Transportation Needs (11/26/2022)  Utilities: Not At Risk (11/26/2022)  Tobacco Use: Medium Risk (11/24/2022)    Readmission Risk Interventions     No data to display

## 2022-11-27 NOTE — Plan of Care (Signed)
  Problem: Education: Goal: Knowledge of disease or condition will improve Outcome: Progressing Goal: Knowledge of secondary prevention will improve (MUST DOCUMENT ALL) Outcome: Progressing Goal: Knowledge of patient specific risk factors will improve Elta [place] N/A or DELETE if not current risk factor) Outcome: Progressing   Problem: Ischemic Stroke/TIA Tissue Perfusion: Goal: Complications of ischemic stroke/TIA will be minimized Outcome: Progressing   Problem: Coping: Goal: Will verbalize positive feelings about self Outcome: Progressing Goal: Will identify appropriate support needs Outcome: Progressing   Problem: Health Behavior/Discharge Planning: Goal: Ability to manage health-related needs will improve Outcome: Progressing Goal: Goals will be collaboratively established with patient/family Outcome: Progressing   Problem: Self-Care: Goal: Ability to participate in self-care as condition permits will improve Outcome: Progressing Goal: Verbalization of feelings and concerns over difficulty with self-care will improve Outcome: Progressing Goal: Ability to communicate needs accurately will improve Outcome: Progressing   Problem: Nutrition: Goal: Risk of aspiration will decrease Outcome: Progressing Goal: Dietary intake will improve Outcome: Progressing   Problem: Safety: Goal: Non-violent Restraint(s) Outcome: Progressing   Problem: Education: Goal: Knowledge of General Education information will improve Description: Including pain rating scale, medication(s)/side effects and non-pharmacologic comfort measures Outcome: Progressing   Problem: Health Behavior/Discharge Planning: Goal: Ability to manage health-related needs will improve Outcome: Progressing   Problem: Clinical Measurements: Goal: Ability to maintain clinical measurements within normal limits will improve Outcome: Progressing Goal: Will remain free from infection Outcome: Progressing Goal: Diagnostic  test results will improve Outcome: Progressing Goal: Respiratory complications will improve Outcome: Progressing Goal: Cardiovascular complication will be avoided Outcome: Progressing   Problem: Activity: Goal: Risk for activity intolerance will decrease Outcome: Progressing   Problem: Nutrition: Goal: Adequate nutrition will be maintained Outcome: Progressing   Problem: Coping: Goal: Level of anxiety will decrease Outcome: Progressing   Problem: Elimination: Goal: Will not experience complications related to bowel motility Outcome: Progressing Goal: Will not experience complications related to urinary retention Outcome: Progressing   Problem: Pain Managment: Goal: General experience of comfort will improve Outcome: Progressing   Problem: Safety: Goal: Ability to remain free from injury will improve Outcome: Progressing   Problem: Skin Integrity: Goal: Risk for impaired skin integrity will decrease Outcome: Progressing

## 2022-11-27 NOTE — TOC Progression Note (Signed)
Transition of Care [facility]) - Progression Note    Patient Details  Name: DEMPSEY AHONEN MRN: [id number] Date of Birth: 12/13/1930  [facility]) CM/SW Contact  Jinger Neighbors, [place] Phone Number: 11/27/2022, 3:06 PM  Clinical Narrative:     RE: Dashun Borre Date of birth: 02-11-1931 Date: 11/27/2022  TO WHOM IT MAY CONCERN:  Please be advised that the above named patient has a primary diagnosis of Dementia, which supercedes any psychiatric diagnosis.    Expected Discharge Plan: Skilled Nursing Facility Barriers to Discharge: SNF Pending bed offer  Expected Discharge Plan and Services                                               Social Determinants of Health (SDOH) Interventions SDOH Screenings   Food Insecurity: No Food Insecurity (11/26/2022)  Housing: Low Risk  (11/26/2022)  Transportation Needs: No Transportation Needs (11/26/2022)  Utilities: Not At Risk (11/26/2022)  Tobacco Use: Medium Risk (11/24/2022)    Readmission Risk Interventions     No data to display

## 2022-11-28 DIAGNOSIS — I6381 Other cerebral infarction due to occlusion or stenosis of small artery: Secondary | ICD-10-CM | POA: Diagnosis not present

## 2022-11-28 NOTE — Progress Notes (Signed)
Mobility Specialist Progress Note:   11/28/22 0949  Mobility  Activity Dangled on edge of bed  Level of Assistance Dependent, patient does less than 25%  Assistive Device Other (Comment) (Physical Assist)  Activity Response Tolerated fair  Mobility Referral Yes  $Mobility charge 1 Mobility   Pt received in bed and agreeable. C/o fatigue at end of session. Pt required MaxA for bed mobility and to stay upright while seated. Pt returned to supine with all needs met, call bell in reach, and bed alarm on.   Andrey Campanile Mobility Specialist Please contact via SecureChat or  Rehab office at [phone]

## 2022-11-28 NOTE — Progress Notes (Signed)
Trevor Phillips  OZD:[id number] DOB: October 12, 1931 DOA: 11/24/2022 PCP: Lajean Manes, MD    Brief Narrative:  89 year old ALF resident with a history of dementia, prostate cancer status post radical, bladder neck contracture, diverticular disease, urinary retention, and recurrent UTI who was found down following an unwitnessed fall with left-sided weakness being appreciated afterwards.  In the ED CT head and cervical spine was negative for acute findings but MRI revealed an acute right pons CVA.  Consultants:  Urology Neurology  Goals of Care:  Code Status: Full Code   DVT prophylaxis: Lovenox  Interim Hx: No acute events documented overnight.  Blood pressure remains elevated with systolics 259-563.  The patient is calm and pleasant at the time of my exam.  He denies any new complaints.  He appears comfortable.  Assessment & Plan:  Acute CVA R pons  -Resultant left-sided weakness and facial droop -Revealed on MRI of brain -CTa head and neck reveals no LVO but severe stenosis in the distal right ICA, distal right A3, proximal left V4, and proximal left P2 with 75% stenosis in the proximal ICA bilaterally as well as disease of the left vertebral artery and right vertebral artery -TTE notes mitral valve prolapse which appears to be mild with a EF 75% and no WMA with no obvious intracardiac source of embolization and no intracardiac shunt - felt to be due to severe concurrent extracranial and intracranial anterior and posterior circulation occlusive atherosclerotic disease  -Daily ASA '81mg'$  plus Plavx for 3 months then aspirin 81 mg alone per Neurology -LDL 123 - continue Zetia - added Lipitor  -A1c 5.2 -PT/OT/SLP - suggestion is for SNF rehab -Allow for permissive hypertension  Elevated BP No prior hx of HTN - not on BP meds - follow w/o tx for now   Dysphagia Patient failed his initial stroke swallow screen -due to ongoing concerns of aspiration even with modified diet patient  underwent MBS and did poorly during this evaluation - he is not an appropriate candidate for tube feeding and therefore I have asked SLP to recommend the safest diet understanding that aspiration will continue to be a risk   Acute urinary retention Encountered during ED stay -history of prostate cancer status post radical prostatectomy with bladder neck contracture - Foley catheter placed by Urology and is to remain in place for 1 week - follow-up in the Urology clinic as outpt   Acute kidney injury Creatinine 1.32 at presentation - likely related to simple dehydration and possible urinary retention - creatinine improved with volume expansion  Dementia with acute delirium At baseline is reportedly oriented to person and place but not time - lives in ALF - avoid benzodiazepines - initiated nightly Seroquel - as needed Haldol during the day - appears to be stabilizing at his probable baseline mental status   Family Communication: No family present at time of exam Disposition: Will need SNF for rehab stay with patient and son agreeing to this -he is now medically stable for discharge to SNF   Objective: Blood pressure (!) 180/82, pulse 85, temperature 99.2 F (37.3 C), temperature source Oral, resp. rate 16, height '5\' 5"'$  (1.651 m), weight 60 kg, SpO2 95 %. No intake or output data in the 24 hours ending 11/28/22 0839  Filed Weights   11/26/22 1629  Weight: 60 kg    Examination: General: No acute respiratory distress Lungs: Clear to auscultation bilaterally  Cardiovascular: Regular rate and rhythm without murmur  Abdomen: Nontender, nondistended, soft, bowel sounds positive  Extremities: No significant edema bilateral lower extremities  CBC: Recent Labs  Lab 11/24/22 1535 11/26/22 0504  WBC 12.9* 11.3*  NEUTROABS 11.1*  --   HGB 14.6 14.3  HCT 43.2 42.5  MCV 95.8 96.2  PLT 212 254    Basic Metabolic Panel: Recent Labs  Lab 11/24/22 1535 11/26/22 0504  NA 138 138  K 4.3  3.7  CL 105 107  CO2 23 18*  GLUCOSE 98 95  BUN 29* 24*  CREATININE 1.32* 1.24  CALCIUM 9.6 9.3    GFR: Estimated Creatinine Clearance: 34.3 mL/min (by C-G formula based on SCr of 1.24 mg/dL).   Scheduled Meds:  aspirin  81 mg Oral Daily   atorvastatin  40 mg Oral Daily   Chlorhexidine Gluconate Cloth  6 each Topical Daily   clopidogrel  75 mg Oral Daily   donepezil  10 mg Oral QHS   enoxaparin (LOVENOX) injection  40 mg Subcutaneous Q24H   ezetimibe  10 mg Oral Daily   methenamine  500 mg Oral BID   QUEtiapine  25 mg Oral QHS      LOS: 3 days   Cherene Altes, MD [facility] Office  [phone] Pager - Text Page per Shea Evans  If 7PM-7AM, please contact night-coverage per Amion 11/28/2022, 8:39 AM

## 2022-11-29 DIAGNOSIS — I6381 Other cerebral infarction due to occlusion or stenosis of small artery: Secondary | ICD-10-CM | POA: Diagnosis not present

## 2022-11-29 MED ORDER — CARVEDILOL 3.125 MG PO TABS
3.1250 mg | ORAL_TABLET | Freq: Two times a day (BID) | ORAL | Status: DC
  Administered 2022-11-29 – 2022-12-01 (×5): 3.125 mg via ORAL
  Filled 2022-11-29 (×5): qty 1

## 2022-11-29 NOTE — Progress Notes (Signed)
Trevor Phillips  MIW:[id number] DOB: 20-Jul-1931 DOA: 11/24/2022 PCP: Lajean Manes, MD    Brief Narrative:  89 year old ALF resident with a history of dementia, prostate cancer status post radical prostatectomy, bladder neck contracture, diverticular disease, urinary retention, and recurrent UTI who was found down following an unwitnessed fall with left-sided weakness being appreciated afterwards.  In the ED CT head and cervical spine was negative for acute findings but MRI revealed an acute right pons CVA.  Consultants:  Urology Neurology  Goals of Care:  Code Status: Full Code   DVT prophylaxis: Lovenox  Interim Hx: Afebrile.  Blood pressure remains elevated with systolics 250-037.  Vitals otherwise stable.  Resting comfortably in his bed.  Alert conversant and calm.  No new complaints.  Assessment & Plan:  Acute CVA R pons  -Resultant left-sided weakness and facial droop -Revealed on MRI of brain -CTa head and neck reveals no LVO but severe stenosis in the distal right ICA, distal right A3, proximal left V4, and proximal left P2 with 75% stenosis in the proximal ICA bilaterally as well as disease of the left vertebral artery and right vertebral artery -TTE notes mitral valve prolapse which appears to be mild with a EF 75% and no WMA with no obvious intracardiac source of embolization and no intracardiac shunt - felt to be due to severe concurrent extracranial and intracranial anterior and posterior circulation occlusive atherosclerotic disease  -Daily ASA '81mg'$  plus Plavx for 3 months then aspirin 81 mg alone per Neurology -LDL 123 - continue Zetia - added Lipitor  -A1c 5.2 -PT/OT/SLP - suggestion is for SNF rehab -Begin to slowly adjust blood pressure therapy at this time  Elevated BP No prior hx of HTN - not on BP meds -begin to slowly adjust blood pressure therapy at this time  Dysphagia Patient failed his initial stroke swallow screen -due to ongoing concerns of aspiration  even with modified diet patient underwent MBS and did poorly during this evaluation - he is not an appropriate candidate for tube feeding and therefore I have asked SLP to recommend the safest diet understanding that aspiration will continue to be a risk   Acute urinary retention Encountered during ED stay -history of prostate cancer status post radical prostatectomy with bladder neck contracture - Foley catheter placed by Urology and is to remain in place for 1 week - follow-up in the Urology clinic as outpt   Acute kidney injury Creatinine 1.32 at presentation - likely related to simple dehydration and possible urinary retention - creatinine improved with volume expansion  Dementia with acute delirium At baseline is reportedly oriented to person and place but not time - lives in ALF - avoid benzodiazepines - initiated nightly Seroquel - as needed Haldol during the day - appears to be stabilizing at his probable baseline mental status   Family Communication: No family present at time of exam Disposition: Will need SNF for rehab stay with patient and son agreeing to this -he is now medically stable for discharge to SNF   Objective: Blood pressure (!) 173/77, pulse 76, temperature 98.2 F (36.8 C), resp. rate 16, height '5\' 5"'$  (1.651 m), weight 60 kg, SpO2 93 %.  Intake/Output Summary (Last 24 hours) at 11/29/2022 0488 Last data filed at 11/29/2022 0500 Gross per 24 hour  Intake --  Output 500 ml  Net -500 ml    Filed Weights   11/26/22 1629  Weight: 60 kg    Examination: General: No acute respiratory distress Lungs: Clear  to auscultation bilaterally  Cardiovascular: Regular rate and rhythm Abdomen: Nontender, nondistended, soft, bowel sounds positive Extremities: No significant edema bilateral lower extremities  CBC: Recent Labs  Lab 11/24/22 1535 11/26/22 0504  WBC 12.9* 11.3*  NEUTROABS 11.1*  --   HGB 14.6 14.3  HCT 43.2 42.5  MCV 95.8 96.2  PLT 212 202    Basic  Metabolic Panel: Recent Labs  Lab 11/24/22 1535 11/26/22 0504  NA 138 138  K 4.3 3.7  CL 105 107  CO2 23 18*  GLUCOSE 98 95  BUN 29* 24*  CREATININE 1.32* 1.24  CALCIUM 9.6 9.3    GFR: Estimated Creatinine Clearance: 34.3 mL/min (by C-G formula based on SCr of 1.24 mg/dL).   Scheduled Meds:  aspirin  81 mg Oral Daily   atorvastatin  40 mg Oral Daily   Chlorhexidine Gluconate Cloth  6 each Topical Daily   clopidogrel  75 mg Oral Daily   donepezil  10 mg Oral QHS   enoxaparin (LOVENOX) injection  40 mg Subcutaneous Q24H   ezetimibe  10 mg Oral Daily   methenamine  500 mg Oral BID   QUEtiapine  25 mg Oral QHS      LOS: 4 days   Cherene Altes, MD [facility] Office  [phone] Pager - Text Page per Shea Evans  If 7PM-7AM, please contact night-coverage per Amion 11/29/2022, 9:22 AM

## 2022-11-30 DIAGNOSIS — I6381 Other cerebral infarction due to occlusion or stenosis of small artery: Secondary | ICD-10-CM | POA: Diagnosis not present

## 2022-11-30 NOTE — Progress Notes (Signed)
Occupational Therapy Treatment Patient Details Name: Trevor Phillips MRN: [id number] DOB: October 07, 1931 Today's Date: 11/30/2022   History of present illness Pt is an 89 yo male admitted s/p unwitnessed fall in his ALF with L side weakness noted. 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  PMH: previous CVAs. prostate CA with previous long standing foley in past, dementia.   OT comments  It appears pt functional mobility and L side movement has declined since evaluation in the ER.  Pt with more weakness in LUE during adls this session with  2/5 strength, at most, in L arm and pt is not using it functionally.  Spent the session addressing feeding using RUE to feed self. Pt able to use RUE to feed self but needs strict supervision and min assist due to significant pocketing, taking large bites and sips and noted wetness and gurgling during feeding.  Pt cued to clear throat often and cued to take small bites and sips when eating.  Pt with pocketing in the L side of mouth and is able to clear this food with min assist and min cues.  Pt fed self 25% of meal before becoming tearful about the amount of help he needs now compared to before.  Feel pt ha some insight into his deficits and is having a normal response to it.   Recommendations for follow up therapy are one component of a multi-disciplinary discharge planning process, led by the attending physician.  Recommendations may be updated based on patient status, additional functional criteria and insurance authorization.    Follow Up Recommendations  Skilled nursing-short term rehab (<3 hours/day)     Assistance Recommended at Discharge Frequent or constant Supervision/Assistance  Patient can return home with the following  A lot of help with walking and/or transfers;A lot of help with bathing/dressing/bathroom;Assistance with cooking/housework;Assistance with feeding;Direct supervision/assist for medications management;Direct supervision/assist for financial  management;Assist for transportation;Help with stairs or ramp for entrance   Equipment Recommendations       Recommendations for Other Services      Precautions / Restrictions Precautions Precautions: Fall Precaution Comments: signficant memory deficits Restrictions Weight Bearing Restrictions: No       Mobility Bed Mobility                    Transfers                         Balance                                           ADL either performed or assessed with clinical judgement   ADL Overall ADL's : Needs assistance/impaired Eating/Feeding: Moderate assistance;Sitting Eating/Feeding Details (indicate cue type and reason): Worked with pt for feeding session. Pt sat at 80 degrees to feed self. Pt instructed to flip fork upsidedown and "stab" food with fork. Pt had easier time keeping food on fork this way as it fell off often when pt scooped food. Pt with significant pocketing on L side whe eating.  Instructed pt to take tongue to clear L side out before putting more food in. Pt would not take finger into mouth to clear food.  Pt required moderate cues to take one bite at a time and one sip at a time when drinking. Pt with delayed swallow and did sound gurgly after drinking nectar  thick liquids.  Will talk to [place]. Grooming: Wash/dry hands;Wash/dry face Grooming Details (indicate cue type and reason): Pt with decreased use of LUE compared to eval.                               General ADL Comments: Pt with increased L side weakness. Unable to use L side functionally.  Addressed feeding and precautions with feeding today.  Feel pt has some insight into deficits as pt very tearful today about his loss of function.    Extremity/Trunk Assessment Upper Extremity Assessment Upper Extremity Assessment: LUE deficits/detail LUE Deficits / Details: PROM WFL Strength  shoulder 1/5, biceps/triceps 2/5, grip 2/5, finger extension 1/5 LUE  Sensation: decreased light touch LUE Coordination: decreased fine motor;decreased gross motor   Lower Extremity Assessment Lower Extremity Assessment: Defer to PT evaluation        Vision       Perception Perception Perception: Not tested   Praxis      Cognition Arousal/Alertness: Awake/alert Behavior During Therapy: WFL for tasks assessed/performed Overall Cognitive Status: History of cognitive impairments - at baseline                                 General Comments: very poor memory, disoriented to place time and situation, reduced awareness of deficits. Increased time to perform activities and follow instruction. Pt tearful today talking about going into ALF bc family could not tend to him anymore.  Pt with some awaress of his change in status and is upset about it.        Exercises      Shoulder Instructions       General Comments Focused on feeding during OT session today.    Pertinent Vitals/ Pain       Pain Assessment Pain Assessment: No/denies pain  Home Living                                          Prior Functioning/Environment              Frequency  Min 2X/week        Progress Toward Goals  OT Goals(current goals can now be found in the care plan section)  Progress towards OT goals: Progressing toward goals  Acute Rehab OT Goals Patient Stated Goal: none stated OT Goal Formulation: With family Time For Goal Achievement: 12/09/22 Potential to Achieve Goals: Fair ADL Goals Pt Will Perform Eating: with supervision;sitting Pt Will Perform Grooming: with supervision;standing Pt Will Perform Upper Body Dressing: with supervision;sitting Pt Will Perform Lower Body Dressing: with min guard assist;sit to/from stand Pt Will Transfer to Toilet: with min assist;ambulating;bedside commode;regular height toilet Pt Will Perform Toileting - Clothing Manipulation and hygiene: with supervision;sitting/lateral  leans Additional ADL Goal #1: Pt will use LUE as assist during all adls without cues.  Plan Discharge plan remains appropriate    Co-evaluation                 AM-PAC OT "6 Clicks" Daily Activity     Outcome Measure   Help from another person eating meals?: A Lot Help from another person taking care of personal grooming?: A Lot Help from another person toileting, which includes using toliet, bedpan, or urinal?: A Lot Help  from another person bathing (including washing, rinsing, drying)?: A Lot Help from another person to put on and taking off regular upper body clothing?: A Lot Help from another person to put on and taking off regular lower body clothing?: A Lot 6 Click Score: 12    End of Session    OT Visit Diagnosis: Unsteadiness on feet (R26.81);Muscle weakness (generalized) (M62.81);Other abnormalities of gait and mobility (R26.89);Hemiplegia and hemiparesis Hemiplegia - Right/Left: Left Hemiplegia - dominant/non-dominant: Non-Dominant Hemiplegia - caused by: Cerebral infarction   Activity Tolerance Patient tolerated treatment well   Patient Left in bed;with call bell/phone within reach   Nurse Communication Mobility status;Other (comment) (need for supervision and assist to feed self.)        Time: 4010-2725 OT Time Calculation (min): 29 min  Charges: OT General Charges $OT Visit: 1 Visit OT Treatments $Self Care/Home Management : 23-37 mins   Glenford Peers 11/30/2022, 1:06 PM

## 2022-11-30 NOTE — TOC Progression Note (Signed)
Transition of Care [facility]) - Progression Note    Patient Details  Name: Trevor Phillips MRN: [id number] Date of Birth: 28-Aug-1931  [facility]) CM/SW Cable, [place] Phone Number: 11/30/2022, 10:10 AM  Clinical Narrative:   CSW noting no bed offers per chart review. CSW faxed out referral again, awaiting responses. CSW to follow.    Expected Discharge Plan: Skilled Nursing Facility Barriers to Discharge: SNF Pending bed offer  Expected Discharge Plan and Services                                               Social Determinants of Health (SDOH) Interventions SDOH Screenings   Food Insecurity: No Food Insecurity (11/26/2022)  Housing: Low Risk  (11/26/2022)  Transportation Needs: No Transportation Needs (11/26/2022)  Utilities: Not At Risk (11/26/2022)  Tobacco Use: Medium Risk (11/24/2022)    Readmission Risk Interventions     No data to display

## 2022-11-30 NOTE — Progress Notes (Signed)
Speech Language Pathology Treatment: Dysphagia  Patient Details Name: Trevor Phillips MRN: [id number] DOB: 07-02-1931 Today's Date: 11/30/2022 Time: 8563-1497 SLP Time Calculation (min) (ACUTE ONLY): 24 min  Assessment / Plan / Recommendation Clinical Impression  Pt seen for dysphagia tx. His son, Trevor Phillips, was at the bedside. We reviewed imaging of MBS from 1/19 and discussed results, including aspiration of materials often after the swallow due to residue in throat, as well as weak cough that does not offer airway protection.  Trevor Phillips agrees to continue an oral diet with efforts made to minimize aspiration (through diet modification and precautions) - he understands that we cannot prevent aspiration and that it may lead to adverse consequences (pna, death). He states his father would not want a feeding tube and is going to look for his living will.    [name] tolerated POs better this afternoon than he did earlier today with improved oral attention and no overt s/s of aspiration. He will benefit from dysphagia therapy at SNF.  SLP will follow pending D/C.   HPI HPI: Pt is an 89 yo male admitted s/p unwitnessed fall in his ALF with L side weakness noted. Pt found to have R pontine CVA.  PMH: previous CVAs. prostate CA with previous long standing foley in past, dementia. His wife died approximately five months ago per his son.      SLP Plan  Continue with current plan of care      Recommendations for follow up therapy are one component of a multi-disciplinary discharge planning process, led by the attending physician.  Recommendations may be updated based on patient status, additional functional criteria and insurance authorization.    Recommendations  Diet recommendations: Dysphagia 3 (mechanical soft);Nectar-thick liquid Liquids provided via: Cup Medication Administration: Crushed with puree Supervision: Staff to assist with self feeding;Full supervision/cueing for compensatory  strategies Compensations: Slow rate;Small sips/bites;Minimize environmental distractions                Oral Care Recommendations: Oral care BID Follow Up Recommendations: Skilled nursing-short term rehab (<3 hours/day) Assistance recommended at discharge: Frequent or constant Supervision/Assistance SLP Visit Diagnosis: Dysphagia, oropharyngeal phase (R13.12) Plan: Continue with current plan of care          Trevor Mario L. Tivis Ringer, MA CCC/SLP Clinical Specialist - Acute Care SLP Acute Rehabilitation Services Office number [phone]  Trevor Phillips  11/30/2022, 4:29 PM

## 2022-11-30 NOTE — Progress Notes (Signed)
Physical Therapy Treatment Patient Details Name: Trevor Phillips MRN: [id number] DOB: 1930/12/24 Today's Date: 11/30/2022   History of Present Illness Pt is an 89 yo male admitted s/p unwitnessed fall in his ALF with L side weakness noted. 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  PMH: previous CVAs. prostate CA with previous long standing foley in past, dementia.    PT Comments    Pt greeted semi-reclined in bed and agreeable to session with incremental progress this date.  Pt able to initiate BLEs toward EOB with assist needed to complete and elevate trunk. Pt with strong R lateral lean initially when coming to sit however improving throughout session with pt able to maintain midline upright sitting without UE support at end of session ~5 mins. Pt able to transfer sit<>stand with mod-max assist in stedy frame with pt needing max cues and facilitation for upright trunk and weight shifting to L. Pt with some tearful moments throughout session when speaking about his late wife, provided therapeutic listening. Current plan remains appropriate to address deficits and maximize functional independence and decrease caregiver burden. Pt continues to benefit from skilled PT services to progress toward functional mobility goals.     Recommendations for follow up therapy are one component of a multi-disciplinary discharge planning process, led by the attending physician.  Recommendations may be updated based on patient status, additional functional criteria and insurance authorization.  Follow Up Recommendations  Skilled nursing-short term rehab (<3 hours/day) Can patient physically be transported by private vehicle: No   Assistance Recommended at Discharge Frequent or constant Supervision/Assistance  Patient can return home with the following A lot of help with walking and/or transfers;Assistance with cooking/housework;Assist for transportation;Help with stairs or ramp for entrance   Equipment Recommendations   Other (comment) (defer to post acute)    Recommendations for Other Services       Precautions / Restrictions Precautions Precautions: Fall Precaution Comments: signficant memory deficits Restrictions Weight Bearing Restrictions: No     Mobility  Bed Mobility Overal bed mobility: Needs Assistance Bed Mobility: Supine to Sit, Sit to Supine     Supine to sit: Mod assist Sit to supine: Mod assist   General bed mobility comments: mod a to elevate trunk and scoot out to EOB, assist to return BLEs to bed    Transfers Overall transfer level: Needs assistance Equipment used: Ambulation equipment used Transfers: Sit to/from Stand Sit to Stand: Mod assist, Max assist           General transfer comment: max a to stand from EOB x2, mod a to stand from stedy seat, max cues to achieve upright trunk    Ambulation/Gait               General Gait Details: unable   Stairs             Wheelchair Mobility    Modified Rankin (Stroke Patients Only)       Balance Overall balance assessment: Needs assistance Sitting-balance support: Single extremity supported, No upper extremity supported Sitting balance-Leahy Scale: Fair Sitting balance - Comments: pt lean R inititally, after standing pt able to maintain without assist Postural control: Right lateral lean Standing balance support: Bilateral upper extremity supported Standing balance-Leahy Scale: Poor                              Cognition Arousal/Alertness: Awake/alert Behavior During Therapy: WFL for tasks assessed/performed Overall Cognitive Status: History of  cognitive impairments - at baseline                                 General Comments: very poor memory, disoriented to place time and situation, reduced awareness of deficits. Increased time to perform activities and follow instruction. Pt with some awaress of his change in status and is upset about it.        Exercises       General Comments General comments (skin integrity, edema, etc.): Focused on feeding during OT session today.      Pertinent Vitals/Pain      Home Living                          Prior Function            PT Goals (current goals can now be found in the care plan section) Acute Rehab PT Goals PT Goal Formulation: With patient Time For Goal Achievement: 12/09/22 Progress towards PT goals: Progressing toward goals    Frequency    Min 3X/week      PT Plan Current plan remains appropriate    Co-evaluation              AM-PAC PT "6 Clicks" Mobility   Outcome Measure  Help needed turning from your back to your side while in a flat bed without using bedrails?: A Lot Help needed moving from lying on your back to sitting on the side of a flat bed without using bedrails?: A Lot Help needed moving to and from a bed to a chair (including a wheelchair)?: A Lot Help needed standing up from a chair using your arms (e.g., wheelchair or bedside chair)?: Total Help needed to walk in hospital room?: Total Help needed climbing 3-5 steps with a railing? : Total 6 Click Score: 9    End of Session   Activity Tolerance: Patient tolerated treatment well Patient left: in bed;with call bell/phone within reach;with bed alarm set Nurse Communication: Mobility status PT Visit Diagnosis: Other abnormalities of gait and mobility (R26.89);Muscle weakness (generalized) (M62.81)     Time: 7530-0511 PT Time Calculation (min) (ACUTE ONLY): 27 min  Charges:  $Therapeutic Activity: 23-37 mins                     Ariabella Brien R. PTA Acute Rehabilitation Services Office: [place] 11/30/2022, 2:47 PM

## 2022-11-30 NOTE — Progress Notes (Signed)
Trevor Phillips  [name]:[id number] DOB: 1931/10/20 DOA: 11/24/2022 PCP: Lajean Manes, MD    Brief Narrative:  89 year old ALF resident with a history of dementia, prostate cancer status post radical prostatectomy, bladder neck contracture, diverticular disease, urinary retention, and recurrent UTI who was found down following an unwitnessed fall with left-sided weakness being appreciated afterwards.  In the ED CT head and cervical spine was negative for acute findings but MRI revealed an acute right pons CVA.  Consultants:  Urology Neurology  Goals of Care:  Code Status: Full Code   DVT prophylaxis: Lovenox  Interim Hx: No acute events reported overnight.  Afebrile.  Vital signs stable.  Patient alert conversant and pleasant today.  Denies any new complaints.  Assessment & Plan:  Acute CVA R pons  -Resultant left-sided weakness and facial droop -Revealed on MRI of brain -CTa head and neck reveals no LVO but severe stenosis in the distal right ICA, distal right A3, proximal left V4, and proximal left P2 with 75% stenosis in the proximal ICA bilaterally as well as disease of the left vertebral artery and right vertebral artery -TTE notes mitral valve prolapse which appears to be mild with a EF 75% and no WMA with no obvious intracardiac source of embolization and no intracardiac shunt - felt to be due to severe concurrent extracranial and intracranial anterior and posterior circulation occlusive atherosclerotic disease  -Daily ASA '81mg'$  plus Plavx for 3 months then aspirin 81 mg alone per Neurology -LDL 123 - continue Zetia - added Lipitor  -A1c 5.2 -PT/OT/SLP - suggestion is for SNF rehab  Elevated BP No prior hx of HTN - not on BP meds -begin to slowly adjust blood pressure therapy at this time but avoid overcorrection in this gentleman due to advanced age  Dysphagia Patient failed his initial stroke swallow screen -due to ongoing concerns of aspiration even with modified diet  patient underwent MBS and did poorly during this evaluation - he is not an appropriate candidate for tube feeding and therefore I asked SLP to recommend the safest diet understanding that aspiration will continue to be a risk   Acute urinary retention Encountered during ED stay -history of prostate cancer status post radical prostatectomy with bladder neck contracture - Foley catheter placed by Urology 11/24/2022 and is to remain in place for 1 week (will plan to d/c 12/01/22) - follow-up in the Urology clinic as outpt   Acute kidney injury Creatinine 1.32 at presentation - likely related to simple dehydration and possible urinary retention - creatinine improved with volume expansion  Dementia with acute delirium At baseline is reportedly oriented to person and place but not time - lives in ALF - avoid benzodiazepines - initiated nightly Seroquel - as needed Haldol during the day - appears to be stabilizing at his probable baseline mental status   Family Communication: No family present at time of exam Disposition: Will need SNF for rehab stay with patient and son agreeing to this -he is now medically stable for discharge to SNF   Objective: Blood pressure (!) 168/98, pulse 84, temperature 98.2 F (36.8 C), temperature source Oral, resp. rate 18, height '5\' 5"'$  (1.651 m), weight 60 kg, SpO2 94 %. No intake or output data in the 24 hours ending 11/30/22 0928  Filed Weights   11/26/22 1629  Weight: 60 kg    Examination: General: No acute respiratory distress Lungs: Clear to auscultation bilaterally -no wheezing Cardiovascular: Regular rate and rhythm Abdomen: Nontender, nondistended, soft, bowel sounds positive  Extremities: No significant edema bilateral lower extremities  CBC: Recent Labs  Lab 11/24/22 1535 11/26/22 0504  WBC 12.9* 11.3*  NEUTROABS 11.1*  --   HGB 14.6 14.3  HCT 43.2 42.5  MCV 95.8 96.2  PLT 212 536    Basic Metabolic Panel: Recent Labs  Lab 11/24/22 1535  11/26/22 0504  NA 138 138  K 4.3 3.7  CL 105 107  CO2 23 18*  GLUCOSE 98 95  BUN 29* 24*  CREATININE 1.32* 1.24  CALCIUM 9.6 9.3    GFR: Estimated Creatinine Clearance: 34.3 mL/min (by C-G formula based on SCr of 1.24 mg/dL).   Scheduled Meds:  aspirin  81 mg Oral Daily   atorvastatin  40 mg Oral Daily   carvedilol  3.125 mg Oral BID WC   Chlorhexidine Gluconate Cloth  6 each Topical Daily   clopidogrel  75 mg Oral Daily   donepezil  10 mg Oral QHS   enoxaparin (LOVENOX) injection  40 mg Subcutaneous Q24H   ezetimibe  10 mg Oral Daily   methenamine  500 mg Oral BID   QUEtiapine  25 mg Oral QHS     LOS: 5 days   Trevor Altes, MD [facility] Office  [phone] Pager - Text Page per Trevor Phillips  If 7PM-7AM, please contact night-coverage per Amion 11/30/2022, 9:28 AM

## 2022-12-01 DIAGNOSIS — I6381 Other cerebral infarction due to occlusion or stenosis of small artery: Secondary | ICD-10-CM | POA: Diagnosis not present

## 2022-12-01 LAB — BASIC METABOLIC PANEL
Anion gap: 13 (ref 5–15)
BUN: 50 mg/dL — ABNORMAL HIGH (ref 8–23)
CO2: 19 mmol/L — ABNORMAL LOW (ref 22–32)
Calcium: 9.4 mg/dL (ref 8.9–10.3)
Chloride: 109 mmol/L (ref 98–111)
Creatinine, Ser: 1.36 mg/dL — ABNORMAL HIGH (ref 0.61–1.24)
GFR, Estimated: 50 mL/min — ABNORMAL LOW (ref >60)
Glucose, Bld: 128 mg/dL — ABNORMAL HIGH (ref 70–99)
Potassium: 3.8 mmol/L (ref 3.5–5.1)
Sodium: 141 mmol/L (ref 135–145)

## 2022-12-01 LAB — CBC
HCT: 44.5 % (ref 39.0–52.0)
Hemoglobin: 14.7 g/dL (ref 13.0–17.0)
MCH: 31.8 pg (ref 26.0–34.0)
MCHC: 33 g/dL (ref 30.0–36.0)
MCV: 96.3 fL (ref 80.0–100.0)
Platelets: 303 10*3/uL (ref 150–400)
RBC: 4.62 MIL/uL (ref 4.22–5.81)
RDW: 13.2 % (ref 11.5–15.5)
WBC: 17.9 10*3/uL — ABNORMAL HIGH (ref 4.0–10.5)
nRBC: 0 % (ref 0.0–0.2)

## 2022-12-01 MED ORDER — SODIUM CHLORIDE 0.9 % IV BOLUS
500.0000 mL | Freq: Once | INTRAVENOUS | Status: AC
  Administered 2022-12-01: 500 mL via INTRAVENOUS

## 2022-12-01 MED ORDER — SODIUM CHLORIDE 0.45 % IV SOLN: INTRAVENOUS | Status: DC

## 2022-12-01 MED ORDER — CARVEDILOL 3.125 MG PO TABS: 3.1250 mg | ORAL_TABLET | Freq: Two times a day (BID) | ORAL | Status: DC

## 2022-12-01 MED ORDER — ATORVASTATIN CALCIUM 40 MG PO TABS: 40.0000 mg | ORAL_TABLET | Freq: Every day | ORAL | Status: DC

## 2022-12-01 MED ORDER — ACETAMINOPHEN 325 MG PO TABS: 650.0000 mg | ORAL_TABLET | ORAL | Status: DC | PRN

## 2022-12-01 MED ORDER — SODIUM CHLORIDE 0.9 % IV SOLN: INTRAVENOUS | Status: DC

## 2022-12-01 MED ORDER — CLOPIDOGREL BISULFATE 75 MG PO TABS: 75.0000 mg | ORAL_TABLET | Freq: Every day | ORAL | Status: DC

## 2022-12-01 MED ORDER — ASPIRIN 81 MG PO CHEW: 81.0000 mg | CHEWABLE_TABLET | Freq: Every day | ORAL | Status: DC

## 2022-12-01 MED ORDER — QUETIAPINE FUMARATE 25 MG PO TABS: 25.0000 mg | ORAL_TABLET | Freq: Every day | ORAL | Status: DC

## 2022-12-01 NOTE — TOC Transition Note (Signed)
Transition of Care [facility]) - CM/SW Discharge Note   Patient Details  Name: Trevor Phillips MRN: [id number] Date of Birth: 1931-08-09  [facility] CM/SW Contact:  Geralynn Ochs, LCSW Phone Number: 12/01/2022, 4:24 PM   Clinical Narrative:   CSW spoke with patient's son, Louie Casa, to discuss SNF offers. Louie Casa interested in [facility], but they do not have any beds available. Louie Casa chose [facility] as second choice. [facility] able to admit today, insurance authorization obtained. Randy in agreement. Transport arranged with [facility] for next available.  Nurse to call report to [phone], Room 302A.    Final next level of care: Skilled Nursing Facility Barriers to Discharge: Barriers Resolved   Patient Goals and CMS Choice CMS Medicare.gov Compare Post Acute Care list provided to:: Patient Represenative (must comment) (Pt's son, Mr. Bartmess) Choice offered to / list presented to : Adult Children  Discharge Placement                         Discharge Plan and Services Additional resources added to the After Visit Summary for                                       Social Determinants of Health (SDOH) Interventions SDOH Screenings   Food Insecurity: No Food Insecurity (11/26/2022)  Housing: Low Risk  (11/26/2022)  Transportation Needs: No Transportation Needs (11/26/2022)  Utilities: Not At Risk (11/26/2022)  Tobacco Use: Medium Risk (11/24/2022)     Readmission Risk Interventions     No data to display

## 2022-12-01 NOTE — Progress Notes (Signed)
Physical Therapy Treatment Patient Details Name: Trevor Phillips MRN: [id number] DOB: 1930/12/01 Today's Date: 12/01/2022   History of Present Illness Pt is an 89 yo male admitted s/p unwitnessed fall in his ALF with L side weakness noted. 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  PMH: previous CVAs. prostate CA with previous long standing foley in past, dementia.    PT Comments    Pt greeted up in chair on arrival and agreeable to session with incremental progress towards goals this date. Pt able to come to partial stand with RW x3 trials, further attempts/gait progression deferred 2/2 urinary incontinence and max pt fatigue. Pt continues to require significant assist for all OOB mobility, needing max a to come to partial stand and able to maintain ~10 seconds before needing to sit.  Pt agreeable to time up in chair at end of session. Current plan remains appropriate to address deficits and maximize functional independence and decrease caregiver burden. Pt continues to benefit from skilled PT services to progress toward functional mobility goals.     Recommendations for follow up therapy are one component of a multi-disciplinary discharge planning process, led by the attending physician.  Recommendations may be updated based on patient status, additional functional criteria and insurance authorization.  Follow Up Recommendations  Skilled nursing-short term rehab (<3 hours/day) Can patient physically be transported by private vehicle: No   Assistance Recommended at Discharge Frequent or constant Supervision/Assistance  Patient can return home with the following A lot of help with walking and/or transfers;Assistance with cooking/housework;Assist for transportation;Help with stairs or ramp for entrance   Equipment Recommendations  Other (comment) (defer to post acute)    Recommendations for Other Services       Precautions / Restrictions Precautions Precautions: Fall Precaution Comments: signficant  memory deficits Restrictions Weight Bearing Restrictions: No     Mobility  Bed Mobility Overal bed mobility: Needs Assistance             General bed mobility comments: pt up in recliner pre and post session    Transfers Overall transfer level: Needs assistance Equipment used: Rolling walker (2 wheels) Transfers: Sit to/from Stand Sit to Stand: Max assist           General transfer comment: max a to stand from chair x3, further attempts deferred 2/2 urinary inconntincence    Ambulation/Gait               General Gait Details: unable   Stairs             Wheelchair Mobility    Modified Rankin (Stroke Patients Only) Modified Rankin (Stroke Patients Only) Pre-Morbid Rankin Score: No significant disability Modified Rankin: Severe disability     Balance Overall balance assessment: Needs assistance Sitting-balance support: Single extremity supported, No upper extremity supported Sitting balance-Leahy Scale: Fair   Postural control: Right lateral lean Standing balance support: Bilateral upper extremity supported Standing balance-Leahy Scale: Poor Standing balance comment: Max A to remain standing. heavy posterior lean and anterior flexion of trunk                            Cognition Arousal/Alertness: Awake/alert Behavior During Therapy: WFL for tasks assessed/performed Overall Cognitive Status: History of cognitive impairments - at baseline                                 General Comments:  very poor memory, disoriented to place time and situation, reduced awareness of deficits. Increased time to perform activities and follow instruction. Pt with some awaress of his change in status and can be upset about it        Exercises General Exercises - Lower Extremity Long Arc Quad: AROM, AAROM, Right, Left, 5 reps, Seated Hip Flexion/Marching: AROM, AAROM, Right, Left, 10 reps, Seated    General Comments         Pertinent Vitals/Pain Pain Assessment Pain Assessment: No/denies pain    Home Living                          Prior Function            PT Goals (current goals can now be found in the care plan section) Acute Rehab PT Goals PT Goal Formulation: With patient Time For Goal Achievement: 12/09/22 Progress towards PT goals: Not progressing toward goals - comment (fatigue/weakness)    Frequency    Min 3X/week      PT Plan Current plan remains appropriate    Co-evaluation              AM-PAC PT "6 Clicks" Mobility   Outcome Measure  Help needed turning from your back to your side while in a flat bed without using bedrails?: A Lot Help needed moving from lying on your back to sitting on the side of a flat bed without using bedrails?: A Lot Help needed moving to and from a bed to a chair (including a wheelchair)?: Total Help needed standing up from a chair using your arms (e.g., wheelchair or bedside chair)?: Total Help needed to walk in hospital room?: Total Help needed climbing 3-5 steps with a railing? : Total 6 Click Score: 8    End of Session Equipment Utilized During Treatment: Gait belt Activity Tolerance: Patient tolerated treatment well;Patient limited by fatigue Patient left: with call bell/phone within reach;in chair;with chair alarm set Nurse Communication: Mobility status PT Visit Diagnosis: Other abnormalities of gait and mobility (R26.89);Muscle weakness (generalized) (M62.81)     Time: 6203-5597 PT Time Calculation (min) (ACUTE ONLY): 23 min  Charges:  $Therapeutic Activity: 23-37 mins                     Tiena Manansala R. PTA Acute Rehabilitation Services Office: [place] 12/01/2022, 12:38 PM

## 2022-12-01 NOTE — Progress Notes (Signed)
Mobility Specialist: Progress Note   12/01/22 1152  Mobility  Activity Transferred from bed to chair  Level of Assistance Maximum assist, patient does 25-49%  Assistive Device Stedy  Activity Response Tolerated well  Mobility Referral Yes  $Mobility charge 1 Mobility   Pt received in the bed and agreeable to mobility. ModA with bed mobility as well as to stand. Upon standing pt bowel incontinent requiring +2 assist with pericare. MaxA to maintain standing during pericare secondary to fatigue. Pt to the chair once finished with call bell and phone in his lap. Chair alarm is on.   Nara Visa Jeronda Don Mobility Specialist Please contact via SecureChat or Rehab office at [phone]

## 2022-12-01 NOTE — Progress Notes (Signed)
Mobility Specialist: Progress Note   12/01/22 1447  Mobility  Activity Transferred from chair to bed  Level of Assistance +2 (takes two people)  Assistive Device Stedy  Activity Response Tolerated fair  Mobility Referral Yes  $Mobility charge 1 Mobility   Pt received in the chair and requesting to go back to bed. Pt bowel incontinent and assisted with pericare with help from OT. No c/o throughout. MaxA to start but heavy modA with cues and physical assist for upright posture. Pt back to bed with call bell and phone at his side. Bed alarm is on.   [place] Kerrie Latour Mobility Specialist Please contact via SecureChat or Rehab office at [phone]

## 2022-12-01 NOTE — Discharge Summary (Signed)
DISCHARGE SUMMARY  Trevor Phillips  MR#: [id number]  DOB:August 27, 1931  Date of Admission: 11/24/2022 Date of Discharge: 12/01/2022  Attending Physician:Padme Arriaga Hennie Duos, MD  Patient's UVO:[id number], [name], MD  Consults: Urology Neurology  Disposition: D/C to SNF for rehab stay  Follow-up Appts:  Follow-up Information     Lajean Manes, MD Follow up.   Specialty: Internal Medicine Contact information: [address] [place] [address] [phone]                 Tests Needing Follow-up: -check BMET in 5 days to f/u renal function -push oral intake of liquids -monitor for recurrence of urinary retention  -monitor BP and HR on newly initiated coreg   Discharge Diagnoses: Acute CVA R pons - thrombotic  Elevated BP Dysphagia Acute urinary retention Acute kidney injury Dementia with acute delirium   Initial presentation: 89 year old ALF resident with a history of dementia, prostate cancer status post radical prostatectomy, bladder neck contracture, diverticular disease, urinary retention, and recurrent UTI who was found down following an unwitnessed fall with left-sided weakness being appreciated afterwards. In the ED CT head and cervical spine was negative for acute findings but MRI revealed an acute right pons CVA.   Hospital Course:  Acute CVA R pons  -Resultant left-sided weakness and facial droop -Revealed on MRI of brain -CTa head and neck reveals no LVO but severe stenosis in the distal right ICA, distal right A3, proximal left V4, and proximal left P2 with 75% stenosis in the proximal ICA bilaterally as well as disease of the left vertebral artery and right vertebral artery -TTE notes mitral valve prolapse which appears to be mild with a EF 75% and no WMA with no obvious intracardiac source of embolization and no intracardiac shunt - felt to be due to severe concurrent extracranial and intracranial anterior and posterior circulation  occlusive atherosclerotic disease  -Daily ASA '81mg'$  plus Plavx for 3 months then aspirin 81 mg alone per Neurology -LDL 123 - continue Zetia - added Lipitor  -A1c 5.2 -PT/OT/SLP - suggestion is for SNF rehab   Elevated BP No prior hx of HTN - not on BP meds at admission - have been slowly adjusting blood pressure therapy to avoid overcorrection in this gentleman due to advanced age and known blood vessel disease    Dysphagia Patient failed his initial stroke swallow screen - due to ongoing concerns of aspiration even with modified diet patient underwent MBS and did poorly during this evaluation - he is not an appropriate candidate for tube feeding and therefore I asked SLP to recommend the safest diet understanding that aspiration will continue to be a risk - family agrees with this approach    Acute urinary retention Encountered during ED stay - history of prostate cancer status post radical prostatectomy with bladder neck contracture - Foley catheter placed by Urology 11/24/2022 and remained in place for 1 week (d/c 12/01/22) - the patient has been able to urinate spontaneously s/p foley removal w/o difficulty  - follow-up in the Urology clinic as outpt as needed    Acute kidney injury Creatinine 1.32 at presentation - likely related to simple dehydration and possible urinary retention - creatinine climbing again in setting of poor intake - push oral intake - consider check of BUN/crt in 5 days - pt was supported w/ IVF bolus and 6 hours of IVF infusion just prior to d/c to correct dehydration    Dementia with acute delirium At baseline is reportedly  oriented to person and place but not time - lives in ALF - avoid benzodiazepines - initiated nightly Seroquel - as needed Haldol during the day - appears to be stabilized at his probable baseline mental status at the time of d/c       Allergies as of 12/01/2022   No Known Allergies      Medication List     STOP taking these medications     cetirizine 10 MG tablet Commonly known as: ZYRTEC       TAKE these medications    acetaminophen 325 MG tablet Commonly known as: TYLENOL Take 2 tablets (650 mg total) by mouth every 4 (four) hours as needed for mild pain (or temp > 37.5 C (99.5 F)).   aspirin 81 MG chewable tablet Chew 1 tablet (81 mg total) by mouth daily. Start taking on: December 02, 2022   atorvastatin 40 MG tablet Commonly known as: LIPITOR Take 1 tablet (40 mg total) by mouth daily. Start taking on: December 02, 2022   carvedilol 3.125 MG tablet Commonly known as: COREG Take 1 tablet (3.125 mg total) by mouth 2 (two) times daily with a meal.   clopidogrel 75 MG tablet Commonly known as: PLAVIX Take 1 tablet (75 mg total) by mouth daily. Start taking on: December 02, 2022   donepezil 10 MG tablet Commonly known as: ARICEPT Take 10 mg by mouth at bedtime.   ezetimibe 10 MG tablet Commonly known as: ZETIA Take 10 mg by mouth every evening.   ferrous sulfate 325 (65 FE) MG EC tablet Take 325 mg by mouth daily with breakfast.   methenamine 1 g tablet Commonly known as: HIPREX Take 0.5 g by mouth 2 (two) times daily with a meal.   QUEtiapine 25 MG tablet Commonly known as: SEROQUEL Take 1 tablet (25 mg total) by mouth at bedtime.   Vitamin D3 50 MCG (2000 UT) Tabs Take 2,000 Units by mouth daily.        Day of Discharge BP (!) 170/94 (BP Location: Left Arm)   Pulse 81   Temp 98.2 F (36.8 C) (Oral)   Resp 18   Ht '5\' 5"'$  (1.651 m)   Wt 60 kg   SpO2 92%   BMI 22.01 kg/m   Physical Exam: General: No acute respiratory distress Lungs: Clear to auscultation bilaterally without wheezes or crackles Cardiovascular: Regular rate and rhythm without murmur  Abdomen: Nontender, nondistended, soft, bowel sounds positive, no rebound Extremities: No significant cyanosis, clubbing, or edema bilateral lower extremities  Basic Metabolic Panel: Recent Labs  Lab 11/24/22 1535 11/26/22 0504  12/01/22 0754  NA 138 138 141  K 4.3 3.7 3.8  CL 105 107 109  CO2 23 18* 19*  GLUCOSE 98 95 128*  BUN 29* 24* 50*  CREATININE 1.32* 1.24 1.36*  CALCIUM 9.6 9.3 9.4    CBC: Recent Labs  Lab 11/24/22 1535 11/26/22 0504 12/01/22 0754  WBC 12.9* 11.3* 17.9*  NEUTROABS 11.1*  --   --   HGB 14.6 14.3 14.7  HCT 43.2 42.5 44.5  MCV 95.8 96.2 96.3  PLT 212 202 303    Time spent in discharge (includes decision making & examination of pt): 35 minutes  12/01/2022, 2:27 PM   Cherene Altes, MD [facility] Office  [phone]

## 2023-01-12 ENCOUNTER — Ambulatory Visit (INDEPENDENT_AMBULATORY_CARE_PROVIDER_SITE_OTHER): Payer: Medicare PPO | Admitting: Neurology

## 2023-01-12 ENCOUNTER — Encounter: Payer: Self-pay | Admitting: Neurology

## 2023-01-12 DIAGNOSIS — F02B0 Dementia in other diseases classified elsewhere, moderate, without behavioral disturbance, psychotic disturbance, mood disturbance, and anxiety: Secondary | ICD-10-CM | POA: Diagnosis not present

## 2023-01-12 DIAGNOSIS — F482 Pseudobulbar affect: Secondary | ICD-10-CM | POA: Diagnosis not present

## 2023-01-12 DIAGNOSIS — G301 Alzheimer's disease with late onset: Secondary | ICD-10-CM

## 2023-01-12 DIAGNOSIS — I6350 Cerebral infarction due to unspecified occlusion or stenosis of unspecified cerebral artery: Secondary | ICD-10-CM | POA: Diagnosis not present

## 2023-01-12 NOTE — Patient Instructions (Signed)
Continue with aspirin and Plavix for total of 55-month and continue aspirin alone Continue your other medications Encourage physical therapy at least 3 times a week Encourage p.o. intake, fluid intake Follow-up in 1 year

## 2023-01-12 NOTE — Progress Notes (Signed)
[facility]  PATIENT: Trevor Phillips DOB: 04-16-1931  REQUESTING CLINICIAN: Lajean Manes, MD HISTORY FROM: Patient, son and chart review  REASON FOR VISIT: Right pontine stroke    HISTORICAL  CHIEF COMPLAINT:  Chief Complaint  Patient presents with   New Patient (Initial Visit)    Rm 17. Patient with son, patient from facility.    HISTORY OF PRESENT ILLNESS:  This is a 89 year old gentleman past medical history of dementia, prostate cancer status post prostatectomy, recurrent UTI, Foley catheter in place, hypertension, hyperlipidemia who is presenting after recent right pontine stroke on January 16.  Patient was found down at the living facility and was taken to the ED.  In the ED, he was found to have a right pontine stroke, stroke etiology likely large vessel disease.  He was discharged on DAPT, aspirin and Plavix for total of 3 months and plan to continue with aspirin thereafter.  LDL was 123 Lipitor was added.  Since discharge from the hospital son reported patient continued to improve, he is doing physical therapy at least 3 times a day but he is now crying uncontrollably.  He had also falls at the assisted living facility while attempting to get out of bed.   Hospital summary and course  89 year old ALF resident with a history of dementia, prostate cancer status post radical prostatectomy, bladder neck contracture, diverticular disease, urinary retention, and recurrent UTI who was found down following an unwitnessed fall with left-sided weakness being appreciated afterwards. In the ED CT head and cervical spine was negative for acute findings but MRI revealed an acute right pons CVA.    Acute CVA R pons  -Resultant left-sided weakness and facial droop -Revealed on MRI of brain -CTa head and neck reveals no LVO but severe stenosis in the distal right ICA, distal right A3, proximal left V4, and proximal left P2 with 75% stenosis in the proximal ICA  bilaterally as well as disease of the left vertebral artery and right vertebral artery -TTE notes mitral valve prolapse which appears to be mild with a EF 75% and no WMA with no obvious intracardiac source of embolization and no intracardiac shunt - felt to be due to severe concurrent extracranial and intracranial anterior and posterior circulation occlusive atherosclerotic disease  -Daily ASA '81mg'$  plus Plavx for 3 months then aspirin 81 mg alone per Neurology -LDL 123 - continue Zetia - added Lipitor  -A1c 5.2 -PT/OT/SLP - suggestion is for SNF rehab  OTHER MEDICAL CONDITIONS: Hypertension, hyperlipidemia, prostate cancer, dementia    REVIEW OF SYSTEMS: Full 14 system review of systems performed and negative with exception of: As noted in the HPI  ALLERGIES: No Known Allergies  HOME MEDICATIONS: Outpatient Medications Prior to Visit  Medication Sig Dispense Refill   acetaminophen (TYLENOL) 325 MG tablet Take 2 tablets (650 mg total) by mouth every 4 (four) hours as needed for mild pain (or temp > 37.5 C (99.5 F)).     aspirin 81 MG chewable tablet Chew 1 tablet (81 mg total) by mouth daily.     atorvastatin (LIPITOR) 40 MG tablet Take 1 tablet (40 mg total) by mouth daily.     Cholecalciferol (VITAMIN D3) 2000 UNITS TABS Take 2,000 Units by mouth daily.     clopidogrel (PLAVIX) 75 MG tablet Take 1 tablet (75 mg total) by mouth daily.     donepezil (ARICEPT) 10 MG tablet Take 10 mg by mouth at bedtime.     ezetimibe (ZETIA) 10 MG tablet  Take 10 mg by mouth every evening.      ferrous sulfate 325 (65 FE) MG EC tablet Take 325 mg by mouth daily with breakfast.     methenamine (HIPREX) 1 G tablet Take 0.5 g by mouth 2 (two) times daily with a meal.     QUEtiapine (SEROQUEL) 25 MG tablet Take 1 tablet (25 mg total) by mouth at bedtime.     carvedilol (COREG) 3.125 MG tablet Take 1 tablet (3.125 mg total) by mouth 2 (two) times daily with a meal. (Patient not taking: Reported on 01/12/2023)      No facility-administered medications prior to visit.    PAST MEDICAL HISTORY: Past Medical History:  Diagnosis Date   Anemia due to acute blood loss    gross hematuria   Bladder neck contracture    chronic   Diverticulosis of colon    Foley catheter in place    placed 07-11-2016   Gross hematuria    History of cerebral infarction    per CT 05-18-2012  chronic lacunar infarcts in basal ganglia  (silent?,  pt denies history S&S)   History of colon polyps    2004   History of diverticulitis    2014   History of prostate cancer 2003   s/p  radical prostatectomy 03-21-2002--  no recurrence (last PSA 07/2016  0.01)   History of recurrent UTIs    History of urinary retention    Neurogenic bladder    since prostatectomy 2003   Self-catheterizes urinary bladder    SECONDARY TO NEUROGENIC BLADDER    Urinary retention    Wears glasses     PAST SURGICAL HISTORY: Past Surgical History:  Procedure Laterality Date   CATARACT EXTRACTION W/ INTRAOCULAR LENS  IMPLANT, BILATERAL  2010   CYSTO/  DILATATION BLADDER NECK CONTRACTURE  06/02/2002   CYSTOSCOPY W/ RETROGRADES  07/21/2016   Procedure: CYSTOSCOPY WITH RETROGRADE PYELOGRAM, CLOT EVACUATION, BLADDER BIOPSY WITH FULGERATION;  Surgeon: Festus Aloe, MD;  Location: [place];  Service: Urology;;  0.5 TO 2 CM    EYE SURGERY Right yrs ago   PROSTATE SURGERY  yrs ago   [place] /  REPAIR RECTAL PERFORATION  03-21-2002  dr Jori Moll davis   TRANSURETHRAL RESECTION OF PROSTATE  09/06/2001    FAMILY HISTORY: History reviewed. No pertinent family history.  SOCIAL HISTORY: Social History   Socioeconomic History   Marital status: Married    Spouse name: Not on file   Number of children: Not on file   Years of education: Not on file   Highest education level: Not on file  Occupational History   Not on file  Tobacco Use   Smoking status: Former    Packs/day: 1.00    Years: 8.00    Total  pack years: 8.00    Types: Cigarettes    Quit date: 07/16/1957    Years since quitting: 65.5   Smokeless tobacco: Former    Types: Snuff    Quit date: 07/17/1979  Substance and Sexual Activity   Alcohol use: Yes    Comment: "very little"   Drug use: No   Sexual activity: Not on file  Other Topics Concern   Not on file  Social History Narrative   Not on file   Social Determinants of Health   Financial Resource Strain: Not on file  Food Insecurity: No Food Insecurity (11/26/2022)   Hunger Vital Sign    Worried About [place] in the  Last Year: Never true    [place] in the Last Year: Never true  Transportation Needs: No Transportation Needs (11/26/2022)   PRAPARE - Hydrologist (Medical): No    Lack of Transportation (Non-Medical): No  Physical Activity: Not on file  Stress: Not on file  Social Connections: Not on file  Intimate Partner Violence: Not At Risk (11/26/2022)   Humiliation, Afraid, Rape, and Kick questionnaire    Fear of Current or Ex-Partner: No    Emotionally Abused: No    Physically Abused: No    Sexually Abused: No    PHYSICAL EXAM  GENERAL EXAM/CONSTITUTIONAL: Vitals: There were no vitals filed for this visit. There is no height or weight on file to calculate BMI. Wt Readings from Last 3 Encounters:  11/26/22 132 lb 4.4 oz (60 kg)  07/21/16 132 lb 5 oz (60 kg)  07/11/16 129 lb 6.4 oz (58.7 kg)   Patient is in no distress; well developed, nourished and groomed; neck is supple  EYES: Visual fields full to confrontation, Extraocular movements intacts,   MUSCULOSKELETAL: Gait, strength, tone, movements noted in Neurologic exam below  NEUROLOGIC: MENTAL STATUS:     01/12/2023   10:40 AM  MMSE - Mini Mental State Exam  Orientation to time 0  Orientation to Place 1  Registration 3  Attention/ Calculation 2  Recall 0  Language- name 2 objects 0  Language- repeat 1  Language- follow 3 step command 3   Language- read & follow direction 1  Write a sentence 0  Copy design 0  Total score 11   Able to name, repeat, follow simple direction. Able to count the number of quarters in $1 and $2. Able to state the days of the week backward.   CRANIAL NERVE:  2nd, 3rd, 4th, 6th - extraocular muscles intact, no nystagmus 5th - facial sensation symmetric 7th - facial strength symmetric 8th - hearing intact 9th - palate elevates symmetrically, uvula midline 11th - shoulder shrug symmetric 12th - tongue protrusion midline  MOTOR:  normal bulk and tone, Able to lift BUE at least 10 seconds and BLEs at least 5 seconds  SENSORY:  normal and symmetric to light touch  COORDINATION:  finger-nose-finger, fine finger movements normal  GAIT/STATION:  Deferred    DIAGNOSTIC DATA (LABS, IMAGING, TESTING) - I reviewed patient records, labs, notes, testing and imaging myself where available.  Lab Results  Component Value Date   WBC 17.9 (H) 12/01/2022   HGB 14.7 12/01/2022   HCT 44.5 12/01/2022   MCV 96.3 12/01/2022   PLT 303 12/01/2022      Component Value Date/Time   NA 141 12/01/2022 0754   K 3.8 12/01/2022 0754   CL 109 12/01/2022 0754   CO2 19 (L) 12/01/2022 0754   GLUCOSE 128 (H) 12/01/2022 0754   BUN 50 (H) 12/01/2022 0754   CREATININE 1.36 (H) 12/01/2022 0754   CALCIUM 9.4 12/01/2022 0754   PROT 6.9 11/24/2022 1535   ALBUMIN 4.1 11/24/2022 1535   AST 21 11/24/2022 1535   ALT 20 11/24/2022 1535   ALKPHOS 54 11/24/2022 1535   BILITOT 2.0 (H) 11/24/2022 1535   GFRNONAA 50 (L) 12/01/2022 0754   GFRAA >60 10/24/2019 0003   Lab Results  Component Value Date   CHOL 174 11/25/2022   HDL 43 11/25/2022   LDLCALC 123 (H) 11/25/2022   TRIG 42 11/25/2022   CHOLHDL 4.0 11/25/2022   Lab Results  Component Value  Date   HGBA1C 5.2 11/24/2022   No results found for: "VITAMINB12" No results found for: "TSH"  MRI Brain 11/24/2022 Acute infarct in the anterior right pons.   CTA  Head and Neck 11/25/2022 1. No intracranial large vessel occlusion. 2. Severe stenosis in the distal right ICA, distal right A3, proximal left V4 and in the proximal left P2. Additional moderate to severe stenosis in the mid right V4. Milder stenosis as described above. 3. Approximately 75% stenosis in the proximal ICA bilaterally. 4. Moderate stenosis at the origin of the left vertebral artery and severe stenosis at the origin of the right vertebral artery, with additional moderate stenosis in the left V2 and severe stenosis at the left V3-V4 junction. 5. Aortic atherosclerosis.   Echocardiogram  1. Prolapse of posterior MV leaflet with eccentric, anteriorly directed  MR; appears mild but with splay artifact and may be underestimated.  2. Left ventricular ejection fraction, by estimation, is >75%. The left  ventricle has hyperdynamic function. The left ventricle has no regional  wall motion abnormalities. Left ventricular diastolic parameters are  consistent with Grade I diastolic  dysfunction (impaired relaxation). Elevated left atrial pressure.  3. Right ventricular systolic function is normal. The right ventricular size is normal.  4. The mitral valve is abnormal. Mild mitral valve regurgitation. No evidence of mitral stenosis. There is moderate holosystolic prolapse of posterior leaflet of the mitral valve.  5. The aortic valve is tricuspid. Aortic valve regurgitation is not visualized. Aortic valve sclerosis/calcification is present, without any evidence of aortic stenosis.  6. The inferior vena cava is normal in size with greater than 50% respiratory variability, suggesting right atrial pressure of 3 mmHg.   ASSESSMENT AND PLAN  89 y.o. year old male with dementia, hypertension, hyperlipidemia, prostate cancer who is presenting after right pontine stroke.  Patient continued to improve, doing PT at least 3 times a week.  He is also on DAPT, aspirin and Plavix for a total of 3 months, and will  continue with aspirin thereafter.  He was also noted to cry uncontrollably, likely developed pseudobulbar affect.  Discussed treatment with Nuedexta with son and he would like to defer at the moment.  For now we will continue current medications, encourage patient to hydrate himself and will also encourage physical therapy.  I will see him in 1 year for follow-up.  Advised him to contact me if he want to proceed with treatment    1. Right pontine stroke ([place])   2. Pseudobulbar affect   3. Moderate late onset Alzheimer's dementia without behavioral disturbance, psychotic disturbance, mood disturbance, or anxiety (HCC)      Patient Instructions  Continue with aspirin and Plavix for total of 71-month and continue aspirin alone Continue your other medications Encourage physical therapy at least 3 times a week Encourage p.o. intake, fluid intake Follow-up in 1 year  No orders of the defined types were placed in this encounter.   No orders of the defined types were placed in this encounter.   Return in about 1 year (around 01/12/2024).    AAlric Ran MD 01/12/2023, 11:39 AM  [facility] [address] [place] [phone]

## 2023-01-20 ENCOUNTER — Inpatient Hospital Stay (HOSPITAL_COMMUNITY): Payer: Medicare PPO

## 2023-01-20 ENCOUNTER — Encounter (HOSPITAL_COMMUNITY): Payer: Self-pay

## 2023-01-20 ENCOUNTER — Inpatient Hospital Stay (HOSPITAL_COMMUNITY)
Admission: EM | Admit: 2023-01-20 | Discharge: 2023-01-25 | DRG: 699 | Disposition: A | Discharge status: Skilled Nursing Facility | Payer: Medicare PPO | Source: Skilled Nursing Facility | Attending: Internal Medicine | Admitting: Internal Medicine

## 2023-01-20 ENCOUNTER — Other Ambulatory Visit: Payer: Self-pay

## 2023-01-20 DIAGNOSIS — Z8546 Personal history of malignant neoplasm of prostate: Secondary | ICD-10-CM | POA: Diagnosis not present

## 2023-01-20 DIAGNOSIS — E440 Moderate protein-calorie malnutrition: Secondary | ICD-10-CM | POA: Diagnosis present

## 2023-01-20 DIAGNOSIS — E876 Hypokalemia: Secondary | ICD-10-CM | POA: Diagnosis present

## 2023-01-20 DIAGNOSIS — F0390 Unspecified dementia without behavioral disturbance: Secondary | ICD-10-CM | POA: Diagnosis present

## 2023-01-20 DIAGNOSIS — D62 Acute posthemorrhagic anemia: Secondary | ICD-10-CM | POA: Diagnosis present

## 2023-01-20 DIAGNOSIS — T83511A Infection and inflammatory reaction due to indwelling urethral catheter, initial encounter: Principal | ICD-10-CM | POA: Diagnosis present

## 2023-01-20 DIAGNOSIS — N320 Bladder-neck obstruction: Secondary | ICD-10-CM | POA: Diagnosis present

## 2023-01-20 DIAGNOSIS — E785 Hyperlipidemia, unspecified: Secondary | ICD-10-CM | POA: Diagnosis present

## 2023-01-20 DIAGNOSIS — R6510 Systemic inflammatory response syndrome (SIRS) of non-infectious origin without acute organ dysfunction: Secondary | ICD-10-CM | POA: Diagnosis present

## 2023-01-20 DIAGNOSIS — L89610 Pressure ulcer of right heel, unstageable: Secondary | ICD-10-CM | POA: Diagnosis present

## 2023-01-20 DIAGNOSIS — Y846 Urinary catheterization as the cause of abnormal reaction of the patient, or of later complication, without mention of misadventure at the time of the procedure: Secondary | ICD-10-CM | POA: Diagnosis present

## 2023-01-20 DIAGNOSIS — I10 Essential (primary) hypertension: Secondary | ICD-10-CM

## 2023-01-20 DIAGNOSIS — E872 Acidosis, unspecified: Secondary | ICD-10-CM | POA: Diagnosis present

## 2023-01-20 DIAGNOSIS — Z978 Presence of other specified devices: Secondary | ICD-10-CM

## 2023-01-20 DIAGNOSIS — Z87891 Personal history of nicotine dependence: Secondary | ICD-10-CM

## 2023-01-20 DIAGNOSIS — Z87440 Personal history of urinary (tract) infections: Secondary | ICD-10-CM

## 2023-01-20 DIAGNOSIS — Z961 Presence of intraocular lens: Secondary | ICD-10-CM | POA: Diagnosis present

## 2023-01-20 DIAGNOSIS — Z7982 Long term (current) use of aspirin: Secondary | ICD-10-CM | POA: Diagnosis not present

## 2023-01-20 DIAGNOSIS — R319 Hematuria, unspecified: Secondary | ICD-10-CM | POA: Diagnosis present

## 2023-01-20 DIAGNOSIS — Z9079 Acquired absence of other genital organ(s): Secondary | ICD-10-CM

## 2023-01-20 DIAGNOSIS — Z8719 Personal history of other diseases of the digestive system: Secondary | ICD-10-CM

## 2023-01-20 DIAGNOSIS — N319 Neuromuscular dysfunction of bladder, unspecified: Secondary | ICD-10-CM | POA: Diagnosis present

## 2023-01-20 DIAGNOSIS — Z681 Body mass index (BMI) 19 or less, adult: Secondary | ICD-10-CM

## 2023-01-20 DIAGNOSIS — Z79899 Other long term (current) drug therapy: Secondary | ICD-10-CM

## 2023-01-20 DIAGNOSIS — Z7902 Long term (current) use of antithrombotics/antiplatelets: Secondary | ICD-10-CM | POA: Diagnosis not present

## 2023-01-20 DIAGNOSIS — N179 Acute kidney failure, unspecified: Secondary | ICD-10-CM | POA: Diagnosis present

## 2023-01-20 DIAGNOSIS — N390 Urinary tract infection, site not specified: Secondary | ICD-10-CM | POA: Diagnosis present

## 2023-01-20 DIAGNOSIS — N289 Disorder of kidney and ureter, unspecified: Secondary | ICD-10-CM

## 2023-01-20 DIAGNOSIS — R339 Retention of urine, unspecified: Secondary | ICD-10-CM | POA: Diagnosis present

## 2023-01-20 DIAGNOSIS — C61 Malignant neoplasm of prostate: Secondary | ICD-10-CM | POA: Diagnosis present

## 2023-01-20 DIAGNOSIS — Z9842 Cataract extraction status, left eye: Secondary | ICD-10-CM

## 2023-01-20 DIAGNOSIS — Z8673 Personal history of transient ischemic attack (TIA), and cerebral infarction without residual deficits: Secondary | ICD-10-CM | POA: Diagnosis not present

## 2023-01-20 DIAGNOSIS — R8281 Pyuria: Secondary | ICD-10-CM

## 2023-01-20 DIAGNOSIS — R310 Gross hematuria: Secondary | ICD-10-CM | POA: Diagnosis present

## 2023-01-20 DIAGNOSIS — Z9841 Cataract extraction status, right eye: Secondary | ICD-10-CM

## 2023-01-20 LAB — URINALYSIS, W/ REFLEX TO CULTURE (INFECTION SUSPECTED)
Bilirubin Urine: NEGATIVE
Glucose, UA: NEGATIVE mg/dL
Ketones, ur: NEGATIVE mg/dL
Nitrite: NEGATIVE
Protein, ur: 100 mg/dL — AB
RBC / HPF: >50 RBC/hpf (ref 0–5)
Specific Gravity, Urine: 1.01 (ref 1.005–1.030)
WBC, UA: >50 WBC/hpf (ref 0–5)
pH: 6 (ref 5.0–8.0)

## 2023-01-20 LAB — COMPREHENSIVE METABOLIC PANEL
ALT: 97 U/L — ABNORMAL HIGH (ref 0–44)
AST: 68 U/L — ABNORMAL HIGH (ref 15–41)
Albumin: 2.1 g/dL — ABNORMAL LOW (ref 3.5–5.0)
Alkaline Phosphatase: 83 U/L (ref 38–126)
Anion gap: 11 (ref 5–15)
BUN: 39 mg/dL — ABNORMAL HIGH (ref 8–23)
CO2: 18 mmol/L — ABNORMAL LOW (ref 22–32)
Calcium: 8.1 mg/dL — ABNORMAL LOW (ref 8.9–10.3)
Chloride: 114 mmol/L — ABNORMAL HIGH (ref 98–111)
Creatinine, Ser: 1.47 mg/dL — ABNORMAL HIGH (ref 0.61–1.24)
GFR, Estimated: 45 mL/min — ABNORMAL LOW (ref >60)
Glucose, Bld: 87 mg/dL (ref 70–99)
Potassium: 3.5 mmol/L (ref 3.5–5.1)
Sodium: 143 mmol/L (ref 135–145)
Total Bilirubin: 0.7 mg/dL (ref 0.3–1.2)
Total Protein: 5.5 g/dL — ABNORMAL LOW (ref 6.5–8.1)

## 2023-01-20 LAB — CBC WITH DIFFERENTIAL/PLATELET
Abs Immature Granulocytes: 0.33 10*3/uL — ABNORMAL HIGH (ref 0.00–0.07)
Basophils Absolute: 0.1 10*3/uL (ref 0.0–0.1)
Basophils Relative: 1 %
Eosinophils Absolute: 0.3 10*3/uL (ref 0.0–0.5)
Eosinophils Relative: 2 %
HCT: 26.7 % — ABNORMAL LOW (ref 39.0–52.0)
Hemoglobin: 8.5 g/dL — ABNORMAL LOW (ref 13.0–17.0)
Immature Granulocytes: 2 %
Lymphocytes Relative: 12 %
Lymphs Abs: 2 10*3/uL (ref 0.7–4.0)
MCH: 31.8 pg (ref 26.0–34.0)
MCHC: 31.8 g/dL (ref 30.0–36.0)
MCV: 100 fL (ref 80.0–100.0)
Monocytes Absolute: 1 10*3/uL (ref 0.1–1.0)
Monocytes Relative: 6 %
Neutro Abs: 13.4 10*3/uL — ABNORMAL HIGH (ref 1.7–7.7)
Neutrophils Relative %: 77 %
Platelets: 360 10*3/uL (ref 150–400)
RBC: 2.67 MIL/uL — ABNORMAL LOW (ref 4.22–5.81)
RDW: 14.3 % (ref 11.5–15.5)
WBC: 17.1 10*3/uL — ABNORMAL HIGH (ref 4.0–10.5)
nRBC: 0 % (ref 0.0–0.2)

## 2023-01-20 LAB — CBC
HCT: 29.4 % — ABNORMAL LOW (ref 39.0–52.0)
Hemoglobin: 9.1 g/dL — ABNORMAL LOW (ref 13.0–17.0)
MCH: 31.1 pg (ref 26.0–34.0)
MCHC: 31 g/dL (ref 30.0–36.0)
MCV: 100.3 fL — ABNORMAL HIGH (ref 80.0–100.0)
Platelets: 393 10*3/uL (ref 150–400)
RBC: 2.93 MIL/uL — ABNORMAL LOW (ref 4.22–5.81)
RDW: 14.3 % (ref 11.5–15.5)
WBC: 16.1 10*3/uL — ABNORMAL HIGH (ref 4.0–10.5)
nRBC: 0 % (ref 0.0–0.2)

## 2023-01-20 LAB — PROTIME-INR
INR: 1.4 — ABNORMAL HIGH (ref 0.8–1.2)
Prothrombin Time: 17.2 seconds — ABNORMAL HIGH (ref 11.4–15.2)

## 2023-01-20 LAB — TYPE AND SCREEN
ABO/RH(D): O POS
Antibody Screen: NEGATIVE

## 2023-01-20 LAB — BASIC METABOLIC PANEL
Anion gap: 8 (ref 5–15)
BUN: 44 mg/dL — ABNORMAL HIGH (ref 8–23)
CO2: 14 mmol/L — ABNORMAL LOW (ref 22–32)
Calcium: 6.5 mg/dL — ABNORMAL LOW (ref 8.9–10.3)
Chloride: 120 mmol/L — ABNORMAL HIGH (ref 98–111)
Creatinine, Ser: 1.44 mg/dL — ABNORMAL HIGH (ref 0.61–1.24)
GFR, Estimated: 46 mL/min — ABNORMAL LOW (ref >60)
Glucose, Bld: 83 mg/dL (ref 70–99)
Potassium: 3.3 mmol/L — ABNORMAL LOW (ref 3.5–5.1)
Sodium: 142 mmol/L (ref 135–145)

## 2023-01-20 LAB — HEMOGLOBIN AND HEMATOCRIT, BLOOD
HCT: 31.5 % — ABNORMAL LOW (ref 39.0–52.0)
Hemoglobin: 10.2 g/dL — ABNORMAL LOW (ref 13.0–17.0)

## 2023-01-20 LAB — LACTIC ACID, PLASMA
Lactic Acid, Venous: 1.4 mmol/L (ref 0.5–1.9)
Lactic Acid, Venous: 1.6 mmol/L (ref 0.5–1.9)

## 2023-01-20 MED ORDER — DONEPEZIL HCL 10 MG PO TABS
10.0000 mg | ORAL_TABLET | Freq: Every day | ORAL | Status: DC
  Administered 2023-01-20 – 2023-01-24 (×5): 10 mg via ORAL
  Filled 2023-01-20 (×5): qty 1

## 2023-01-20 MED ORDER — ATORVASTATIN CALCIUM 40 MG PO TABS
40.0000 mg | ORAL_TABLET | Freq: Every day | ORAL | Status: DC
  Administered 2023-01-20 – 2023-01-25 (×6): 40 mg via ORAL
  Filled 2023-01-20 (×6): qty 1

## 2023-01-20 MED ORDER — ACETAMINOPHEN 650 MG RE SUPP: 650.0000 mg | Freq: Four times a day (QID) | RECTAL | Status: DC | PRN

## 2023-01-20 MED ORDER — ONDANSETRON HCL 4 MG PO TABS: 4.0000 mg | ORAL_TABLET | Freq: Four times a day (QID) | ORAL | Status: DC | PRN

## 2023-01-20 MED ORDER — SODIUM CHLORIDE 0.9 % IV SOLN: INTRAVENOUS | Status: AC

## 2023-01-20 MED ORDER — SODIUM CHLORIDE 0.9 % IV BOLUS
1000.0000 mL | Freq: Once | INTRAVENOUS | Status: AC
  Administered 2023-01-20: 1000 mL via INTRAVENOUS

## 2023-01-20 MED ORDER — SODIUM CHLORIDE 0.9 % IV SOLN
1.0000 g | INTRAVENOUS | Status: AC
  Administered 2023-01-20 – 2023-01-22 (×3): 1 g via INTRAVENOUS
  Filled 2023-01-20 (×3): qty 10

## 2023-01-20 MED ORDER — EZETIMIBE 10 MG PO TABS
10.0000 mg | ORAL_TABLET | Freq: Every evening | ORAL | Status: DC
  Administered 2023-01-20 – 2023-01-25 (×6): 10 mg via ORAL
  Filled 2023-01-20 (×6): qty 1

## 2023-01-20 MED ORDER — ONDANSETRON HCL 4 MG/2ML IJ SOLN
4.0000 mg | Freq: Four times a day (QID) | INTRAMUSCULAR | Status: DC | PRN
  Administered 2023-01-22: 4 mg via INTRAVENOUS
  Filled 2023-01-20: qty 2

## 2023-01-20 MED ORDER — FERROUS SULFATE 325 (65 FE) MG PO TABS
325.0000 mg | ORAL_TABLET | Freq: Every day | ORAL | Status: DC
  Administered 2023-01-20 – 2023-01-21 (×2): 325 mg via ORAL
  Filled 2023-01-20 (×2): qty 1

## 2023-01-20 MED ORDER — ACETAMINOPHEN 325 MG PO TABS
650.0000 mg | ORAL_TABLET | Freq: Four times a day (QID) | ORAL | Status: DC | PRN
  Administered 2023-01-20 – 2023-01-21 (×2): 650 mg via ORAL
  Filled 2023-01-20 (×2): qty 2

## 2023-01-20 MED ORDER — METHENAMINE HIPPURATE 1 G PO TABS
0.5000 g | ORAL_TABLET | Freq: Two times a day (BID) | ORAL | Status: DC
  Filled 2023-01-20 (×3): qty 1

## 2023-01-20 MED ORDER — QUETIAPINE FUMARATE 25 MG PO TABS
25.0000 mg | ORAL_TABLET | Freq: Every day | ORAL | Status: DC
  Administered 2023-01-20 – 2023-01-24 (×5): 25 mg via ORAL
  Filled 2023-01-20 (×5): qty 1

## 2023-01-20 MED ORDER — GERHARDT'S BUTT CREAM
TOPICAL_CREAM | Freq: Two times a day (BID) | CUTANEOUS | Status: DC
  Filled 2023-01-20 (×2): qty 1

## 2023-01-20 MED ORDER — MEDIHONEY WOUND/BURN DRESSING EX PSTE
1.0000 | PASTE | Freq: Every day | CUTANEOUS | Status: DC
  Administered 2023-01-20 – 2023-01-25 (×6): 1 via TOPICAL
  Filled 2023-01-20: qty 44

## 2023-01-20 NOTE — ED Notes (Signed)
Irrigated patients foley with 1000 ml saline. Pt still having blood in urine. Foley catheter site is bloody and appears to not have been cleaned. Foley care was provided and patient linen change.

## 2023-01-20 NOTE — ED Notes (Signed)
Discussed urine collection with Dr. Roxanne Mins and was instructed to send urine even though it is bloody so a culture could be done. Urine has been sent at this time.

## 2023-01-20 NOTE — H&P (Addendum)
History and Physical    Trevor Phillips [id number] DOB: 10/08/1931 DOA: 01/20/2023  PCP: Lajean Manes, MD Patient coming from: SNF  Chief Complaint: hematuria  HPI: Trevor Phillips is a 89 y.o. male with medical history significant of dementia, chronic foley, recurrent UTIs, UTI on methenamine, hx of urinary retention, bladder neck contracture, remote prostate cancer s/p radical prostatectomy, diverticular disease, who presents hematuria.  Patient has dementia and is poor historian.  The H&P is obtained by chart review, talking to ED staff and the patient's son Trevor Phillips over the phone.  Patient was sent from skilled nursing facility due to gross blood was found in his Foley catheter.  The patient cannot provide any details.  In the emergency room, vitals showed temperature 97.8 F, pulse 99, RR 17, BP 114/56 and room air O2 sats 94%.  Labs showed WBC 17.1 K, hemoglobin 8.5, normal platelets, INR 1.4, K3.3, CO2 14, BUN 44, creatinine 1.44, UA showing pyuria.  Per ED report, his Foley was irrigated at ED. ED had discussed the case with Urologist Dr. Louis Phillips who requests the patient be admitted to internal medicine service and be moved to [facility] where he can evaluate the patient.   Unable to provide review of systems due to dementia  Review of Systems: As per HPI otherwise 10 point review of systems negative.  Review of Systems Otherwise negative except as per HPI, including: Positive for hematuria  Positive for cognitive impairment from dementia Unable to provide review of systems due to dementia  Past Medical History:  Diagnosis Date   Anemia due to acute blood loss    gross hematuria   Bladder neck contracture    chronic   Diverticulosis of colon    Foley catheter in place    placed 07-11-2016   Gross hematuria    History of cerebral infarction    per CT 05-18-2012  chronic lacunar infarcts in basal ganglia  (silent?,  pt denies history S&S)   History of colon  polyps    2004   History of diverticulitis    2014   History of prostate cancer 2003   s/p  radical prostatectomy 03-21-2002--  no recurrence (last PSA 07/2016  0.01)   History of recurrent UTIs    History of urinary retention    Neurogenic bladder    since prostatectomy 2003   Self-catheterizes urinary bladder    SECONDARY TO NEUROGENIC BLADDER    Urinary retention    Wears glasses     Past Surgical History:  Procedure Laterality Date   CATARACT EXTRACTION W/ INTRAOCULAR LENS  IMPLANT, BILATERAL  2010   CYSTO/  DILATATION BLADDER NECK CONTRACTURE  06/02/2002   CYSTOSCOPY W/ RETROGRADES  07/21/2016   Procedure: CYSTOSCOPY WITH RETROGRADE PYELOGRAM, CLOT EVACUATION, BLADDER BIOPSY WITH FULGERATION;  Surgeon: Festus Aloe, MD;  Location: [place];  Service: Urology;;  0.5 TO 2 CM    EYE SURGERY Right yrs ago   PROSTATE SURGERY  yrs ago   [place] /  REPAIR RECTAL PERFORATION  03-21-2002  dr Jori Moll davis   TRANSURETHRAL RESECTION OF PROSTATE  09/06/2001    SOCIAL HISTORY:  reports that he quit smoking about 65 years ago. His smoking use included cigarettes. He has a 8.00 pack-year smoking history. He quit smokeless tobacco use about 43 years ago.  His smokeless tobacco use included snuff. He reports current alcohol use. He reports that he does not use drugs.  No Known Allergies  FAMILY HISTORY: History reviewed. No pertinent family history.   Prior to Admission medications   Medication Sig Start Date End Date Taking? Authorizing Provider  acetaminophen (TYLENOL) 325 MG tablet Take 2 tablets (650 mg total) by mouth every 4 (four) hours as needed for mild pain (or temp > 37.5 C (99.5 F)). 12/01/22   Cherene Altes, MD  aspirin 81 MG chewable tablet Chew 1 tablet (81 mg total) by mouth daily. 12/02/22   Cherene Altes, MD  atorvastatin (LIPITOR) 40 MG tablet Take 1 tablet (40 mg total) by mouth daily. 12/02/22   Cherene Altes,  MD  carvedilol (COREG) 3.125 MG tablet Take 1 tablet (3.125 mg total) by mouth 2 (two) times daily with a meal. Patient not taking: Reported on 01/12/2023 12/01/22   Cherene Altes, MD  Cholecalciferol (VITAMIN D3) 2000 UNITS TABS Take 2,000 Units by mouth daily.    [provider]  clopidogrel (PLAVIX) 75 MG tablet Take 1 tablet (75 mg total) by mouth daily. 12/02/22 03/02/23  Cherene Altes, MD  donepezil (ARICEPT) 10 MG tablet Take 10 mg by mouth at bedtime.    [provider]  ezetimibe (ZETIA) 10 MG tablet Take 10 mg by mouth every evening.     [provider]  ferrous sulfate 325 (65 FE) MG EC tablet Take 325 mg by mouth daily with breakfast.    [provider]  methenamine (HIPREX) 1 G tablet Take 0.5 g by mouth 2 (two) times daily with a meal.    [provider]  QUEtiapine (SEROQUEL) 25 MG tablet Take 1 tablet (25 mg total) by mouth at bedtime. 12/01/22   Cherene Altes, MD    Physical Exam: Vitals:   01/20/23 0545 01/20/23 0700 01/20/23 0715 01/20/23 0730  BP: 112/69 (!) 124/52 (!) 114/50 (!) 131/47  Pulse: 100 96 91 91  Resp: '18 19 18 18  '$ Temp:      TempSrc:      SpO2: 100% 95% 95% 97%      Constitutional: NAD, calm, comfortable.  Chronic ill-appearing.  Acute ill-appearing.  Pale. Eyes: PERRL, lids and conjunctivae normal ENMT: Mucous membranes are moist. Posterior pharynx clear of any exudate or lesions.Normal dentition.  Neck: normal, supple, no masses, no thyromegaly Respiratory: clear to auscultation bilaterally, no wheezing, no crackles. Normal respiratory effort. No accessory muscle use.  Cardiovascular: Regular rate and rhythm, no murmurs / rubs / gallops. No extremity edema. 2+ pedal pulses. No carotid bruits.  Abdomen: no tenderness, no masses palpated. No hepatosplenomegaly. Bowel sounds positive.  GU-hematuria noted in Foley catheter Musculoskeletal: no clubbing / cyanosis. No joint deformity upper and lower  extremities. Good ROM, no contractures. Normal muscle tone.  Skin: no rashes, lesions, ulcers. No induration Neurologic: CN 2-12 grossly intact. Sensation intact, DTR normal. Strength 5/5 in all 4.  Psychiatric: Normal judgment and insight. Alert and oriented x 3. Normal mood.     Labs on Admission: I have personally reviewed following labs and imaging studies  CBC: Recent Labs  Lab 01/20/23 0133 01/20/23 0805  WBC 17.1* 16.1*  NEUTROABS 13.4*  --   HGB 8.5* 9.1*  HCT 26.7* 29.4*  MCV 100.0 100.3*  PLT 360 [id number]   Basic Metabolic Panel: Recent Labs  Lab 01/20/23 0357  Trevor Phillips 142  K 3.3*  CL 120*  CO2 14*  GLUCOSE 83  BUN 44*  CREATININE 1.44*  CALCIUM 6.5*   GFR: CrCl cannot be calculated (Unknown ideal weight.). Liver Function  Tests: No results for input(s): "AST", "ALT", "ALKPHOS", "BILITOT", "PROT", "ALBUMIN" in the last 168 hours. No results for input(s): "LIPASE", "AMYLASE" in the last 168 hours. No results for input(s): "AMMONIA" in the last 168 hours. Coagulation Profile: Recent Labs  Lab 01/20/23 0133  INR 1.4*   Cardiac Enzymes: No results for input(s): "CKTOTAL", "CKMB", "CKMBINDEX", "TROPONINI" in the last 168 hours. BNP (last 3 results) No results for input(s): "PROBNP" in the last 8760 hours. HbA1C: No results for input(s): "HGBA1C" in the last 72 hours. CBG: No results for input(s): "GLUCAP" in the last 168 hours. Lipid Profile: No results for input(s): "CHOL", "HDL", "LDLCALC", "TRIG", "CHOLHDL", "LDLDIRECT" in the last 72 hours. Thyroid Function Tests: No results for input(s): "TSH", "T4TOTAL", "FREET4", "T3FREE", "THYROIDAB" in the last 72 hours. Anemia Panel: No results for input(s): "VITAMINB12", "FOLATE", "FERRITIN", "TIBC", "IRON", "RETICCTPCT" in the last 72 hours. Urine analysis:    Component Value Date/Time   COLORURINE AMBER (A) 01/20/2023 0720   APPEARANCEUR CLOUDY (A) 01/20/2023 0720   LABSPEC 1.010 01/20/2023 0720   PHURINE 6.0  01/20/2023 0720   GLUCOSEU NEGATIVE 01/20/2023 0720   HGBUR MODERATE (A) 01/20/2023 0720   BILIRUBINUR NEGATIVE 01/20/2023 0720   KETONESUR NEGATIVE 01/20/2023 0720   PROTEINUR 100 (A) 01/20/2023 0720   UROBILINOGEN 0.2 10/17/2013 1506   NITRITE NEGATIVE 01/20/2023 0720   LEUKOCYTESUR MODERATE (A) 01/20/2023 0720   Sepsis Labs: !!!!!!!!!!!!!!!!!!!!!!!!!!!!!!!!!!!!!!!!!!!! '@LABRCNTIP'$ (procalcitonin:4,lacticidven:4) )No results found for this or any previous visit (from the past 240 hour(s)).   Radiological Exams on Admission: DG Chest Port 1 View  Result Date: 01/20/2023 CLINICAL DATA:  Fever EXAM: PORTABLE CHEST 1 VIEW COMPARISON:  Chest x-ray dated November 24, 2022 FINDINGS: Cardiac and mediastinal contours are unchanged. Left basilar atelectasis. Lungs otherwise clear. No pleural effusion or pneumothorax. IMPRESSION: No active disease. Electronically Signed   By: Yetta Glassman M.D.   On: 01/20/2023 08:28     All images have been reviewed by me personally.  EKG: Independently reviewed.   Assessment/Plan Principal Problem:   Hematuria Active Problems:   Anemia due to acute blood loss   Urinary retention with incomplete bladder emptying   Cancer of prostate ([place])   AKI (acute kidney injury) ([place])   Hypokalemia   Metabolic acidosis   SIRS (systemic inflammatory response syndrome) (HCC)   Pyuria   Chronic indwelling Foley catheter   Primary hypertension   History of cerebrovascular accident (CVA) due to ischemia   Dementia without behavioral disturbance (HCC)     Assessment and Plan: Assessment Plan  # Hematuria in the setting of a chronic Foley catheter # Previous history of recurrent UTIs # History of urinary retention, bladder neck contracture # Remote history of prostate cancer  Patient presents from SNF with hematuria noted in his Foley catheter.  Per ED report, his Foley catheter was irrigated at the ED  -Telemetry monitoring given trend down hemoglobin - Trend  HH Q6h and PRBC transfusion if HGB < 7 or patient has symptomatic anemia -Foley was irrigated in the ED -CT abdomen without contrast pending given suspected UTI, AKI and hematuria.  -ED had discussed with urologist Dr. Louis Phillips.  The plan is to transfer patient to [facility] for Dr. Louis Phillips to evaluate patient. - hold home ASA and Plavix   # Acute blood loss anemia  -Hemoglobin 8.5 from baseline 14.7 -Denies dizziness or syncope -Hemodynamically stable -Continue to trend H&H every 6 hours and consider PRBC transfusion if hemoglobin less than 7 or patient has symptomatic  anemia   # Acute kidney injury # Metabolic acidosis # Hypokalemia  -K3.3, CO2 14, BUN 44, creatinine 1.44, -Likely related to AKI from decreased renal perfusion secondary to ABLA -Patient received normal saline bolus 2L at ED -Follow-up BMP  # SIRS # Pyuria  - P 99, initial BP 84/45 which improved to 131/47 after IV fluids -WBC 17.1 -He meets SIRS criteria -Blood and urine culture are pending -UA showing pyuria -Empiric antibiotics ceftriaxone daily   # HTN and HLD  - hold home HTN meds (Coreg) given normal BPS with lower than normal HGB   # recent Admission for CVA in Jan 2024  - continue statin - hold ASA and Plavix given hematuria and ABLA      # Dementia -Chronic and noted -Continue home Aricept and Seroquel        DVT prophylaxis: SCD Code Status: Full code after discussing with his son Trevor Phillips Family Communication: Trevor Phillips cell [phone] Consults called: ED had called Urology Dr. Louis Phillips Admission status: inpt  Status is: Inpatient Remains inpatient appropriate because: Patient requires minimum 2 night stay   Time Spent: 65 minutes.  >50% of the time was devoted to discussing the patients care, assessment, plan and disposition with other care givers along with counseling the patient about the risks and benefits of treatment.    Charlann Lange MD [facility]  If 7PM-7AM,  please contact night-coverage   01/20/2023, 8:46 AM

## 2023-01-20 NOTE — ED Provider Notes (Signed)
[name] Provider Note   CSN: [id number] Arrival date & time: 01/20/23  0032     History  Chief Complaint  Patient presents with   Hematuria    Pt BIBA from SNF with c/o blood in urine. Nurse at facility thinks patient displaced foley.     Trevor Phillips is a 89 y.o. male.  The history is provided by the nursing home. The history is limited by the condition of the patient (Dementia).  Hematuria  He has history of stroke, prostate cancer, neurogenic bladder with indwelling Foley catheter and was transferred from skilled nursing facility because of gross blood in his catheter.  Patient is not able to give any history.   Home Medications Prior to Admission medications   Medication Sig Start Date End Date Taking? Authorizing Provider  acetaminophen (TYLENOL) 325 MG tablet Take 2 tablets (650 mg total) by mouth every 4 (four) hours as needed for mild pain (or temp > 37.5 C (99.5 F)). 12/01/22   Cherene Altes, MD  aspirin 81 MG chewable tablet Chew 1 tablet (81 mg total) by mouth daily. 12/02/22   Cherene Altes, MD  atorvastatin (LIPITOR) 40 MG tablet Take 1 tablet (40 mg total) by mouth daily. 12/02/22   Cherene Altes, MD  carvedilol (COREG) 3.125 MG tablet Take 1 tablet (3.125 mg total) by mouth 2 (two) times daily with a meal. Patient not taking: Reported on 01/12/2023 12/01/22   Cherene Altes, MD  Cholecalciferol (VITAMIN D3) 2000 UNITS TABS Take 2,000 Units by mouth daily.    [provider]  clopidogrel (PLAVIX) 75 MG tablet Take 1 tablet (75 mg total) by mouth daily. 12/02/22 03/02/23  Cherene Altes, MD  donepezil (ARICEPT) 10 MG tablet Take 10 mg by mouth at bedtime.    [provider]  ezetimibe (ZETIA) 10 MG tablet Take 10 mg by mouth every evening.     [provider]  ferrous sulfate 325 (65 FE) MG EC tablet Take 325 mg by mouth daily with breakfast.    [provider]   methenamine (HIPREX) 1 G tablet Take 0.5 g by mouth 2 (two) times daily with a meal.    [provider]  QUEtiapine (SEROQUEL) 25 MG tablet Take 1 tablet (25 mg total) by mouth at bedtime. 12/01/22   Cherene Altes, MD      Allergies    Patient has no known allergies.    Review of Systems   Review of Systems  Unable to perform ROS: Dementia  Genitourinary:  Positive for hematuria.    Physical Exam Updated Vital Signs BP (!) 114/56 (BP Location: Right Arm)   Pulse 99   Temp 97.8 F (36.6 C) (Oral)   Resp 17   SpO2 94%  Physical Exam Vitals and nursing note reviewed.   89 year old male, resting comfortably and in no acute distress. Vital signs are normal. Oxygen saturation is 94%, which is normal. Head is normocephalic and atraumatic. PERRLA, EOMI. Oropharynx is clear.  Mucous membranes are dry. Neck is nontender and supple without adenopathy or JVD. Back is nontender and there is no CVA tenderness. Lungs are clear without rales, wheezes, or rhonchi. Chest is nontender. Heart has regular rate and rhythm without murmur. Abdomen is soft, flat, nontender. Genitalia: Foley catheter in place draining what appears to be pure blood. Extremities have no cyanosis or edema, full range of motion is present. Skin is warm and  dry without rash. Neurologic: Awake but only minimally verbal, not oriented.  Moves all extremities equally.  ED Results / Procedures / Treatments   Labs (all labs ordered are listed, but only abnormal results are displayed) Labs Reviewed  URINALYSIS, W/ REFLEX TO CULTURE (INFECTION SUSPECTED)  BASIC METABOLIC PANEL  CBC WITH DIFFERENTIAL/PLATELET  PROTIME-INR    EKG EKG Interpretation  Date/Time:  Wednesday January 20 2023 00:35:50 EDT Ventricular Rate:  86 PR Interval:  125 QRS Duration: 85 QT Interval:  410 QTC Calculation: 491 R Axis:   10 Text Interpretation: Sinus rhythm Atrial premature complexes in couplets Borderline T  abnormalities, diffuse leads Borderline prolonged QT interval When compared with ECG of 11/24/2022, Premature atrial complexes are now present Confirmed by Delora Fuel ([id number]) on 01/20/2023 12:46:33 AM  Radiology No results found.  Procedures Procedures  Cardiac monitor shows normal sinus rhythm, per my interpretation.  Medications Ordered in ED Medications  sodium chloride 0.9 % bolus 1,000 mL (has no administration in time range)    ED Course/ Medical Decision Making/ A&P                             Medical Decision Making Amount and/or Complexity of Data Reviewed Labs: ordered.  Risk Decision regarding hospitalization.   Gross hematuria and patient with chronic indwelling Foley catheter.  Consider occult urinary infection, catheter induced trauma, coagulopathy.  I have ordered bladder irrigation and workup including CBC and basic metabolic panel and urinalysis with culture.  Patient appears somewhat dry on exam, I have ordered IV fluids.  Urine has not completely cleared with irrigation.  I have reviewed and interpreted the patient's laboratory tests, and my interpretation is significant drop in hemoglobin.  Hemoglobin today 8.5 compared with 14.7 on 12/01/2022.  Also, stable renal insufficiency, mild hypokalemia, chronic metabolic acidosis.  I have discussed case with Dr. Louis Meckel of urology service who requests the patient be admitted to internal medicine service and be moved to [facility] where he can evaluate the patient and, if necessary, take him to the operating room.  I have discussed the case with Dr. Nicoletta Dress of [facility], who agrees to admit the patient.  Final Clinical Impression(s) / ED Diagnoses Final diagnoses:  Gross hematuria  Anemia due to acute blood loss  Renal insufficiency  Hypokalemia    Rx / DC Orders ED Discharge Orders     None         Delora Fuel, MD [id number]

## 2023-01-20 NOTE — Consult Note (Addendum)
WOC Nurse Consult Note: Reason for Consult:Right heel unstageable pressure injury, present on admission Patient is confused to place and time.  I assist him to roll over to assess buttocks.  Moisture associated skin damage to upper gluteal fold and sacrum. Will implement gerhardts butt paste.  Avoid disposable briefs and underpads.  Wound type:pressure injury Pressure Injury POA: Yes Measurement: 2 cm x 1.5 cm slough to wound bed Wound bed:100% devitalized tissue Drainage (amount, consistency, odor) minimal serosanguinous Periwound:intact Dressing procedure/placement/frequency:Cleanse right heel with NS and apply medihoney.  Cover with gauze and silicone heel dressing. Peel back foam and change daily.  Change heel foam every three days Prevalon boots (place patient boots in belongings bag as they are not offloading) Will not follow at this time.  Please re-consult if needed.  Estrellita Ludwig MSN, RN, FNP-BC CWON Wound, Ostomy, Continence Nurse [facility] [phone] Pager [phone]

## 2023-01-20 NOTE — Plan of Care (Signed)
Received a message from urology Dr, Louis Meckel. He would like patient to be admitted to [facility] now. I spoke to flow manager who asked me to placed an new admission order, which I did.   Charlann Lange MD 2:59 PM

## 2023-01-20 NOTE — Consult Note (Signed)
I have been asked to see the patient by Dr. Delora Fuel, for evaluation and management of gross hematuria.  History of present illness: 89 year old male was transferred from a skilled nursing facility to the emergency department for concern of worsening hematuria.  The patient has a long history of urologic issues and is status post prostatectomy with subsequent bladder neck contraction.  He was seen in 2017 for gross hematuria and noted to have a dense bladder neck contracture and some abnormalities along the bladder wall.  These were all biopsied and were normal.  The areas were fulgurated.  He did reasonably well following that.  The patient was most recently seen in the hospital on the last admission by urology for urinary retention.  The bladder neck was dilated and a 12 French Foley catheter was placed.  The catheter was subsequently removed.  He was returned back to the skilled nursing facility and then followed up in clinic.  In clinic he was noted to be again in urinary retention.  At that time they recommended that he be cath once daily.  Notes beyond this are unclear.  However, the patient did present to the emergency department with a Foley catheter.  In the emergency department the patient was noted to have a drop in his hemoglobin.  His catheter was irrigated several times by the ER nurses.  The urine did clear.  He currently has a 16 [place] Foley catheter in.  The patient's history is otherwise unobtainable as there is no one in the room with him that knows him and he has dementia.  Review of systems: A 12 point comprehensive review of systems was obtained and is negative unless otherwise stated in the history of present illness.  Patient Active Problem List   Diagnosis Date Noted   Hypokalemia [id number]   Metabolic acidosis [id number]   SIRS (systemic inflammatory response syndrome) ([place]) 01/20/2023   Pyuria 01/20/2023   Chronic indwelling Foley catheter 01/20/2023   Primary  hypertension 01/20/2023   History of cerebrovascular accident (CVA) due to ischemia 01/20/2023   Dementia without behavioral disturbance ([place]) 01/20/2023   CVA (cerebral vascular accident) ([place]) 11/24/2022   AKI (acute kidney injury) ([place]) 11/24/2022   Elevated BP without diagnosis of hypertension 11/24/2022   Acute urinary retention 11/24/2022   Anemia due to acute blood loss 07/11/2016   Urinary retention with incomplete bladder emptying    Diverticular disease    Hematuria    Cancer of prostate ([place])     No current facility-administered medications on file prior to encounter.   Current Outpatient Medications on File Prior to Encounter  Medication Sig Dispense Refill   acetaminophen (TYLENOL) 325 MG tablet Take 2 tablets (650 mg total) by mouth every 4 (four) hours as needed for mild pain (or temp > 37.5 C (99.5 F)).     aspirin 81 MG chewable tablet Chew 1 tablet (81 mg total) by mouth daily.     atorvastatin (LIPITOR) 40 MG tablet Take 1 tablet (40 mg total) by mouth daily.     carvedilol (COREG) 3.125 MG tablet Take 1 tablet (3.125 mg total) by mouth 2 (two) times daily with a meal. (Patient not taking: Reported on 01/12/2023)     Cholecalciferol (VITAMIN D3) 2000 UNITS TABS Take 2,000 Units by mouth daily.     clopidogrel (PLAVIX) 75 MG tablet Take 1 tablet (75 mg total) by mouth daily.     donepezil (ARICEPT) 10 MG tablet Take 10 mg by mouth at bedtime.  ezetimibe (ZETIA) 10 MG tablet Take 10 mg by mouth every evening.      ferrous sulfate 325 (65 FE) MG EC tablet Take 325 mg by mouth daily with breakfast.     methenamine (HIPREX) 1 G tablet Take 0.5 g by mouth 2 (two) times daily with a meal.     QUEtiapine (SEROQUEL) 25 MG tablet Take 1 tablet (25 mg total) by mouth at bedtime.      Past Medical History:  Diagnosis Date   Anemia due to acute blood loss    gross hematuria   Bladder neck contracture    chronic   Diverticulosis of colon    Foley catheter in place     placed 07-11-2016   Gross hematuria    History of cerebral infarction    per CT 05-18-2012  chronic lacunar infarcts in basal ganglia  (silent?,  pt denies history S&S)   History of colon polyps    2004   History of diverticulitis    2014   History of prostate cancer 2003   s/p  radical prostatectomy 03-21-2002--  no recurrence (last PSA 07/2016  0.01)   History of recurrent UTIs    History of urinary retention    Neurogenic bladder    since prostatectomy 2003   Self-catheterizes urinary bladder    SECONDARY TO NEUROGENIC BLADDER    Urinary retention    Wears glasses     Past Surgical History:  Procedure Laterality Date   CATARACT EXTRACTION W/ INTRAOCULAR LENS  IMPLANT, BILATERAL  2010   CYSTO/  DILATATION BLADDER NECK CONTRACTURE  06/02/2002   CYSTOSCOPY W/ RETROGRADES  07/21/2016   Procedure: CYSTOSCOPY WITH RETROGRADE PYELOGRAM, CLOT EVACUATION, BLADDER BIOPSY WITH FULGERATION;  Surgeon: Festus Aloe, MD;  Location: [facility];  Service: Urology;;  0.5 TO 2 CM    EYE SURGERY Right yrs ago   PROSTATE SURGERY  yrs ago   [place] /  REPAIR RECTAL PERFORATION  03-21-2002  dr Jori Moll davis   TRANSURETHRAL RESECTION OF PROSTATE  09/06/2001    Social History   Tobacco Use   Smoking status: Former    Packs/day: 1.00    Years: 8.00    Total pack years: 8.00    Types: Cigarettes    Quit date: 07/16/1957    Years since quitting: 65.5   Smokeless tobacco: Former    Types: Snuff    Quit date: 07/17/1979  Substance Use Topics   Alcohol use: Yes    Comment: "very little"   Drug use: No    History reviewed. No pertinent family history.  PE: Vitals:   01/20/23 1245 01/20/23 1300 01/20/23 1330 01/20/23 1438  BP:  127/65 (!) 134/56   Pulse:  83 86   Resp: 19 12 (!) 9   Temp:    98.8 F (37.1 C)  TempSrc:    Oral  SpO2:  98% 95%    Patient appears to be in no acute distress  patient is alert and quite disoriented Atraumatic  normocephalic head No cervical or supraclavicular lymphadenopathy appreciated No increased work of breathing, no audible wheezes/rhonchi Regular sinus rhythm/rate Abdomen is soft, nontender, nondistended, no CVA or suprapubic tenderness Urine in the Foley catheter bag is brown, but thin Lower extremities are symmetric without appreciable edema Grossly neurologically intact No identifiable skin lesions  Recent Labs    01/20/23 0133 01/20/23 0805  WBC 17.1* 16.1*  HGB 8.5* 9.1*  HCT 26.7* 29.4*   Recent Labs  01/20/23 0357  NA 142  K 3.3*  CL 120*  CO2 14*  GLUCOSE 83  BUN 44*  CREATININE 1.44*  CALCIUM 6.5*   Recent Labs    01/20/23 0133  INR 1.4*   No results for input(s): "LABURIN" in the last 72 hours. Results for orders placed or performed during the hospital encounter of 01/20/23  Urine Culture     Status: None (Preliminary result)   Collection Time: 01/20/23  7:20 AM   Specimen: Urine, Catheterized  Result Value Ref Range Status   Specimen Description URINE, CATHETERIZED  Final   Special Requests   Final    NONE Reflexed from [id number] Performed at [facility], [place] [address] [place], [place] [address]    Culture PENDING  Incomplete   Report Status PENDING  Incomplete    Imaging: none  Imp: The patient has gross hematuria, likely secondary to a urinary tract infection.  He does have chronic urinary retention from a longstanding bladder neck contracture that has been dilated as recently as 2 months ago.  However, he is unable to void on his own after that.  It appears that a Foley catheter was placed at the skilled nursing facility, I am not sure how long that it has been in.  It looks fairly new and clean.  I am reluctant to put in a larger catheter because of his long history of bladder neck contracture.  Fortunately, the bleeding does not appear to be ongoing, the Foley is now draining brown urine which is fairly thin and no  clots.  Recommendations: I suspect the reason for his gross hematuria is related to his chronic indwelling Foley and likely bacteriuria.  This is a very common event.  Fortunately his urine is thin and at this time I do not think he needs any additional management.  I would just continue to monitor it, give him IV fluids and treat him for infection.  He should have his Foley catheter changed every 3 or 4 weeks.  Following discharge we can follow-up with him in our clinic to help facilitate catheter changes and further management.   Thank you for involving me in this patient's care, Please page with any further questions or concerns. Ardis Hughs

## 2023-01-20 NOTE — ED Notes (Signed)
ED TO INPATIENT HANDOFF REPORT  ED Nurse Name and Phone #: Leafy Ro, RN  S Name/Age/Gender Trevor Phillips 89 y.o. male Room/Bed: 045C/045C  Code Status   Code Status: Full Code  Home/SNF/Other Skilled nursing facility Patient oriented to: self Is this baseline? Yes   Triage Complete: Triage complete  Chief Complaint Hematuria [R31.9]  Triage Note No notes on file   Allergies No Known Allergies  Level of Care/Admitting Diagnosis ED Disposition     ED Disposition  Admit   Condition  --   [place]: [facility] [id number]  Level of Care: Telemetry Medical [104]  May admit patient to [facility] or [facility] if equivalent level of care is available:: No  Covid Evaluation: Asymptomatic - no recent exposure (last 10 days) testing not required  Diagnosis: Hematuria [id number]  Admitting Physician: Nicoletta Dress, [place]  Attending Physician: Nicoletta Dress, NA [id number]  Certification:: I certify this patient will need inpatient services for at least 2 midnights          B Medical/Surgery History Past Medical History:  Diagnosis Date   Anemia due to acute blood loss    gross hematuria   Bladder neck contracture    chronic   Diverticulosis of colon    Foley catheter in place    placed 07-11-2016   Gross hematuria    History of cerebral infarction    per CT 05-18-2012  chronic lacunar infarcts in basal ganglia  (silent?,  pt denies history S&S)   History of colon polyps    2004   History of diverticulitis    2014   History of prostate cancer 2003   s/p  radical prostatectomy 03-21-2002--  no recurrence (last PSA 07/2016  0.01)   History of recurrent UTIs    History of urinary retention    Neurogenic bladder    since prostatectomy 2003   Self-catheterizes urinary bladder    SECONDARY TO NEUROGENIC BLADDER    Urinary retention    Wears glasses    Past Surgical History:  Procedure Laterality Date   CATARACT EXTRACTION W/ INTRAOCULAR LENS  IMPLANT,  BILATERAL  2010   CYSTO/  DILATATION BLADDER NECK CONTRACTURE  06/02/2002   CYSTOSCOPY W/ RETROGRADES  07/21/2016   Procedure: CYSTOSCOPY WITH RETROGRADE PYELOGRAM, CLOT EVACUATION, BLADDER BIOPSY WITH FULGERATION;  Surgeon: Festus Aloe, MD;  Location: [facility];  Service: Urology;;  0.5 TO 2 CM    EYE SURGERY Right yrs ago   PROSTATE SURGERY  yrs ago   [place] /  [place]  03-21-2002  dr Jori Moll davis   TRANSURETHRAL RESECTION OF PROSTATE  09/06/2001     A IV Location/Drains/Wounds Patient Lines/Drains/Airways Status     Active Line/Drains/Airways     Name Placement date Placement time Site Days   Peripheral IV 11/26/22 20 G Right;Upper Arm 11/26/22  0513  Arm  55   Peripheral IV 01/20/23 20 G 1" Right Antecubital 01/20/23  0843  Antecubital  less than 1            Intake/Output Last 24 hours  Intake/Output Summary (Last 24 hours) at 01/20/2023 1646 Last data filed at 01/20/2023 [id number] Gross per 24 hour  Intake 2100 ml  Output --  Net 2100 ml    Labs/Imaging Results for orders placed or performed during the hospital encounter of 01/20/23 (from the past 48 hour(s))  CBC with Differential     Status: Abnormal   Collection Time:  01/20/23  1:33 AM  Result Value Ref Range   WBC 17.1 (H) 4.0 - 10.5 K/uL   RBC 2.67 (L) 4.22 - 5.81 MIL/uL   Hemoglobin 8.5 (L) 13.0 - 17.0 g/dL   HCT 26.7 (L) 39.0 - 52.0 %   MCV 100.0 80.0 - 100.0 fL   MCH 31.8 26.0 - 34.0 pg   MCHC 31.8 30.0 - 36.0 g/dL   RDW 14.3 11.5 - 15.5 %   Platelets 360 150 - 400 K/uL   nRBC 0.0 0.0 - 0.2 %   Neutrophils Relative % 77 %   Neutro Abs 13.4 (H) 1.7 - 7.7 K/uL   Lymphocytes Relative 12 %   Lymphs Abs 2.0 0.7 - 4.0 K/uL   Monocytes Relative 6 %   Monocytes Absolute 1.0 0.1 - 1.0 K/uL   Eosinophils Relative 2 %   Eosinophils Absolute 0.3 0.0 - 0.5 K/uL   Basophils Relative 1 %   Basophils Absolute 0.1 0.0 - 0.1 K/uL   Immature Granulocytes  2 %   Abs Immature Granulocytes 0.33 (H) 0.00 - 0.07 K/uL    Comment: Performed at [facility], [address] [place], [place] [address]  Protime-INR     Status: Abnormal   Collection Time: 01/20/23  1:33 AM  Result Value Ref Range   Prothrombin Time 17.2 (H) 11.4 - 15.2 seconds   INR 1.4 (H) 0.8 - 1.2    Comment: (NOTE) INR goal varies based on device and disease states. Performed at [facility], [place] [address], [place], [place] [id number]   Basic metabolic panel     Status: Abnormal   Collection Time: 01/20/23  3:57 AM  Result Value Ref Range   Sodium 142 135 - 145 mmol/L   Potassium 3.3 (L) 3.5 - 5.1 mmol/L   Chloride 120 (H) 98 - 111 mmol/L   CO2 14 (L) 22 - 32 mmol/L   Glucose, Bld 83 70 - 99 mg/dL    Comment: Glucose reference range applies only to samples taken after fasting for at least 8 hours.   BUN 44 (H) 8 - 23 mg/dL   Creatinine, Ser 1.44 (H) 0.61 - 1.24 mg/dL   Calcium 6.5 (L) 8.9 - 10.3 mg/dL   GFR, Estimated 46 (L) >60 mL/min    Comment: (NOTE) Calculated using the CKD-EPI Creatinine Equation (2021)    Anion gap 8 5 - 15    Comment: Performed at [facility] [address] [place], [place] [address]  Type and screen [place]     Status: None   Collection Time: 01/20/23  4:36 AM  Result Value Ref Range   ABO/RH(D) O POS    Antibody Screen NEG    Sample Expiration      01/23/2023,2359 Performed at [facility], [place] [address], [place], [place] [address]   Urinalysis, w/ Reflex to Culture (Infection Suspected) -Urine, Catheterized     Status: Abnormal   Collection Time: 01/20/23  7:20 AM  Result Value Ref Range   Specimen Source URINE, CATHETERIZED    Color, Urine AMBER (A) YELLOW    Comment: BIOCHEMICALS MAY BE AFFECTED BY COLOR   APPearance CLOUDY (A) CLEAR   Specific Gravity, Urine 1.010 1.005 - 1.030   pH 6.0 5.0 - 8.0   Glucose, UA NEGATIVE NEGATIVE mg/dL   Hgb urine dipstick MODERATE (A) NEGATIVE    Bilirubin Urine NEGATIVE NEGATIVE   Ketones, ur NEGATIVE NEGATIVE mg/dL  Protein, ur 100 (A) NEGATIVE mg/dL   Nitrite NEGATIVE NEGATIVE   Leukocytes,Ua MODERATE (A) NEGATIVE   RBC / HPF >50 0 - 5 RBC/hpf   WBC, UA >50 0 - 5 WBC/hpf    Comment:        Reflex urine culture not performed if WBC <=10, OR if Squamous epithelial cells >5. If Squamous epithelial cells >5 suggest recollection.    Bacteria, UA FEW (A) NONE SEEN   Squamous Epithelial / HPF 0-5 0 - 5 /HPF    Comment: Performed at [facility], [place] [address], [place], [place] [address]  Urine Culture     Status: None (Preliminary result)   Collection Time: 01/20/23  7:20 AM   Specimen: Urine, Catheterized  Result Value Ref Range   Specimen Description URINE, CATHETERIZED    Special Requests      NONE Reflexed from [id number] Performed at [facility], [place] [address] [place], [place] [address]    Culture PENDING    Report Status PENDING   Lactic acid, plasma     Status: None   Collection Time: 01/20/23  7:42 AM  Result Value Ref Range   Lactic Acid, Venous 1.4 0.5 - 1.9 mmol/L    Comment: Performed at [facility] [address] [place], [place] [address]  CBC     Status: Abnormal   Collection Time: 01/20/23  8:05 AM  Result Value Ref Range   WBC 16.1 (H) 4.0 - 10.5 K/uL   RBC 2.93 (L) 4.22 - 5.81 MIL/uL   Hemoglobin 9.1 (L) 13.0 - 17.0 g/dL   HCT 29.4 (L) 39.0 - 52.0 %   MCV 100.3 (H) 80.0 - 100.0 fL   MCH 31.1 26.0 - 34.0 pg   MCHC 31.0 30.0 - 36.0 g/dL   RDW 14.3 11.5 - 15.5 %   Platelets 393 150 - 400 K/uL   nRBC 0.0 0.0 - 0.2 %    Comment: Performed at [facility], [place] [address] [place], [place] [address]   CT ABDOMEN PELVIS WO CONTRAST  Result Date: 01/20/2023 CLINICAL DATA:  89 year old male with history of hematuria and urinary tract infection. EXAM: CT ABDOMEN AND PELVIS WITHOUT CONTRAST TECHNIQUE: Multidetector CT imaging of the abdomen and pelvis was performed following the  standard protocol without IV contrast. RADIATION DOSE REDUCTION: This exam was performed according to the departmental dose-optimization program which includes automated exposure control, adjustment of the mA and/or kV according to patient size and/or use of iterative reconstruction technique. COMPARISON:  05/31/2016 FINDINGS: Lower chest: Rounded nodular opacity in the lingula measuring up to 1 cm. Dependent bibasilar subsegmental atelectasis. Hepatobiliary: No focal liver abnormality is seen. No gallstones, gallbladder wall thickening, or biliary dilatation. Pancreas: Fatty atrophy. No ductal dilation or evidence of focal mass. Spleen: Normal in size without focal abnormality. Adrenals/Urinary Tract: Adrenal glands are unremarkable. Punctate nephrolithiasis in the lower pole as well as in the proximal left ureter. There is mild left hydronephrosis. Mild left perinephric fat stranding. No evidence of hydroureter. Foley catheter is in place with complete evacuation of the urinary bladder. The left kidney is within normal limits without evidence of nephrolithiasis or hydronephrosis. Bladder is unremarkable. Stomach/Bowel: Stomach is within normal limits. Appendix is not definitively identified. Large rectal stool ball. Few scattered sigmoid diverticula without surrounding inflammatory changes. No evidence of bowel wall thickening, distention, or inflammatory changes. Vascular/Lymphatic: Aortic atherosclerosis. No enlarged abdominal or pelvic lymph nodes. Reproductive: Status post prostatectomy. Other:  No abdominal wall hernia or abnormality. No abdominopelvic ascites. Musculoskeletal: No acute osseous abnormality. Similar appearing bone island in the S1 vertebral body. IMPRESSION: 1. Partially obstructive punctate left nephrolithiasis in the proximal left ureter. 2. Large rectal stool ball. 3. Rounded nodular opacity in the lingula measuring up to 1 cm. Consider one of the following in 3 months for both low-risk and  high-risk individuals: (a) repeat chest CT, (b) follow-up PET-CT, or (c) tissue sampling. This recommendation follows the consensus statement: Guidelines for Management of Incidental Pulmonary Nodules Detected on CT Images: From the Fleischner Society 2017; Radiology 2017; 284:228-243. 4. Aortic atherosclerosis. Aortic Atherosclerosis (ICD10-I70.0). Ruthann Cancer, MD [facility] Electronically Signed   By: Ruthann Cancer M.D.   On: 01/20/2023 08:45   DG Chest Port 1 View  Result Date: 01/20/2023 CLINICAL DATA:  Fever EXAM: PORTABLE CHEST 1 VIEW COMPARISON:  Chest x-ray dated November 24, 2022 FINDINGS: Cardiac and mediastinal contours are unchanged. Left basilar atelectasis. Lungs otherwise clear. No pleural effusion or pneumothorax. IMPRESSION: No active disease. Electronically Signed   By: Yetta Glassman M.D.   On: 01/20/2023 08:28    Pending Labs Unresulted Labs (From admission, onward)     Start     Ordered   01/21/23 0500  CBC  Tomorrow morning,   R        01/20/23 0810   01/21/23 [id number]  Basic metabolic panel  Tomorrow morning,   R        01/20/23 0810   01/20/23 1400  Hemoglobin and hematocrit, blood  Every 6 hours,   R (with TIMED occurrences)      01/20/23 0743   01/20/23 0800  Comprehensive metabolic panel  ONCE - STAT,   STAT        01/20/23 0759   01/20/23 0742  Culture, blood (Routine X 2) w Reflex to ID Panel  BLOOD CULTURE X 2,   R (with TIMED occurrences)      01/20/23 0742   01/20/23 0742  Lactic acid, plasma  STAT Now then every 3 hours,   R (with STAT occurrences)      01/20/23 0742   01/20/23 0000  Hemoglobin and Hematocrit, Blood  R        01/20/23 0616            Vitals/Pain Today's Vitals   01/20/23 1245 01/20/23 1300 01/20/23 1330 01/20/23 1438  BP:  127/65 (!) 134/56   Pulse:  83 86   Resp: 19 12 (!) 9   Temp:    98.8 F (37.1 C)  TempSrc:    Oral  SpO2:  98% 95%     Isolation Precautions No  active isolations  Medications Medications  0.9 %  sodium chloride infusion ( Intravenous New Bag/Given 01/20/23 0848)  cefTRIAXone (ROCEPHIN) 1 g in sodium chloride 0.9 % 100 mL IVPB (0 g Intravenous Stopped 01/20/23 0936)  methenamine (HIPREX) tablet 0.5 g (has no administration in time range)  atorvastatin (LIPITOR) tablet 40 mg (40 mg Oral Given 01/20/23 1048)  ezetimibe (ZETIA) tablet 10 mg (has no administration in time range)  donepezil (ARICEPT) tablet 10 mg (has no administration in time range)  QUEtiapine (SEROQUEL) tablet 25 mg (has no administration in time range)  ferrous sulfate tablet 325 mg (325 mg Oral Given 01/20/23 1048)  acetaminophen (TYLENOL) tablet 650 mg (has no administration in time range)    Or  acetaminophen (TYLENOL) suppository 650 mg (has no administration in time  range)  ondansetron (ZOFRAN) tablet 4 mg (has no administration in time range)    Or  ondansetron (ZOFRAN) injection 4 mg (has no administration in time range)  leptospermum manuka honey (MEDIHONEY) paste 1 Application (1 Application Topical Not Given 01/20/23 1635)  Gerhardt's butt cream ( Topical Not Given 01/20/23 1634)  sodium chloride 0.9 % bolus 1,000 mL (0 mLs Intravenous Stopped 01/20/23 0424)  sodium chloride 0.9 % bolus 1,000 mL (0 mLs Intravenous Stopped 01/20/23 0626)    Mobility Unsure if he walks      Focused Assessments Urinary, blood in his urine   R Recommendations: See Admitting Provider Note  Report given to:   Additional Notes: Pt will follow commands, knows who he is, gets tearful and scared, very sweet demeanor,

## 2023-01-20 NOTE — ED Notes (Signed)
Performed foley irrigation. Blood tinged urine has improved and is light pink. Foley care performed

## 2023-01-21 DIAGNOSIS — R310 Gross hematuria: Secondary | ICD-10-CM | POA: Diagnosis not present

## 2023-01-21 LAB — CBC
HCT: 26.7 % — ABNORMAL LOW (ref 39.0–52.0)
Hemoglobin: 8.5 g/dL — ABNORMAL LOW (ref 13.0–17.0)
MCH: 31.4 pg (ref 26.0–34.0)
MCHC: 31.8 g/dL (ref 30.0–36.0)
MCV: 98.5 fL (ref 80.0–100.0)
Platelets: 333 10*3/uL (ref 150–400)
RBC: 2.71 MIL/uL — ABNORMAL LOW (ref 4.22–5.81)
RDW: 14.1 % (ref 11.5–15.5)
WBC: 13.1 10*3/uL — ABNORMAL HIGH (ref 4.0–10.5)
nRBC: 0 % (ref 0.0–0.2)

## 2023-01-21 LAB — BASIC METABOLIC PANEL
Anion gap: 7 (ref 5–15)
BUN: 38 mg/dL — ABNORMAL HIGH (ref 8–23)
CO2: 19 mmol/L — ABNORMAL LOW (ref 22–32)
Calcium: 7.7 mg/dL — ABNORMAL LOW (ref 8.9–10.3)
Chloride: 118 mmol/L — ABNORMAL HIGH (ref 98–111)
Creatinine, Ser: 1.44 mg/dL — ABNORMAL HIGH (ref 0.61–1.24)
GFR, Estimated: 46 mL/min — ABNORMAL LOW (ref >60)
Glucose, Bld: 79 mg/dL (ref 70–99)
Potassium: 3.4 mmol/L — ABNORMAL LOW (ref 3.5–5.1)
Sodium: 144 mmol/L (ref 135–145)

## 2023-01-21 LAB — URINE CULTURE: Culture: NO GROWTH

## 2023-01-21 MED ORDER — CHLORHEXIDINE GLUCONATE CLOTH 2 % EX PADS
6.0000 | MEDICATED_PAD | Freq: Every day | CUTANEOUS | Status: DC
  Administered 2023-01-21 – 2023-01-25 (×5): 6 via TOPICAL

## 2023-01-21 MED ORDER — METHENAMINE MANDELATE 0.5 G PO TABS
500.0000 mg | ORAL_TABLET | Freq: Two times a day (BID) | ORAL | Status: DC
  Administered 2023-01-21 – 2023-01-25 (×10): 500 mg via ORAL
  Filled 2023-01-21 (×12): qty 1

## 2023-01-21 MED ORDER — FLEET ENEMA 7-19 GM/118ML RE ENEM: ENEMA | Freq: Every day | RECTAL | Status: DC | PRN

## 2023-01-21 MED ORDER — MAGNESIUM HYDROXIDE 400 MG/5ML PO SUSP: 15.0000 mL | Freq: Every day | ORAL | Status: DC | PRN

## 2023-01-21 MED ORDER — SODIUM CHLORIDE 0.9 % IV SOLN: INTRAVENOUS | Status: DC

## 2023-01-21 MED ORDER — BISACODYL 10 MG RE SUPP: 10.0000 mg | Freq: Every day | RECTAL | Status: DC | PRN

## 2023-01-21 MED ORDER — POTASSIUM CHLORIDE CRYS ER 20 MEQ PO TBCR
40.0000 meq | EXTENDED_RELEASE_TABLET | Freq: Once | ORAL | Status: AC
  Administered 2023-01-21: 40 meq via ORAL
  Filled 2023-01-21: qty 2

## 2023-01-21 MED ORDER — MELATONIN 5 MG PO TABS
5.0000 mg | ORAL_TABLET | Freq: Every day | ORAL | Status: DC
  Administered 2023-01-21 – 2023-01-24 (×4): 5 mg via ORAL
  Filled 2023-01-21 (×4): qty 1

## 2023-01-21 NOTE — Progress Notes (Signed)
Mobility Specialist Progress Note   01/21/23 1540  Mobility  Activity Transferred from chair to bed  Level of Assistance Moderate assist, patient does 50-74%  Assistive Device Front wheel walker  Distance Ambulated (ft) 2 ft  Activity Response Tolerated well  Mobility Referral Yes  $Mobility charge 1 Mobility   Pt found in chair requesting to get back to bed d/t fatigue. Able to stand w/ modA + heavy verbal cues on redirection, hand and foot placement and direction when getting back to bed. No faults on transfer, left in bed w/ call bell in reach and bed alarm today.   Holland Falling Mobility Specialist Please contact via SecureChat or  Rehab office at [phone]

## 2023-01-21 NOTE — Progress Notes (Signed)
Foley was present on assessment. Just placed on LDA.

## 2023-01-21 NOTE — Progress Notes (Addendum)
Trevor Phillips  [id number] DOB: August 27, 1931 DOA: 01/20/2023 PCP: Lajean Manes, MD    Brief Narrative:  89 year old SNF resident with a history of dementia, chronic Foley catheter due to retention with recurrent UTIs, prostate cancer status post radical prostatectomy, and diverticulosis who was sent to the ER from his SNF when he was found to have gross hematuria.  In the ER urinalysis revealed pyuria.  Consultants:  None  Goals of Care:  Code Status: Full Code   DVT prophylaxis: SCDs  Interim Hx: Afebrile.  Vital signs stable.  Hemoglobin appears to be stable.  Happily eating at the time of my visit.  Denies any new complaints.  Is alert and pleasant.  Assessment & Plan:  Hematuria in setting of chronic Foley catheter Likely related to UTI -no major clotting/obstruction encountered - case reviewed with urology with no need for acute intervention presently -to follow-up with Dr. 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 and Foley catheter dependence Follow-up as noted above -will need his Foley catheter changed every 3-4 weeks, to be coordinated through the Urology office  Acute blood loss anemia Baseline hemoglobin approximately 14 -hemoglobin appears to be stabilizing at present -hold ASA and Plavix  Acute kidney injury Likely prerenal azotemia -monitor trend with hydration  Hypokalemia Supplement and monitor  HTN Blood pressure currently well-controlled  CVA January 2024 Continue statin for now -ASA and Plavix on hold given hematuria  Dementia Continue usual Aricept and Seroquel  Rounded 1cm nodular opacity in the lingula  Given advanced age and cognitive deficit further evaluation is not clinically indicated  R heel pressure ulcer POA Noted by WOC - unstageable - cleanse right heel with NS and apply  medihoney > over with gauze and silicone heel dressing - peel back foam and change daily - change heel foam every three days   Family Communication: No family present at time of visit Disposition: From SNF -will return to SNF at discharge   Objective: Blood pressure (!) 102/50, pulse 82, temperature 98 F (36.7 C), temperature source Oral, resp. rate 20, weight 52.9 kg, SpO2 96 %.  Intake/Output Summary (Last 24 hours) at 01/21/2023 0920 Last data filed at 01/21/2023 0616 Gross per 24 hour  Intake 1710 ml  Output 1150 ml  Net 560 ml   Filed Weights   01/21/23 0426  Weight: 52.9 kg    Examination: General: No acute respiratory distress Lungs: Clear to auscultation bilaterally without wheezes or crackles Cardiovascular: Regular rate and rhythm without murmur gallop or rub normal S1 and S2 Abdomen: Nontender, nondistended, soft, bowel sounds positive, no rebound, no ascites, no appreciable mass Extremities: No significant cyanosis, clubbing, or edema bilateral lower extremities  CBC: Recent Labs  Lab 01/20/23 0133 01/20/23 0805 01/20/23 1950 01/21/23 0241  WBC 17.1* 16.1*  --  13.1*  NEUTROABS 13.4*  --   --   --   HGB 8.5* 9.1* 10.2* 8.5*  HCT 26.7* 29.4* 31.5* 26.7*  MCV 100.0 100.3*  --  98.5  PLT 360 393  --  0000000   Basic Metabolic Panel: Recent Labs  Lab 01/20/23 0357 01/20/23 1950 01/21/23 0241  NA 142 143 144  K 3.3* 3.5 3.4*  CL 120* 114* 118*  CO2 14* 18* 19*  GLUCOSE 83 87 79  BUN 44* 39* 38*  CREATININE 1.44* 1.47* 1.44*  CALCIUM 6.5* 8.1* 7.7*   GFR: Estimated Creatinine Clearance: 26 mL/min (A) (by C-G formula based on SCr of 1.44 mg/dL (H)).   Scheduled Meds:  atorvastatin  40 mg Oral Daily   Chlorhexidine Gluconate Cloth  6 each Topical Q0600   donepezil  10 mg Oral QHS   ezetimibe  10 mg Oral QPM   ferrous sulfate  325 mg Oral Q breakfast   Gerhardt's butt cream   Topical BID   leptospermum manuka honey  1 Application Topical Daily    methenamine  500 mg Oral BID WC   QUEtiapine  25 mg Oral QHS   Continuous Infusions:  cefTRIAXone (ROCEPHIN)  IV 1 g (01/21/23 0817)     LOS: 1 day   Cherene Altes, MD [facility] Office  [phone] Pager - Text Page per Shea Evans  If 7PM-7AM, please contact night-coverage per Amion 01/21/2023, 9:20 AM

## 2023-01-21 NOTE — Evaluation (Signed)
Physical Therapy Evaluation Patient Details Name: Trevor Phillips MRN: [id number] DOB: 01-Nov-1931 Today's Date: 01/21/2023  History of Present Illness  89 year old male was transferred from a skilled nursing facility to the emergency department 3/13 for concern of worsening hematuria.  The patient has a long history of urologic issues and is status post prostatectomy with subsequent bladder neck contraction. PMH: CVA  Clinical Impression  Pt admitted with above diagnosis. Pt was able to progress ambulation but needed mod assist for safety to ambulate needuing incr assist and cues as he fatigues. Very poor safety awareness. Agree with return to SNF.  Will follow acutely.  Pt currently with functional limitations due to the deficits listed below (see PT Problem List). Pt will benefit from skilled PT to increase their independence and safety with mobility to allow discharge to the venue listed below.          Recommendations for follow up therapy are one component of a multi-disciplinary discharge planning process, led by the attending physician.  Recommendations may be updated based on patient status, additional functional criteria and insurance authorization.  Follow Up Recommendations Skilled nursing-short term rehab (<3 hours/day) Can patient physically be transported by private vehicle: No    Assistance Recommended at Discharge Frequent or constant Supervision/Assistance  Patient can return home with the following  A lot of help with walking and/or transfers;A lot of help with bathing/dressing/bathroom;Assistance with cooking/housework;Assist for transportation;Help with stairs or ramp for entrance    Equipment Recommendations None recommended by PT  Recommendations for Other Services       Functional Status Assessment Patient has had a recent decline in their functional status and demonstrates the ability to make significant improvements in function in a reasonable and predictable amount  of time.     Precautions / Restrictions Precautions Precautions: Fall Restrictions Weight Bearing Restrictions: No      Mobility  Bed Mobility Overal bed mobility: Needs Assistance Bed Mobility: Supine to Sit     Supine to sit: Mod assist     General bed mobility comments: assist to initiate movement and for trunk support to come to EOB    Transfers Overall transfer level: Needs assistance Equipment used: Rolling walker (2 wheels) Transfers: Sit to/from Stand Sit to Stand: Min assist, Mod assist           General transfer comment: Pt needed mod assist to stand and min to stedy once standing. Noted pt with BM on pad therefore decided to walk to bathroom.    Ambulation/Gait Ambulation/Gait assistance: Mod assist, Min assist Gait Distance (Feet): 30 Feet (15 feet x 2) Assistive device: Rolling walker (2 wheels) Gait Pattern/deviations: Step-to pattern, Decreased step length - right, Decreased step length - left, Decreased stride length, Knee flexed in stance - right, Knee flexed in stance - left, Knees buckling, Leaning posteriorly, Staggering left, Staggering right, Trunk flexed   Gait velocity interpretation: <1.31 ft/sec, indicative of household ambulator   General Gait Details: Pt with flexed posture throughout however as pt fatigued his posture worsened needing mod assist with heavy assist to lean pt forward and encouraged progression of gait.  Pt flexes at hips, knees and trunk as he fatigues and needed a lot of guidance to make it to the chair. Pt can stand more upright however needs constant verbal and tactile cues and cannot maintain upright even with max cues and assist.  Pt with poor safety awareness as well.  Stairs  Wheelchair Mobility    Modified Rankin (Stroke Patients Only)       Balance Overall balance assessment: Needs assistance Sitting-balance support: No upper extremity supported, Feet supported Sitting balance-Leahy Scale:  Fair   Postural control: Posterior lean Standing balance support: Bilateral upper extremity supported, During functional activity Standing balance-Leahy Scale: Poor Standing balance comment: heavy posterior lean in standing with UE support and need for external support.                             Pertinent Vitals/Pain Pain Assessment Pain Assessment: No/denies pain    Home Living Family/patient expects to be discharged to:: Skilled nursing facility                   Additional Comments: Recent admit to SNF after last hospital stay    Prior Function Prior Level of Function : Needs assist             Mobility Comments: Needed assist with ambulation with RW ADLs Comments: Assist with ADLS     Hand Dominance   Dominant Hand: Right    Extremity/Trunk Assessment   Upper Extremity Assessment Upper Extremity Assessment: Defer to OT evaluation    Lower Extremity Assessment Lower Extremity Assessment: RLE deficits/detail;LLE deficits/detail RLE Deficits / Details: grossly 3-/5 LLE Deficits / Details: grossly 3-/5    Cervical / Trunk Assessment Cervical / Trunk Assessment: Kyphotic  Communication   Communication: Expressive difficulties  Cognition Arousal/Alertness: Awake/alert Behavior During Therapy: Flat affect Overall Cognitive Status: History of cognitive impairments - at baseline                                          General Comments General comments (skin integrity, edema, etc.): VSS    Exercises     Assessment/Plan    PT Assessment Patient needs continued PT services  PT Problem List Decreased activity tolerance;Decreased balance;Decreased mobility;Decreased knowledge of use of DME;Decreased safety awareness;Decreased knowledge of precautions       PT Treatment Interventions DME instruction;Gait training;Functional mobility training;Therapeutic activities;Therapeutic exercise;Balance training;Patient/family  education    PT Goals (Current goals can be found in the Care Plan section)  Acute Rehab PT Goals Patient Stated Goal: to go back to facility PT Goal Formulation: With patient Time For Goal Achievement: 02/04/23 Potential to Achieve Goals: Good    Frequency Min 2X/week     Co-evaluation               AM-PAC PT "6 Clicks" Mobility  Outcome Measure Help needed turning from your back to your side while in a flat bed without using bedrails?: A Little Help needed moving from lying on your back to sitting on the side of a flat bed without using bedrails?: A Lot Help needed moving to and from a bed to a chair (including a wheelchair)?: A Lot Help needed standing up from a chair using your arms (e.g., wheelchair or bedside chair)?: A Lot Help needed to walk in hospital room?: A Lot Help needed climbing 3-5 steps with a railing? : Total 6 Click Score: 12    End of Session Equipment Utilized During Treatment: Gait belt Activity Tolerance: Patient limited by fatigue Patient left: in chair;with call bell/phone within reach;with chair alarm set Nurse Communication: Mobility status;Need for lift equipment [name]) PT Visit Diagnosis: Muscle weakness (generalized) (M62.81)  Time: 1206-1238 PT Time Calculation (min) (ACUTE ONLY): 32 min   Charges:   PT Evaluation $PT Eval Moderate Complexity: 1 Mod PT Treatments $Gait Training: 8-22 mins        [facility] M,PT Acute Rehab Services [place] 01/21/2023, 2:21 PM

## 2023-01-22 DIAGNOSIS — E440 Moderate protein-calorie malnutrition: Secondary | ICD-10-CM | POA: Insufficient documentation

## 2023-01-22 DIAGNOSIS — R310 Gross hematuria: Secondary | ICD-10-CM | POA: Diagnosis not present

## 2023-01-22 LAB — BASIC METABOLIC PANEL
Anion gap: 5 (ref 5–15)
BUN: 30 mg/dL — ABNORMAL HIGH (ref 8–23)
CO2: 19 mmol/L — ABNORMAL LOW (ref 22–32)
Calcium: 7.7 mg/dL — ABNORMAL LOW (ref 8.9–10.3)
Chloride: 119 mmol/L — ABNORMAL HIGH (ref 98–111)
Creatinine, Ser: 1.31 mg/dL — ABNORMAL HIGH (ref 0.61–1.24)
GFR, Estimated: 52 mL/min — ABNORMAL LOW (ref >60)
Glucose, Bld: 97 mg/dL (ref 70–99)
Potassium: 3.5 mmol/L (ref 3.5–5.1)
Sodium: 143 mmol/L (ref 135–145)

## 2023-01-22 LAB — CBC
HCT: 26.4 % — ABNORMAL LOW (ref 39.0–52.0)
Hemoglobin: 8.4 g/dL — ABNORMAL LOW (ref 13.0–17.0)
MCH: 31.1 pg (ref 26.0–34.0)
MCHC: 31.8 g/dL (ref 30.0–36.0)
MCV: 97.8 fL (ref 80.0–100.0)
Platelets: 340 10*3/uL (ref 150–400)
RBC: 2.7 MIL/uL — ABNORMAL LOW (ref 4.22–5.81)
RDW: 14.1 % (ref 11.5–15.5)
WBC: 11.6 10*3/uL — ABNORMAL HIGH (ref 4.0–10.5)
nRBC: 0 % (ref 0.0–0.2)

## 2023-01-22 MED ORDER — CEPHALEXIN 500 MG PO CAPS
500.0000 mg | ORAL_CAPSULE | Freq: Two times a day (BID) | ORAL | Status: DC
  Administered 2023-01-23 – 2023-01-25 (×5): 500 mg via ORAL
  Filled 2023-01-22 (×5): qty 1

## 2023-01-22 MED ORDER — CEPHALEXIN 500 MG PO CAPS: 500.0000 mg | ORAL_CAPSULE | Freq: Two times a day (BID) | ORAL | Status: DC

## 2023-01-22 MED ORDER — ENSURE ENLIVE PO LIQD
237.0000 mL | Freq: Two times a day (BID) | ORAL | Status: DC
  Administered 2023-01-22 – 2023-01-25 (×7): 237 mL via ORAL

## 2023-01-22 NOTE — Progress Notes (Signed)
Initial Nutrition Assessment  DOCUMENTATION CODES:   Non-severe (moderate) malnutrition in context of chronic illness  INTERVENTION:  - Add Ensure Enlive po BID, each supplement provides 350 kcal and 20 grams of protein.  NUTRITION DIAGNOSIS:   Moderate Malnutrition related to chronic illness as evidenced by moderate muscle depletion, mild fat depletion, percent weight loss.  GOAL:   Patient will meet greater than or equal to 90% of their needs  MONITOR:   PO intake, Supplement acceptance  REASON FOR ASSESSMENT:   Malnutrition Screening Tool    ASSESSMENT:   89 y.o. male admits related to hematuria. PMH includes: dementia, recurrent UTIs, remote prostate cancer s/p radical prostatectomy.  Meds reviewed: lipitor. Labs reviewed: BUN/Creatinine elevated.   The pt reports that his appetite has been "fair" since admission. He reports that he was eating well PTA. Per record, pt has experienced an 11% wt loss per record. Pt states that he would be agreeable to trying a protein shake. RD will add Ensure BID for now and continue to monitor PO intakes.   NUTRITION - FOCUSED PHYSICAL EXAM:  Flowsheet Row Most Recent Value  Orbital Region Mild depletion  Upper Arm Region Mild depletion  Thoracic and Lumbar Region Unable to assess  Buccal Region Mild depletion  Temple Region Moderate depletion  Clavicle Bone Region Moderate depletion  Clavicle and Acromion Bone Region Moderate depletion  Scapular Bone Region Unable to assess  Dorsal Hand Mild depletion  Patellar Region Mild depletion  Anterior Thigh Region Mild depletion  Posterior Calf Region Mild depletion  Edema (RD Assessment) None  Hair Reviewed  Eyes Reviewed  Mouth Reviewed  Skin Reviewed  Nails Reviewed       Diet Order:   Diet Order             Diet regular Fluid consistency: Thin  Diet effective now                   EDUCATION NEEDS:   Not appropriate for education at this time  Skin:  Skin  Assessment: Skin Integrity Issues: Skin Integrity Issues:: Unstageable Unstageable: right heel  Last BM:  3/14  Height:   Ht Readings from Last 1 Encounters:  11/26/22 5\' 5"  (1.651 m)    Weight:   Wt Readings from Last 1 Encounters:  01/21/23 52.9 kg    Ideal Body Weight:     BMI:  Body mass index is 19.41 kg/m.  Estimated Nutritional Needs:   Kcal:  1585-1850 kcals  Protein:  80-90 gm  Fluid:  >/= 1.5 L  Thalia Bloodgood, RD, LDN, CNSC.

## 2023-01-22 NOTE — NC FL2 (Signed)
[place] LEVEL OF CARE FORM     IDENTIFICATION  Patient Name: Trevor Phillips Birthdate: 1931/10/31 Sex: male Admission Date (Current Location): 01/20/2023  [facility] [place] Number:  Herbalist and Address:  [facility], [place] [address] [place], [place] [address]      Provider Number: [id number]  Attending Physician Name and Address:  Cherene Altes, MD  Relative Name and Phone Number:  OSA, BARTNIK) [phone] (    Current Level of Care: Hospital Recommended Level of Care: [place] Prior Approval Number:    Date Approved/Denied:   PASRR Number: [id number]  Discharge Plan: SNF    Current Diagnoses: Patient Active Problem List   Diagnosis Date Noted   Hypokalemia [id number]   Metabolic acidosis A999333   SIRS (systemic inflammatory response syndrome) ([place]) 01/20/2023   Pyuria 01/20/2023   Chronic indwelling Foley catheter 01/20/2023   Primary hypertension 01/20/2023   History of cerebrovascular accident (CVA) due to ischemia 01/20/2023   Dementia without behavioral disturbance ([place]) 01/20/2023   CVA (cerebral vascular accident) ([place]) 11/24/2022   AKI (acute kidney injury) ([place]) 11/24/2022   Elevated BP without diagnosis of hypertension 11/24/2022   Acute urinary retention 11/24/2022   Anemia due to acute blood loss 07/11/2016   Urinary retention with incomplete bladder emptying    Diverticular disease    Hematuria    Cancer of prostate ([place])     Orientation RESPIRATION BLADDER Height & Weight     Self  Normal Indwelling catheter Weight: 116 lb 10 oz (52.9 kg) Height:     BEHAVIORAL SYMPTOMS/MOOD NEUROLOGICAL BOWEL NUTRITION STATUS      Continent Diet (see d/c medication list)  AMBULATORY STATUS COMMUNICATION OF NEEDS Skin   Total Care Verbally PU Stage and Appropriate Care (unstageable Left Heel)                       Personal Care Assistance Level of Assistance   Bathing, Feeding, Dressing Bathing Assistance: Maximum assistance Feeding assistance: Independent Dressing Assistance: Maximum assistance     Functional Limitations Info  Sight, Hearing, Speech Sight Info: Impaired Hearing Info: Impaired Speech Info: Adequate    SPECIAL CARE FACTORS FREQUENCY  OT (By licensed OT), PT (By licensed PT)     PT Frequency: 5x/ week OT Frequency: 5x/ week            Contractures Contractures Info: Not present    Additional Factors Info  Code Status, Allergies, Psychotropic Code Status Info: Full Allergies Info: No Known Allergies Psychotropic Info: seroquel 25mg          Current Medications (01/22/2023):  This is the current hospital active medication list Current Facility-Administered Medications  Medication Dose Route Frequency Provider Last Rate Last Admin   0.9 %  sodium chloride infusion   Intravenous Continuous Cherene Altes, MD 50 mL/hr at 01/21/23 1601 New Bag at 01/21/23 1601   acetaminophen (TYLENOL) tablet 650 mg  650 mg Oral Q6H PRN Nicoletta Dress, Na, MD   650 mg at 01/21/23 2136   atorvastatin (LIPITOR) tablet 40 mg  40 mg Oral Daily Li, Na, MD   40 mg at 01/22/23 1003   bisacodyl (DULCOLAX) suppository 10 mg  10 mg Rectal Daily PRN Cherene Altes, MD       [START ON 01/23/2023] cephALEXin (KEFLEX) capsule 500 mg  500 mg Oral Q12H Cherene Altes, MD       Chlorhexidine Gluconate Cloth  2 % PADS 6 each  6 each Topical Q0600 Cherene Altes, MD   6 each at 01/22/23 0620   donepezil (ARICEPT) tablet 10 mg  10 mg Oral QHS Li, Na, MD   10 mg at 01/21/23 2136   ezetimibe (ZETIA) tablet 10 mg  10 mg Oral QPM Nicoletta Dress, Na, MD   10 mg at 01/21/23 1705   Gerhardt's butt cream   Topical BID Charlann Lange, MD   Given at 01/22/23 1003   leptospermum manuka honey (MEDIHONEY) paste 1 Application  1 Application Topical Daily Nicoletta Dress, Na, MD   1 Application at [id number]   magnesium hydroxide (MILK OF MAGNESIA) suspension 15 mL  15 mL Oral Daily PRN Cherene Altes, MD       melatonin tablet 5 mg  5 mg Oral QHS Cherene Altes, MD   5 mg at 01/21/23 2136   methenamine (MANDELAMINE) tablet 500 mg  500 mg Oral BID WC Cherene Altes, MD   500 mg at 01/22/23 0742   ondansetron (ZOFRAN) tablet 4 mg  4 mg Oral Q6H PRN Charlann Lange, MD       Or   ondansetron (ZOFRAN) injection 4 mg  4 mg Intravenous Q6H PRN Nicoletta Dress, Na, MD       QUEtiapine (SEROQUEL) tablet 25 mg  25 mg Oral QHS Nicoletta Dress, Na, MD   25 mg at 01/21/23 2136   sodium phosphate (FLEET) 7-19 GM/118ML enema   Rectal Daily PRN Cherene Altes, MD         Discharge Medications: Please see discharge summary for a list of discharge medications.  Relevant Imaging Results:  Relevant Lab Results:   Additional Information SS#: [id number]  Paulene Floor Agustus Mane, LCSWA

## 2023-01-22 NOTE — Discharge Summary (Deleted)
DISCHARGE SUMMARY  Trevor Phillips  MR#: [id number]  DOB:Jan 31, 1931  Date of Admission: 01/20/2023 Date of Discharge: 01/22/2023  Attending Physician:Akeela Busk Hennie Duos, MD  Patient's NT:[id number], [name], MD  Consults: Urology  Disposition: D/C to his prior SNF   Follow-up Appts:  Contact information for follow-up providers     [place], [name], MD Follow up in 1 week(s).   Specialty: Internal Medicine Contact information: [address] [address] [phone]         Ardis Hughs, MD Follow up.   Specialty: Urology Why: The patient will need to be seen in the office in the next 10-14 days for a change of his foley catheter and f/u of his hematuria. Contact information: [address] [place] [address] [phone]              Contact information for after-discharge care     [facility] .   Service: Skilled Nursing Contact information: 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 In the ER urinalysis revealed pyuria.   Hospital Course:  Hematuria in setting of chronic Foley catheter Likely related to UTI - no major clotting/obstruction encountered - case reviewed with Urology with no need for acute intervention presently - to follow-up with Dr. 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 Follow-up as noted above - will need his Foley catheter changed every 3-4 weeks, to be coordinated through the Urology office   Acute blood loss anemia Baseline hemoglobin approximately 14 -hemoglobin appears to be stabilizing at present -hold ASA and Plavix for 5 additional days    Acute kidney injury Appeared to be simple prerenal azotemia - improving with hydration -assure patient has consistent oral intake at facility - recheck BMET in ~3-5 days    Hypokalemia Corrected with supplementation   HTN No changes made in chronic blood pressure regimen   CVA January 2024 Continue statin for now - ASA and Plavix on hold for an additional 7 days given hematuria   Dementia Continue usual Aricept and Seroquel - no signif agitation during this admission    Rounded 1cm nodular opacity in the lingula  Given advanced age and cognitive deficit further evaluation is not clinically indicated  R heel pressure  ulcer POA Noted by WOC - unstageable - cleanse right heel with NS and apply medihoney > over with gauze and silicone heel dressing - peel back foam and change daily - change heel foam every three days   Moderate Malnutrition related to chronic illness as evidenced by moderate muscle depletion, mild fat depletion, percent weight loss   Allergies as of 01/22/2023   No Known Allergies      Medication List     STOP taking  these medications    aspirin 81 MG chewable tablet   clopidogrel 75 MG tablet Commonly known as: PLAVIX   QUEtiapine 25 MG tablet Commonly known as: SEROQUEL       TAKE these medications    acetaminophen 325 MG tablet Commonly known as: TYLENOL Take 2 tablets (650 mg total) by mouth every 4 (four) hours as needed for mild pain (or temp > 37.5 C (99.5 F)). What changed: reasons to take this   atorvastatin 40 MG tablet Commonly known as: LIPITOR Take 1 tablet (40 mg total) by mouth daily. What changed: when to take this   bisacodyl 10 MG suppository Commonly known as: DULCOLAX Place 10 mg rectally daily as needed (constipation not relieved by [place]).   cephALEXin 500 MG capsule Commonly known as: KEFLEX Take 1 capsule (500 mg total) by mouth every 12 (twelve) hours for 4 days. Start taking on: January 23, 2023   donepezil 10 MG tablet Commonly known as: ARICEPT Take 10 mg by mouth at bedtime.   ezetimibe 10 MG tablet Commonly known as: ZETIA Take 10 mg by mouth every evening.   ferrous sulfate 325 (65 FE) MG EC tablet Take 325 mg by mouth daily.   melatonin 5 MG Tabs Take 5 mg by mouth at bedtime.   methenamine 1 g tablet Commonly known as: HIPREX Take 0.5 g by mouth 2 (two) times daily.   MILK OF MAGNESIA PO Take 30 mLs by mouth daily as needed (constipation).   Nutritional Drink Liqd Take 120 mLs by mouth 2 (two) times daily. Med Pass   RA SALINE ENEMA RE Place 1 enema rectally daily as needed (constipation not relieved by bisacodyl suppository.).   Vitamin D3 50 MCG (2000 UT) Caps Generic drug: Cholecalciferol Take 2,000 Units by mouth daily.        Day of Discharge BP 131/87 (BP Location: Right Arm)   Pulse 86   Temp 97.7 F (36.5 C) (Oral)   Resp 17   Wt 52.9 kg   SpO2 98%   BMI 19.41 kg/m   Physical Exam: General: No acute respiratory distress Lungs: Clear to auscultation bilaterally without wheezes or  crackles Cardiovascular: Regular rate and rhythm without murmur  Abdomen: Nontender, nondistended, soft, bowel sounds positive, no rebound Extremities: No significant edema bilateral lower extremities  Basic Metabolic Panel: Recent Labs  Lab 01/20/23 0357 01/20/23 1950 01/21/23 0241 01/22/23 0651  NA 142 143 144 143  K 3.3* 3.5 3.4* 3.5  CL 120* 114* 118* 119*  CO2 14* 18* 19* 19*  GLUCOSE 83 87 79 97  BUN 44* 39* 38* 30*  CREATININE 1.44* 1.47* 1.44* 1.31*  CALCIUM 6.5* 8.1* 7.7* 7.7*    CBC: Recent Labs  Lab 01/20/23 0133 01/20/23 0805 01/20/23 1950 01/21/23 0241 01/22/23 0651  WBC 17.1* 16.1*  --  13.1* 11.6*  NEUTROABS 13.4*  --   --   --   --   HGB 8.5* 9.1* 10.2* 8.5* 8.4*  HCT 26.7* 29.4* 31.5*  26.7* 26.4*  MCV 100.0 100.3*  --  98.5 97.8  PLT 360 393  --  333 340    Time spent in discharge (includes decision making & examination of pt): 35 minutes  01/22/2023, 3:32 PM   Cherene Altes, MD [facility] Office  [phone]

## 2023-01-22 NOTE — TOC Progression Note (Addendum)
Transition of Care [facility]) - Initial/Assessment Note    Patient Details  Name: Trevor Phillips MRN: [id number] Date of Birth: Jan 15, 1931  Transition of Care [facility]) CM/SW Contact:    Milinda Antis, LCSWA Phone Number: 01/22/2023, 10:02 AM  Clinical Narrative:                 LCSW contacted [facility] (SNF).  The facility can accept once insurance authorization is received.  MD updated.  12:10- Insurance authorization is still pending.  13:54- Insurance Josem Kaufmann is still pending.  16:25- Insurance auth still pending.  LCSW contacted admissions at [facility] to inquire about the patient returning over the weekend if insurance authorization is approved and is awaiting a response.  TOC following.     Patient Goals and CMS Choice            Expected Discharge Plan and Services                                              Prior Living Arrangements/Services                       Activities of Daily Living      Permission Sought/Granted                  Emotional Assessment              Admission diagnosis:  Hypokalemia [E87.6] Anemia due to acute blood loss [D62] Renal insufficiency [N28.9] Gross hematuria [R31.0] Hematuria [R31.9] Patient Active Problem List   Diagnosis Date Noted   Hypokalemia A999333   Metabolic acidosis A999333   SIRS (systemic inflammatory response syndrome) ([place]) 01/20/2023   Pyuria 01/20/2023   Chronic indwelling Foley catheter 01/20/2023   Primary hypertension 01/20/2023   History of cerebrovascular accident (CVA) due to ischemia 01/20/2023   Dementia without behavioral disturbance ([place]) 01/20/2023   CVA (cerebral vascular accident) ([place]) 11/24/2022   AKI (acute kidney injury) ([place]) 11/24/2022   Elevated BP without diagnosis of hypertension 11/24/2022   Acute urinary retention 11/24/2022   Anemia due to acute blood loss 07/11/2016   Urinary retention with incomplete bladder emptying    Diverticular disease     Hematuria    Cancer of prostate ([place])    PCP:  Lajean Manes, MD Pharmacy:   [facility] [phone] [address], [address] AT [facility] [address] [place] [address] Phone: [phone] Fax: [phone]     Social Determinants of Health (SDOH) Social History: SDOH Screenings   Food Insecurity: No Food Insecurity (11/26/2022)  Housing: Low Risk  (11/26/2022)  Transportation Needs: No Transportation Needs (11/26/2022)  Utilities: Not At Risk (11/26/2022)  Tobacco Use: Medium Risk (01/20/2023)   SDOH Interventions:     Readmission Risk Interventions     No data to display

## 2023-01-23 DIAGNOSIS — R310 Gross hematuria: Secondary | ICD-10-CM | POA: Diagnosis not present

## 2023-01-23 MED ORDER — CLOPIDOGREL BISULFATE 75 MG PO TABS: 75.0000 mg | ORAL_TABLET | Freq: Every day | ORAL | Status: DC

## 2023-01-23 MED ORDER — ASPIRIN 81 MG PO CHEW: 81.0000 mg | CHEWABLE_TABLET | Freq: Every day | ORAL | Status: DC

## 2023-01-23 NOTE — Progress Notes (Signed)
Trevor Phillips  [id number] DOB: 18-Sep-1931 DOA: 01/20/2023 PCP: Lajean Manes, MD    Brief Narrative:  89 year old SNF resident with a history of dementia, chronic Foley catheter due to retention with recurrent UTIs, prostate cancer status post radical prostatectomy, and diverticulosis who was sent to the ER from his SNF when he was found to have gross hematuria.  In the ER urinalysis revealed pyuria.  Consultants:  Urology   Goals of Care:  Code Status: Full Code   DVT prophylaxis: SCDs  Interim Hx: The patient was medically stable and cleared for discharge to his SNF yesterday but a complication and his insurance authorization prevented him from being able to leave.  He remains medically stable.  He can be discharged as soon as his insurance authorization is completed.  Assessment & Plan:  Hematuria in setting of chronic Foley catheter Likely related to UTI - no major clotting/obstruction encountered - case reviewed with Urology with no need for acute intervention - to follow-up with Dr. 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 Follow-up as noted above - will need his Foley catheter changed every 3-4 weeks, to be coordinated through the Urology office   Acute blood loss anemia Baseline hemoglobin approximately 14 -hemoglobin appears to be stabilizing at present - hold ASA and Plavix for 5 additional days or until his hematuria has fully resolved     Acute kidney injury Appeared to be simple prerenal azotemia - improved with hydration - assure patient has consistent oral intake at facility - recheck BMET in ~3-5 days    Hypokalemia Corrected with supplementation   HTN No changes made in chronic blood pressure regimen   CVA  January 2024 Continue statin for now - ASA and Plavix on hold for an additional 5 days given hematuria   Dementia Continue usual Aricept and Seroquel - no signif agitation during this admission    Rounded 1cm nodular opacity in the lingula  Given advanced age and cognitive deficit further evaluation is not clinically indicated   R heel pressure ulcer POA Noted by WOC - unstageable - cleanse right heel with NS and apply medihoney, cover with gauze and silicone heel dressing - peel back foam and change daily - change heel foam every three days    Moderate Malnutrition related to chronic illness as evidenced by moderate muscle depletion, mild fat depletion, percent weight loss   Family Communication: No family present at time of visit Disposition: From SNF -remains medically stable for discharge to SNF   Objective: Blood pressure 137/67, pulse 81, temperature 97.9 F (36.6 C), temperature source Oral, resp. rate 18, weight 52.9 kg, SpO2 100 %.  Intake/Output Summary (Last 24 hours) at 01/23/2023 0837 Last data filed at 01/23/2023 0600 Gross per 24 hour  Intake 717 ml  Output 1160 ml  Net -443 ml    Filed Weights   01/21/23 0426  Weight: 52.9 kg    Examination: General: No acute respiratory distress Lungs: Clear to auscultation bilaterally  Cardiovascular: Regular rate and rhythm  Abdomen: Nontender, nondistended, soft, bowel sounds positive Extremities: No signif edema bilateral lower extremities  CBC: Recent Labs  Lab 01/20/23 0133 01/20/23 0805 01/20/23 1950 01/21/23 0241 01/22/23 0651  WBC 17.1* 16.1*  --  13.1* 11.6*  NEUTROABS 13.4*  --   --   --   --   HGB 8.5* 9.1* 10.2* 8.5* 8.4*  HCT 26.7* 29.4* 31.5* 26.7* 26.4*  MCV 100.0 100.3*  --  98.5 97.8  PLT 360 393  --  333 [id number]    Basic Metabolic Panel: Recent Labs  Lab 01/20/23 1950 01/21/23 0241 01/22/23 0651  NA 143 144 143  K 3.5 3.4* 3.5  CL 114* 118* 119*  CO2 18* 19* 19*  GLUCOSE 87 79 97  BUN  39* 38* 30*  CREATININE 1.47* 1.44* 1.31*  CALCIUM 8.1* 7.7* 7.7*    GFR: Estimated Creatinine Clearance: 28.6 mL/min (A) (by C-G formula based on SCr of 1.31 mg/dL (H)).   Scheduled Meds:  atorvastatin  40 mg Oral Daily   cephALEXin  500 mg Oral Q12H   Chlorhexidine Gluconate Cloth  6 each Topical Q0600   donepezil  10 mg Oral QHS   ezetimibe  10 mg Oral QPM   feeding supplement  237 mL Oral BID BM   Gerhardt's butt cream   Topical BID   leptospermum manuka honey  1 Application Topical Daily   melatonin  5 mg Oral QHS   methenamine  500 mg Oral BID WC   QUEtiapine  25 mg Oral QHS     LOS: 3 days   Cherene Altes, MD [facility] Office  [phone] Pager - Text Page per Shea Evans  If 7PM-7AM, please contact night-coverage per Amion 01/23/2023, 8:37 AM

## 2023-01-24 NOTE — Progress Notes (Signed)
Trevor Phillips  [id number] DOB: [birth year] DOA: 01/20/2023 PCP: Lajean Manes, MD    Brief Narrative:  89 year old SNF resident with a history of dementia, chronic Foley catheter due to retention with recurrent UTIs, prostate cancer status post radical prostatectomy, and diverticulosis who was sent to the ER from his SNF when he was found to have gross hematuria.  In the ER urinalysis revealed pyuria.  Consultants:  Urology   Goals of Care:  Code Status: Full Code   DVT prophylaxis: SCDs  Interim Hx: The patient remains medically stable and cleared for discharge to his SNF, but a complication with his insurance authorization is preventing him from being able to leave. He can be discharged as soon as his insurance authorization is completed.  Assessment & Plan:  Hematuria in setting of chronic Foley catheter Likely related to UTI - no major clotting/obstruction encountered - case reviewed with Urology with no need for acute intervention - to follow-up with Dr. 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 Follow-up as noted above - will need his Foley catheter changed every 3-4 weeks, to be coordinated through the Urology office   Acute blood loss anemia Baseline hemoglobin approximately 14 - hemoglobin appears to be stabilizing at present - hold ASA and Plavix for 5 additional days (until 3/21) or until his hematuria has fully resolved     Acute kidney injury Appeared to be simple prerenal azotemia - improved with hydration - assure patient has consistent oral intake at facility - recheck BMET in ~3-5 days    Hypokalemia Corrected with supplementation   HTN No changes made in chronic blood pressure regimen   CVA January 2024 Continue  statin for now - ASA and Plavix on hold for an additional 5 days (until 3/21) given hematuria   Dementia Continue usual Aricept and Seroquel - no signif agitation during this admission    Rounded 1cm nodular opacity in the lingula  Given advanced age and cognitive deficit further evaluation is not clinically indicated   R heel pressure ulcer POA Noted by WOC - unstageable - cleanse right heel with NS and apply medihoney, cover with gauze and silicone heel dressing - peel back foam and change daily - change heel foam every three days    Moderate Malnutrition related to chronic illness as evidenced by moderate muscle depletion, mild fat depletion, percent weight loss   Family Communication: No family present at time of visit Disposition: From SNF -remains medically stable for discharge to SNF   Objective: Blood pressure (!) 145/81, pulse 80, temperature 97.6 F (36.4 C), temperature source Oral, resp. rate 18, weight 52.9 kg, SpO2 95 %.  Intake/Output Summary (Last 24 hours) at 01/24/2023 0824 Last data filed at 01/24/2023 0536 Gross per 24 hour  Intake 580 ml  Output 1945 ml  Net -1365 ml    Filed Weights   01/21/23 0426  Weight: 52.9 kg    Examination: General: No acute respiratory distress  CBC: Recent Labs  Lab 01/20/23 0133 01/20/23 0805 01/20/23 1950 01/21/23 0241 01/22/23 0651  WBC 17.1* 16.1*  --  13.1* 11.6*  NEUTROABS 13.4*  --   --   --   --   HGB 8.5* 9.1* 10.2* 8.5* 8.4*  HCT 26.7* 29.4* 31.5* 26.7* 26.4*  MCV 100.0 100.3*  --  98.5 97.8  PLT 360 393  --  333 [id number]    Basic Metabolic Panel: Recent Labs  Lab 01/20/23 1950 01/21/23 0241 01/22/23 0651  NA 143 144 143  K 3.5 3.4* 3.5  CL 114* 118* 119*  CO2 18* 19* 19*  GLUCOSE 87 79 97  BUN 39* 38* 30*  CREATININE 1.47* 1.44* 1.31*  CALCIUM 8.1* 7.7* 7.7*    GFR: Estimated Creatinine Clearance: 28.6 mL/min (A) (by C-G formula based on SCr of 1.31 mg/dL (H)).   Scheduled Meds:  [START ON  01/28/2023] aspirin  81 mg Oral Daily   atorvastatin  40 mg Oral Daily   cephALEXin  500 mg Oral Q12H   Chlorhexidine Gluconate Cloth  6 each Topical Q0600   [START ON 01/28/2023] clopidogrel  75 mg Oral Daily   donepezil  10 mg Oral QHS   ezetimibe  10 mg Oral QPM   feeding supplement  237 mL Oral BID BM   Gerhardt's butt cream   Topical BID   leptospermum manuka honey  1 Application Topical Daily   melatonin  5 mg Oral QHS   methenamine  500 mg Oral BID WC   QUEtiapine  25 mg Oral QHS     LOS: 4 days   Cherene Altes, MD [facility] Office  [phone] Pager - Text Page per Shea Evans  If 7PM-7AM, please contact night-coverage per Amion 01/24/2023, 8:24 AM

## 2023-01-25 DIAGNOSIS — R310 Gross hematuria: Secondary | ICD-10-CM | POA: Diagnosis not present

## 2023-01-25 LAB — CULTURE, BLOOD (ROUTINE X 2)
Culture: NO GROWTH
Culture: NO GROWTH

## 2023-01-25 MED ORDER — CEPHALEXIN 500 MG PO CAPS: 500.0000 mg | ORAL_CAPSULE | Freq: Two times a day (BID) | ORAL | 0 refills | Status: AC

## 2023-01-25 NOTE — Progress Notes (Signed)
PT AVS reviewed with RN at [facility] and there were no questions regarding DC instructions. Pt has all pt belongings and will be going with him to [facility] via [facility].

## 2023-01-25 NOTE — Progress Notes (Signed)
Physical Therapy Treatment Patient Details Name: Trevor Phillips MRN: [id number] DOB: 1931-04-04 Today's Date: 01/25/2023   History of Present Illness 89 year old male was transferred from a skilled nursing facility to the emergency department 3/13 for concern of worsening hematuria.  The patient has a long history of urologic issues and is status post prostatectomy with subsequent bladder neck contraction. PMH: CVA    PT Comments    Pt progressing towards physical therapy goals.  Was able to perform transfers and minimal ambulation with min-mod assist and RW for support. Session limited due to need for BM. Pt successful on BSC and required min assist for static standing balance while therapist performed hygiene. Pt complaining of pain in sacrum and noted blanachable redness over sacral area. PT placed sacral foam dressing prophylactically and RN was notified. Will continue to follow, and progress as able per POC.    Recommendations for follow up therapy are one component of a multi-disciplinary discharge planning process, led by the attending physician.  Recommendations may be updated based on patient status, additional functional criteria and insurance authorization.  Follow Up Recommendations  Skilled nursing-short term rehab (<3 hours/day) Can patient physically be transported by private vehicle: No   Assistance Recommended at Discharge Frequent or constant Supervision/Assistance  Patient can return home with the following A lot of help with walking and/or transfers;A lot of help with bathing/dressing/bathroom;Assistance with cooking/housework;Assist for transportation;Help with stairs or ramp for entrance   Equipment Recommendations  None recommended by PT    Recommendations for Other Services       Precautions / Restrictions Precautions Precautions: Fall Restrictions Weight Bearing Restrictions: No     Mobility  Bed Mobility Overal bed mobility: Needs Assistance Bed  Mobility: Supine to Sit     Supine to sit: Min guard     General bed mobility comments: Increased time but pt was able to transition to EOB without physical assist. HOB elevated and pt required use of UE's for balance support.    Transfers Overall transfer level: Needs assistance Equipment used: Rolling walker (2 wheels) Transfers: Sit to/from Stand, Bed to chair/wheelchair/BSC Sit to Stand: Mod assist   Step pivot transfers: Min assist       General transfer comment: Assist for power up to full stand and increased time required to gain/maintain standing balance. Pt required assist for walker management and balance as he took pivotal steps around to the [facility].    Ambulation/Gait Ambulation/Gait assistance: Min assist Gait Distance (Feet): 5 Feet Assistive device: Rolling walker (2 wheels) Gait Pattern/deviations: Decreased stride length, Shuffle, Trunk flexed, Narrow base of support Gait velocity: Decreased Gait velocity interpretation: <1.31 ft/sec, indicative of household ambulator   General Gait Details: Pt able to ambulate ~5 feet from [facility] to the recliner in corner of the room. Assist for walker management and balance.   Stairs             Wheelchair Mobility    Modified Rankin (Stroke Patients Only)       Balance Overall balance assessment: Needs assistance Sitting-balance support: No upper extremity supported, Feet supported Sitting balance-Leahy Scale: Fair   Postural control: Posterior lean Standing balance support: Bilateral upper extremity supported, During functional activity, Reliant on assistive device for balance Standing balance-Leahy Scale: Poor                              Cognition Arousal/Alertness: Awake/alert Behavior During Therapy: WFL for tasks assessed/performed Overall  Cognitive Status: History of cognitive impairments - at baseline                                          Exercises      General  Comments        Pertinent Vitals/Pain Pain Assessment Pain Assessment: Faces Faces Pain Scale: Hurts a little bit Pain Location: LE's Pain Descriptors / Indicators: Aching (Stiff) Pain Intervention(s): Limited activity within patient's tolerance, Monitored during session, Repositioned    Home Living                          Prior Function            PT Goals (current goals can now be found in the care plan section) Acute Rehab PT Goals Patient Stated Goal: to go back to facility PT Goal Formulation: With patient Time For Goal Achievement: 02/04/23 Potential to Achieve Goals: Good Progress towards PT goals: Progressing toward goals    Frequency    Min 2X/week      PT Plan Current plan remains appropriate    Co-evaluation              AM-PAC PT "6 Clicks" Mobility   Outcome Measure  Help needed turning from your back to your side while in a flat bed without using bedrails?: A Little Help needed moving from lying on your back to sitting on the side of a flat bed without using bedrails?: A Little Help needed moving to and from a bed to a chair (including a wheelchair)?: A Little Help needed standing up from a chair using your arms (e.g., wheelchair or bedside chair)?: A Lot Help needed to walk in hospital room?: Total Help needed climbing 3-5 steps with a railing? : Total 6 Click Score: 13    End of Session Equipment Utilized During Treatment: Gait belt Activity Tolerance: Patient limited by fatigue Patient left: in chair;with call bell/phone within reach;with chair alarm set Nurse Communication: Mobility status;Need for lift equipment Trevor Phillips) PT Visit Diagnosis: Muscle weakness (generalized) (M62.81)     Time: 0930-1007 PT Time Calculation (min) (ACUTE ONLY): 37 min  Charges:  $Gait Training: 23-37 mins                     Trevor Phillips, PT, DPT Acute Rehabilitation Services Secure Chat Preferred Office: [phone]    Trevor Phillips 01/25/2023, 10:41 AM

## 2023-01-25 NOTE — TOC Transition Note (Signed)
Transition of Care [facility]) - CM/SW Discharge Note   Patient Details  Name: Trevor Phillips MRN: [id number] Date of Birth: [birth year]  [facility]) CM/SW Contact:  Milinda Antis, Annetta North Phone Number: 01/25/2023, 3:34 PM   Clinical Narrative:    Patient will DC to: [facility] Anticipated DC date: 01/25/2023 Family notified: Yes, Son Transport by: Corey Harold   Per MD patient ready for DC to SNF. RN to call report prior to discharge [phone] room 314A). RN, patient's family, and facility notified of DC. Discharge Summary sent to facility. DC packet on chart. Ambulance will be transport requested for patient.   CSW will sign off for now as social work intervention is no longer needed. Please consult [place] again if new needs arise.    Final next level of care: [place] Barriers to Discharge: No Barriers Identified   Patient Goals and CMS Choice      Discharge Placement                Patient chooses bed at: [facility] Patient to be transferred to facility by: [place] Name of family member notified: ZAYVON, YELLE (Son) [phone] Patient and family notified of of transfer: 01/25/23  Discharge Plan and Services Additional resources added to the After Visit Summary for                                       Social Determinants of Health (SDOH) Interventions SDOH Screenings   Food Insecurity: No Food Insecurity (11/26/2022)  Housing: Low Risk  (11/26/2022)  Transportation Needs: No Transportation Needs (11/26/2022)  Utilities: Not At Risk (11/26/2022)  Tobacco Use: Medium Risk (01/20/2023)     Readmission Risk Interventions     No data to display

## 2023-01-25 NOTE — Discharge Summary (Signed)
DISCHARGE SUMMARY  Trevor Phillips  MR#: [id number]  DOB:Mar 25, 1931  Date of Admission: 01/20/2023 Date of Discharge: 01/25/2023  Attending Physician:Jemarcus Dougal Hennie Duos, MD  Patient's [id number], [name], MD  Consults: Urology  Disposition: D/C to his prior SNF   Follow-up Appts:  Contact information for follow-up providers     Stoneking, [name], MD Follow up in 1 week(s).   Specialty: Internal Medicine Contact information: [address] [place] [address] [phone]         Ardis Hughs, MD Follow up.   Specialty: Urology Why: The patient will need to be seen in the office in the next 10-14 days for a change of his foley catheter and f/u of his hematuria. Contact information: [address] [place] [address] [phone]              Contact information for after-discharge care     [facility] .   Service: Skilled Nursing Contact information: [id number] 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 In the ER urinalysis revealed pyuria.   Hospital Course:  Hematuria in setting of chronic Foley catheter Likely related to UTI - no major clotting/obstruction encountered - case reviewed with Urology with no need for acute intervention - to follow-up with Dr. 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 Follow-up as noted above - will need his Foley catheter changed every 3-4 weeks, to be coordinated through the Urology office   Acute blood loss anemia Baseline hemoglobin approximately 14 - hemoglobin stabilized during this admission - hold ASA and Plavix until 3/21 or until his hematuria has fully resolved     Acute kidney injury Appeared to be simple prerenal azotemia - improved with hydration - assure patient has consistent oral intake at facility - recheck BMET in ~3-5 days    Hypokalemia Corrected with supplementation   HTN No changes made in chronic blood pressure regimen   CVA January 2024 Continue statin for now - ASA and Plavix on hold for an additional 5 days (until 3/21) given hematuria   Dementia Continue usual Aricept and Seroquel - no signif agitation during this admission    Rounded 1cm nodular opacity in the lingula  Given advanced age and cognitive  deficit further evaluation is not clinically indicated   R heel pressure ulcer POA Noted by WOC - unstageable - cleanse right heel with NS and apply medihoney, cover with gauze and silicone heel dressing - peel back foam and change daily - change heel foam every three days    Moderate Malnutrition related to chronic illness as evidenced by moderate muscle depletion, mild fat depletion, percent weight loss     Allergies as of 01/25/2023   No  Known Allergies      Medication List     STOP taking these medications    aspirin 81 MG chewable tablet   clopidogrel 75 MG tablet Commonly known as: PLAVIX   QUEtiapine 25 MG tablet Commonly known as: SEROQUEL       TAKE these medications    acetaminophen 325 MG tablet Commonly known as: TYLENOL Take 2 tablets (650 mg total) by mouth every 4 (four) hours as needed for mild pain (or temp > 37.5 C (99.5 F)). What changed: reasons to take this   atorvastatin 40 MG tablet Commonly known as: LIPITOR Take 1 tablet (40 mg total) by mouth daily. What changed: when to take this   bisacodyl 10 MG suppository Commonly known as: DULCOLAX Place 10 mg rectally daily as needed (constipation not relieved by [place]).   cephALEXin 500 MG capsule Commonly known as: KEFLEX Take 1 capsule (500 mg total) by mouth every 12 (twelve) hours for 2 days.   donepezil 10 MG tablet Commonly known as: ARICEPT Take 10 mg by mouth at bedtime.   ezetimibe 10 MG tablet Commonly known as: ZETIA Take 10 mg by mouth every evening.   ferrous sulfate 325 (65 FE) MG EC tablet Take 325 mg by mouth daily.   melatonin 5 MG Tabs Take 5 mg by mouth at bedtime.   methenamine 1 g tablet Commonly known as: HIPREX Take 0.5 g by mouth 2 (two) times daily.   MILK OF MAGNESIA PO Take 30 mLs by mouth daily as needed (constipation).   Nutritional Drink Liqd Take 120 mLs by mouth 2 (two) times daily. Med Pass   RA SALINE ENEMA RE Place 1 enema rectally daily as needed (constipation not relieved by bisacodyl suppository.).   Vitamin D3 50 MCG (2000 UT) Caps Generic drug: Cholecalciferol Take 2,000 Units by mouth daily.       Day of Discharge BP (!) 147/76 (BP Location: Right Arm)   Pulse 88   Temp 97.8 F (36.6 C)   Resp 19   Wt 52.9 kg   SpO2 96%   BMI 19.41 kg/m   Physical Exam: General: No acute respiratory distress Lungs: Clear to auscultation bilaterally without  wheeze Cardiovascular: Regular rate and rhythm without murmur  Abdomen: Nontender, nondistended, soft, bowel sounds positive, no rebound Extremities: No significant edema bilateral lower extremities  Basic Metabolic Panel: Recent Labs  Lab 01/20/23 0357 01/20/23 1950 01/21/23 0241 01/22/23 0651  NA 142 143 144 143  K 3.3* 3.5 3.4* 3.5  CL 120* 114* 118* 119*  CO2 14* 18* 19* 19*  GLUCOSE 83 87 79 97  BUN 44* 39* 38* 30*  CREATININE 1.44* 1.47* 1.44* 1.31*  CALCIUM 6.5* 8.1* 7.7* 7.7*    CBC: Recent Labs  Lab 01/20/23 0133 01/20/23 0805 01/20/23 1950 01/21/23 0241 01/22/23 0651  WBC 17.1* 16.1*  --  13.1* 11.6*  NEUTROABS 13.4*  --   --   --   --   HGB 8.5* 9.1* 10.2* 8.5* 8.4*  HCT 26.7* 29.4* 31.5* 26.7* 26.4*  MCV 100.0 100.3*  --  98.5 97.8  PLT 360 393  --  333 340    Time spent in discharge (includes decision making & examination of pt): 35 minutes  01/25/2023, 1:26 PM   Cherene Altes, MD [facility] Office  [phone]

## 2023-01-25 NOTE — Progress Notes (Signed)
DISCHARGE SUMMARY  Trevor Phillips  MR#: [id number]  DOB:1931-08-14  Date of Admission: 01/20/2023 Date of Discharge: 01/25/2023  Attending Physician:Aliciana Ricciardi Hennie Duos, MD  Patient's [id number], Trevor Ha, MD  Consults: Urology  Disposition: D/C to his prior SNF   Follow-up Appts:  Contact information for follow-up providers     Stoneking, Trevor Ha, MD Follow up in 1 week(s).   Specialty: Internal Medicine Contact information: [address] [place] [address] [phone]         Ardis Hughs, MD Follow up.   Specialty: Urology Why: The patient will need to be seen in the office in the next 10-14 days for a change of his foley catheter and f/u of his hematuria. Contact information: [address] [place] [address] [phone]              Contact information for after-discharge care     [facility] .   Service: Skilled Nursing Contact information: [id number] 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 In the ER urinalysis revealed pyuria.   Hospital Course:  Hematuria in setting of chronic Foley catheter Likely related to UTI - no major clotting/obstruction encountered - case reviewed with Urology with no need for acute intervention presently - to follow-up with Dr. 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 Follow-up as noted above - will need his Foley catheter changed every 3-4 weeks, to be coordinated through the Urology office   Acute blood loss anemia Baseline hemoglobin approximately 14 -hemoglobin appears to be stabilizing at present -hold ASA and Plavix for 5 additional days    Acute kidney injury Appeared to be simple prerenal azotemia - improving with hydration -assure patient has consistent oral intake at facility - recheck BMET in ~3-5 days    Hypokalemia Corrected with supplementation   HTN No changes made in chronic blood pressure regimen   CVA January 2024 Continue statin for now - ASA and Plavix on hold for an additional 5 days given hematuria   Dementia Continue usual Aricept and Seroquel - no signif agitation during this admission    Rounded 1cm nodular opacity in the lingula  Given advanced age and cognitive deficit further evaluation is not clinically indicated  R heel pressure  ulcer POA Noted by WOC - unstageable - cleanse right heel with NS and apply medihoney > over with gauze and silicone heel dressing - peel back foam and change daily - change heel foam every three days   Moderate Malnutrition related to chronic illness as evidenced by moderate muscle depletion, mild fat depletion, percent weight loss   Allergies as of 01/25/2023   No Known Allergies      Medication List     STOP taking  these medications    aspirin 81 MG chewable tablet   clopidogrel 75 MG tablet Commonly known as: PLAVIX   QUEtiapine 25 MG tablet Commonly known as: SEROQUEL       TAKE these medications    acetaminophen 325 MG tablet Commonly known as: TYLENOL Take 2 tablets (650 mg total) by mouth every 4 (four) hours as needed for mild pain (or temp > 37.5 C (99.5 F)). What changed: reasons to take this   atorvastatin 40 MG tablet Commonly known as: LIPITOR Take 1 tablet (40 mg total) by mouth daily. What changed: when to take this   bisacodyl 10 MG suppository Commonly known as: DULCOLAX Place 10 mg rectally daily as needed (constipation not relieved by [place]).   cephALEXin 500 MG capsule Commonly known as: KEFLEX Take 1 capsule (500 mg total) by mouth every 12 (twelve) hours for 2 days.   donepezil 10 MG tablet Commonly known as: ARICEPT Take 10 mg by mouth at bedtime.   ezetimibe 10 MG tablet Commonly known as: ZETIA Take 10 mg by mouth every evening.   ferrous sulfate 325 (65 FE) MG EC tablet Take 325 mg by mouth daily.   melatonin 5 MG Tabs Take 5 mg by mouth at bedtime.   methenamine 1 g tablet Commonly known as: HIPREX Take 0.5 g by mouth 2 (two) times daily.   MILK OF MAGNESIA PO Take 30 mLs by mouth daily as needed (constipation).   Nutritional Drink Liqd Take 120 mLs by mouth 2 (two) times daily. Med Pass   RA SALINE ENEMA RE Place 1 enema rectally daily as needed (constipation not relieved by bisacodyl suppository.).   Vitamin D3 50 MCG (2000 UT) Caps Generic drug: Cholecalciferol Take 2,000 Units by mouth daily.        Day of Discharge BP (!) 147/76 (BP Location: Right Arm)   Pulse 88   Temp 97.8 F (36.6 C)   Resp 19   Wt 52.9 kg   SpO2 96%   BMI 19.41 kg/m   Physical Exam: General: No acute respiratory distress Lungs: Clear to auscultation bilaterally without wheezes or crackles Cardiovascular: Regular rate and rhythm  without murmur  Abdomen: Nontender, nondistended, soft, bowel sounds positive, no rebound Extremities: No significant edema bilateral lower extremities  Basic Metabolic Panel: Recent Labs  Lab 01/20/23 0357 01/20/23 1950 01/21/23 0241 01/22/23 0651  NA 142 143 144 143  K 3.3* 3.5 3.4* 3.5  CL 120* 114* 118* 119*  CO2 14* 18* 19* 19*  GLUCOSE 83 87 79 97  BUN 44* 39* 38* 30*  CREATININE 1.44* 1.47* 1.44* 1.31*  CALCIUM 6.5* 8.1* 7.7* 7.7*     CBC: Recent Labs  Lab 01/20/23 0133 01/20/23 0805 01/20/23 1950 01/21/23 0241 01/22/23 0651  WBC 17.1* 16.1*  --  13.1* 11.6*  NEUTROABS 13.4*  --   --   --   --   HGB 8.5* 9.1* 10.2* 8.5* 8.4*  HCT 26.7* 29.4* 31.5* 26.7* 26.4*  MCV 100.0  100.3*  --  98.5 97.8  PLT 360 393  --  333 340     Time spent in discharge (includes decision making & examination of pt): 35 minutes  01/25/2023, 1:29 PM   Cherene Altes, MD [facility] Office  [phone]

## 2023-02-08 IMAGING — DX DG RIBS W/ CHEST 3+V*R*
3 series · 3 of 3 positions shown · non-contrast
Comparison: October 23, 2019

CLINICAL DATA: Lower right-sided rib pain status post fall 1 week
ago.

EXAM:
RIGHT RIBS AND CHEST - 3+ VIEW

[dg ribs unilateral w/chest right (1 of 3)]
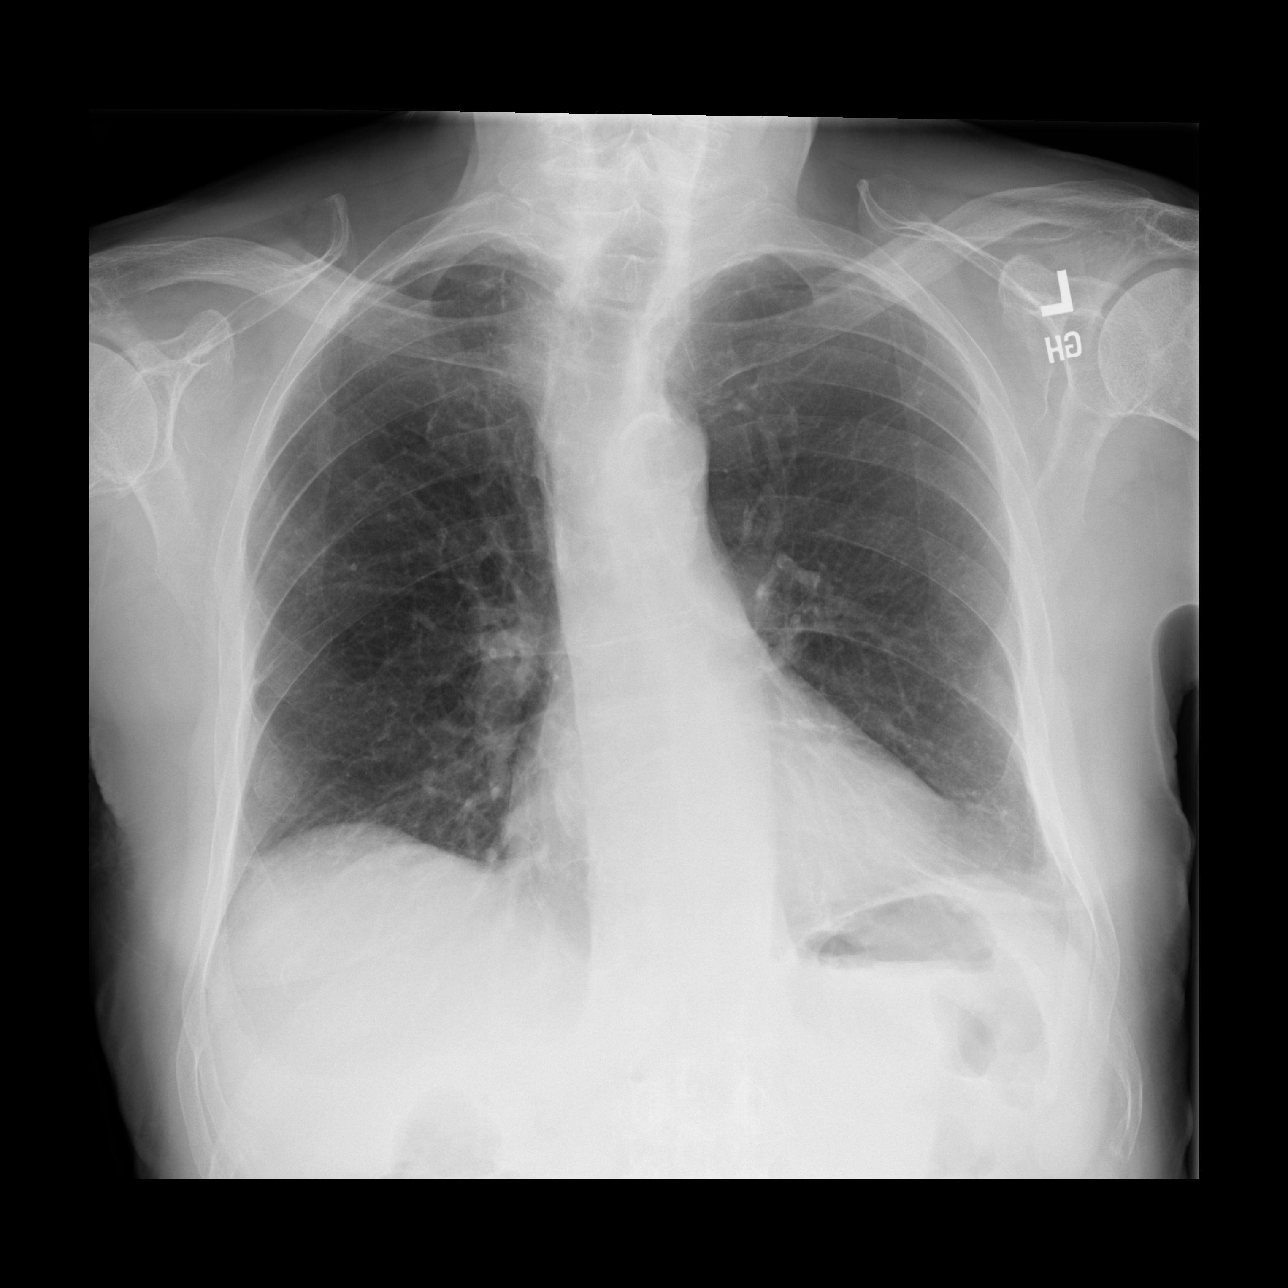

[dg ribs unilateral w/chest right (2 of 3)]
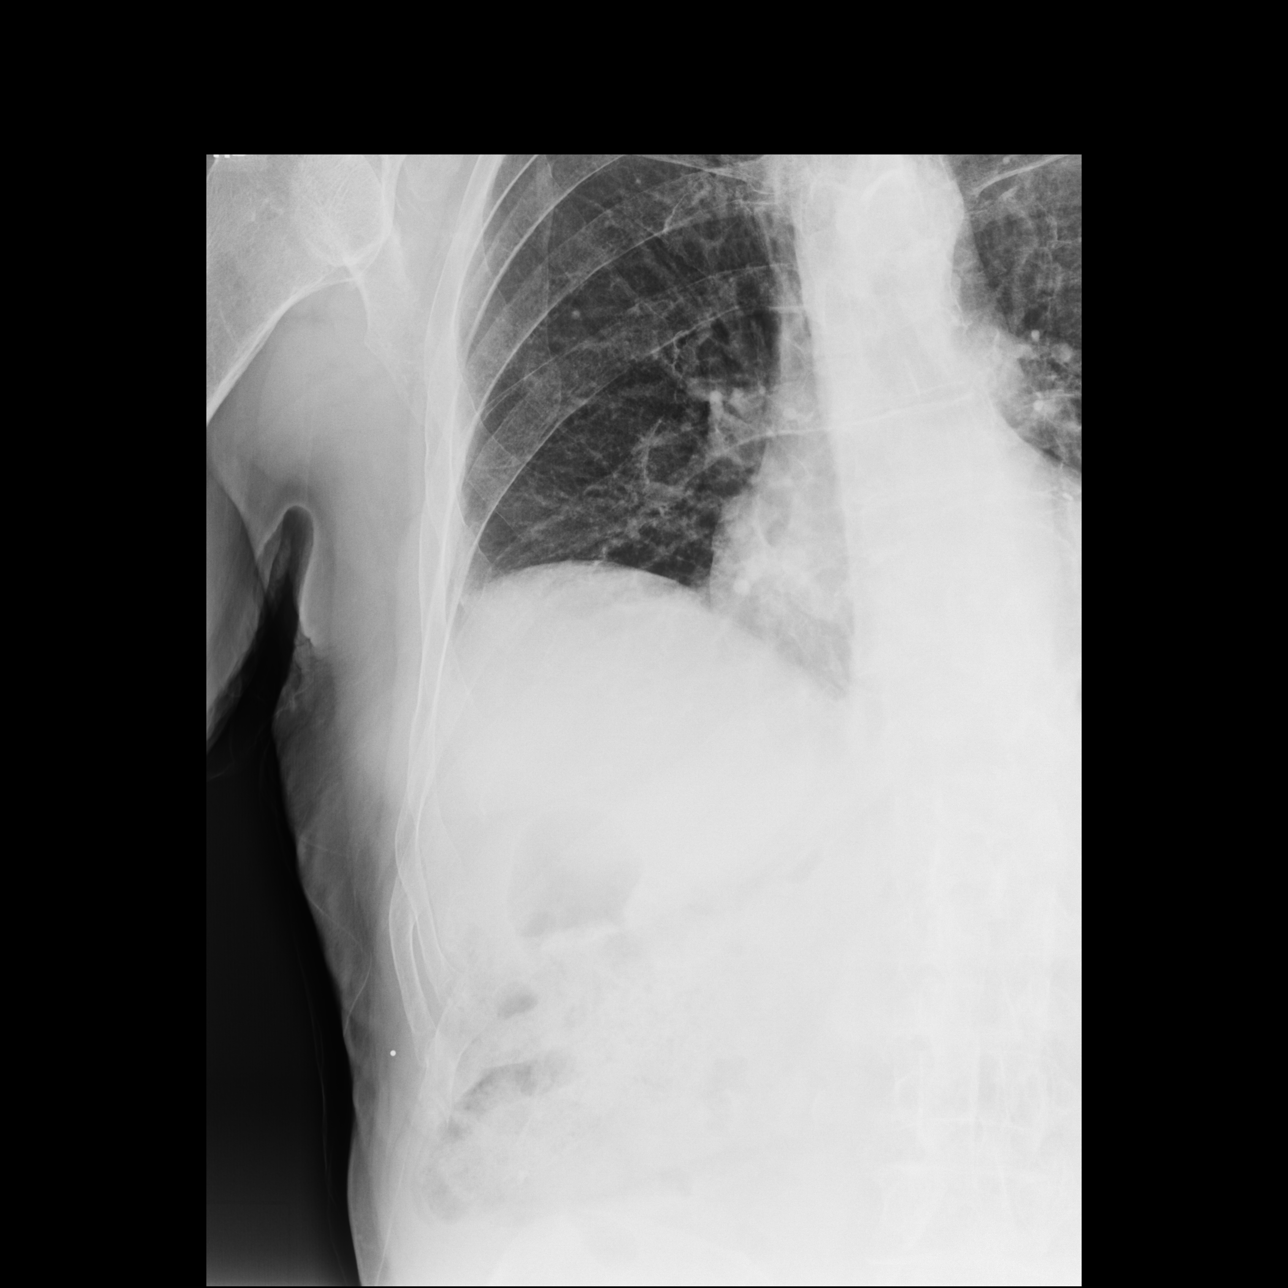

[dg ribs unilateral w/chest right (3 of 3)]
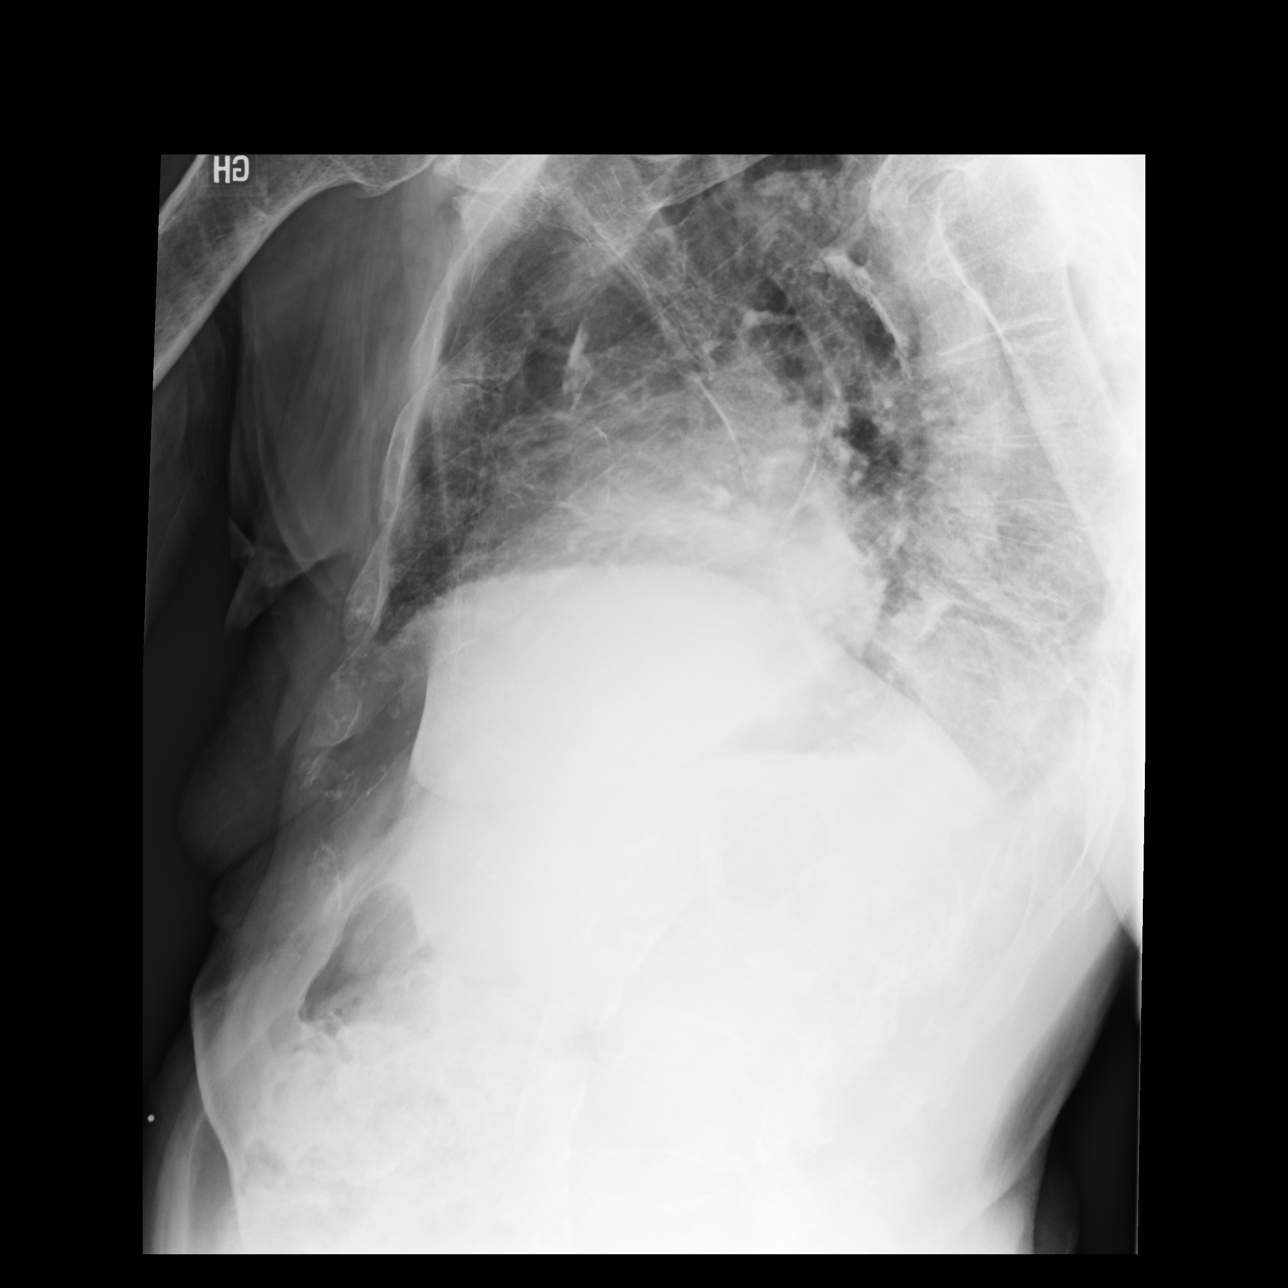

[3 of 3 positions shown; findings below may reference images not displayed]

FINDINGS: A radiopaque marker was placed at the site of the patient's pain. No
fracture or other bone lesions are seen involving the ribs. There is
no evidence of pneumothorax or pleural effusion. Very mild linear
atelectasis is seen within the left lung base. Heart size and
mediastinal contours are within normal limits.
IMPRESSION: No acute osseous abnormality.

## 2023-03-22 ENCOUNTER — Emergency Department (HOSPITAL_COMMUNITY)
Admission: EM | Admit: 2023-03-22 | Discharge: 2023-03-22 | Disposition: A | Discharge status: Skilled Nursing Facility | Payer: Medicare PPO | Attending: Emergency Medicine | Admitting: Emergency Medicine

## 2023-03-22 ENCOUNTER — Other Ambulatory Visit: Payer: Self-pay

## 2023-03-22 DIAGNOSIS — T83011A Breakdown (mechanical) of indwelling urethral catheter, initial encounter: Secondary | ICD-10-CM

## 2023-03-22 DIAGNOSIS — Y838 Other surgical procedures as the cause of abnormal reaction of the patient, or of later complication, without mention of misadventure at the time of the procedure: Secondary | ICD-10-CM | POA: Diagnosis not present

## 2023-03-22 DIAGNOSIS — Z8546 Personal history of malignant neoplasm of prostate: Secondary | ICD-10-CM | POA: Diagnosis not present

## 2023-03-22 DIAGNOSIS — R339 Retention of urine, unspecified: Secondary | ICD-10-CM | POA: Diagnosis present

## 2023-03-22 DIAGNOSIS — R319 Hematuria, unspecified: Secondary | ICD-10-CM | POA: Diagnosis not present

## 2023-03-22 DIAGNOSIS — T83098A Other mechanical complication of other indwelling urethral catheter, initial encounter: Secondary | ICD-10-CM | POA: Diagnosis not present

## 2023-03-22 MED ORDER — HYDROMORPHONE HCL 1 MG/ML IJ SOLN
0.5000 mg | Freq: Once | INTRAMUSCULAR | Status: AC
  Administered 2023-03-22: 0.5 mg via INTRAVENOUS
  Filled 2023-03-22: qty 1

## 2023-03-22 NOTE — ED Notes (Signed)
Report called to [place] at [facility] living facility.

## 2023-03-22 NOTE — Consult Note (Signed)
Urology Consult  Referring physician: Dr. Posey Rea Reason for referral: difficult foley  Chief Complaint: difficult foley  History of Present Illness:  Trevor Phillips. Trevor Phillips is an 89 year old male presenting from his skilled nursing facility due to traumatic Foley insertion with inability to replace Foley catheter.  Patient is known to our practice and has been followed by Dr. Mena Goes.  PMH significant for radical retropubic prostatectomy with repair of rectal perforation, bladder neck contracture, and hematuria.  On my arrival patient was alert, only oriented to self, and in no distress.  He reported minimal discomfort but was tachypneic.  Per report his Foley catheter was removed at his facility last night.  It appears another catheter was placed incorrectly and balloon inflated in the prostate where it was left until the following day.  No urine output was noted so Foley catheter was removed and patient was delivered to the emergency department.  Nursing and the physician both attempted coud placement unsuccessfully, then consulted [facility].  Past Medical History:  Diagnosis Date   Anemia due to acute blood loss    gross hematuria   Bladder neck contracture    chronic   Diverticulosis of colon    Foley catheter in place    placed 07-11-2016   Gross hematuria    History of cerebral infarction    per CT 05-18-2012  chronic lacunar infarcts in basal ganglia  (silent?,  pt denies history S&S)   History of colon polyps    2004   History of diverticulitis    2014   History of prostate cancer 2003   s/p  radical prostatectomy 03-21-2002--  no recurrence (last PSA 07/2016  0.01)   History of recurrent UTIs    History of urinary retention    Neurogenic bladder    since prostatectomy 2003   Self-catheterizes urinary bladder    SECONDARY TO NEUROGENIC BLADDER    Urinary retention    Wears glasses    Past Surgical History:  Procedure Laterality Date   CATARACT EXTRACTION W/ INTRAOCULAR  LENS  IMPLANT, BILATERAL  2010   CYSTO/  DILATATION BLADDER NECK CONTRACTURE  06/02/2002   CYSTOSCOPY W/ RETROGRADES  07/21/2016   Procedure: CYSTOSCOPY WITH RETROGRADE PYELOGRAM, CLOT EVACUATION, BLADDER BIOPSY WITH FULGERATION;  Surgeon: Jerilee Field, MD;  Location: [facility];  Service: Urology;;  0.5 TO 2 CM    EYE SURGERY Right yrs ago   PROSTATE SURGERY  yrs ago   RADICAL RETROPUBIC PROSTATECTOMY /  REPAIR RECTAL PERFORATION  03-21-2002  dr Windy Fast davis   TRANSURETHRAL RESECTION OF PROSTATE  09/06/2001     Allergies: No Known Allergies  No family history on file. Social History:  reports that he quit smoking about 65 years ago. His smoking use included cigarettes. He has a 8.00 pack-year smoking history. He quit smokeless tobacco use about 43 years ago.  His smokeless tobacco use included snuff. He reports current alcohol use. He reports that he does not use drugs.  ROS: confused, UTA  Physical Exam:  Vital signs in last 24 hours: Pulse Rate:  [77-90] 90 (05/13 1515) Resp:  [16-19] 18 (05/13 1515) BP: (171-200)/(78-155) 177/78 (05/13 1515) SpO2:  [100 %] 100 % (05/13 1515) Weight:  [52 kg] 52 kg (05/13 1240)  Cardiovascular: Skin warm; not flushed Respiratory: Breaths quiet; tachypnic Abdomen: No masses, soft, no guarding or rebound Neurological: Normal sensation to touch Musculoskeletal: Normal motor function arms and legs Skin: No rashes Genitourinary:69f foley in place  Laboratory Data:  No results found for this or any previous visit (from the past 72 hour(s)). No results found for this or any previous visit (from the past 240 hour(s)). Creatinine: No results for input(s): "CREATININE" in the last 168 hours.  Imaging: See report/chart   Assessment/Plan:  #Complication of Foley catheter #Hematuria #Acute urinary retention  Patient was in acute urinary retention with bleeding from the urethra. 59f coud placed, please see separate procedure  note Catheter in place.  Schedule next exchange within 30 days to be done over wire in the office.  [name] [name] 03/22/2023, 3:39 PM  Pager: [phone]

## 2023-03-22 NOTE — ED Notes (Signed)
Patient resting at this time. No Distress. Awaiting PTAR

## 2023-03-22 NOTE — Procedures (Signed)
Trevor Phillips. [name] is an 89 year old male being seen for difficult catheter placement.  Please see separate consult note.  On arrival patient was alert but confused.  He was amenable to me attempting to replace his catheter and said that he was in some, but not extreme, discomfort.  Patient was prepped and draped in the usual sterile fashion.  Considering the multiple previous attempts I started with a sensor wire which was advanced into the bladder without resistance.  At this point I attempted to pass a 63f council tip catheter over the wire without success.  Patient was in a significant amount of discomfort and bearing down as hard as he could.  It was difficult to get him to relax.  I then attempted a 109f council tip catheter with the same effect.  We took a break and started an IV so that 0.5mg  of Dilaudid could be given.  After time for pain medication to take effect was provided we proceeded with a I attempted to advance the 48f council again without success. At his point a 63f coud catheter was fashioned into a council tip.  This was advanced over wire into the bladder with return of cloudy dark yellow urine.  Wire was offloaded and patient put out about 700cc of dark pungent urine.  There was a small amount of hematuria at the end, likely coming from the prostate.  While he may have some bladder neck contracture, I feel that most of the resistance was due to a significant amount of trauma.  I would suggest that he have his next catheter exchange done in our clinic over wire. Feel free to call with questions.   Roderic Scarce, NP [facility] Pager: [phone]

## 2023-03-22 NOTE — ED Notes (Signed)
Attempted to insert 67F coude catheter. Patient had bleeding from penis prior to my insertion attempt. Unable to pass, met great resistance. Once catheter removed clot and blood noted in tube. MD notified

## 2023-03-22 NOTE — ED Provider Notes (Signed)
[place] EMERGENCY DEPARTMENT AT [facility] Provider Note   CSN: [id number] Arrival date & time: 03/22/23  1214     History  No chief complaint on file.   SAMAJ DOEHRING is a 89 y.o. male.  Patient is an 89 year old male with a history of chronic urinary retention with a chronic Foley catheter, prior cerebral infarct with history of dementia who is presenting today from his living facility due to inability to get a new catheter placed.  The facility reports that yesterday his coud catheter was removed and a regular Foley was placed but it was not draining.  They attempted to place a coud this morning and were only getting blood out.  No other symptoms at this time.  Patient denies any pain and no other complaints at this time.  He does not take any anticoagulation.  The history is provided by the patient and the EMS personnel.       Home Medications Prior to Admission medications   Medication Sig Start Date End Date Taking? Authorizing Provider  acetaminophen (TYLENOL) 325 MG tablet Take 2 tablets (650 mg total) by mouth every 4 (four) hours as needed for mild pain (or temp > 37.5 C (99.5 F)). Patient taking differently: Take 650 mg by mouth every 4 (four) hours as needed (pain, temperature > 99.42F). 12/01/22   Lonia Blood, MD  atorvastatin (LIPITOR) 40 MG tablet Take 1 tablet (40 mg total) by mouth daily. Patient taking differently: Take 40 mg by mouth at bedtime. 12/02/22   Lonia Blood, MD  bisacodyl (DULCOLAX) 10 MG suppository Place 10 mg rectally daily as needed (constipation not relieved by Milk of Magnesia).    [provider]  Cholecalciferol (VITAMIN D3) 50 MCG (2000 UT) CAPS Take 2,000 Units by mouth daily.    [provider]  donepezil (ARICEPT) 10 MG tablet Take 10 mg by mouth at bedtime.    [provider]  ezetimibe (ZETIA) 10 MG tablet Take 10 mg by mouth every evening.     [provider]  ferrous sulfate  325 (65 FE) MG EC tablet Take 325 mg by mouth daily.    [provider]  Magnesium Hydroxide (MILK OF MAGNESIA PO) Take 30 mLs by mouth daily as needed (constipation).    [provider]  melatonin 5 MG TABS Take 5 mg by mouth at bedtime.    [provider]  methenamine (HIPREX) 1 G tablet Take 0.5 g by mouth 2 (two) times daily.    [provider]  Nutritional Supplements (NUTRITIONAL DRINK) LIQD Take 120 mLs by mouth 2 (two) times daily. Med Pass    [provider]  Sodium Phosphates (RA SALINE ENEMA RE) Place 1 enema rectally daily as needed (constipation not relieved by bisacodyl suppository.).    [provider]      Allergies    Patient has no known allergies.    Review of Systems   Review of Systems  Physical Exam Updated Vital Signs Ht 5\' 5"  (1.651 m)   Wt 52 kg   BMI 19.08 kg/m  Physical Exam Vitals and nursing note reviewed.  Cardiovascular:     Rate and Rhythm: Normal rate.  Pulmonary:     Effort: Pulmonary effort is normal.  Abdominal:     General: Abdomen is flat. There is no distension.     Palpations: Abdomen is soft.     Tenderness: There is no abdominal tenderness.  Skin:  General: Skin is warm.  Neurological:     Mental Status: He is alert. Mental status is at baseline.     ED Results / Procedures / Treatments   Labs (all labs ordered are listed, but only abnormal results are displayed) Labs Reviewed - No data to display  EKG None  Radiology No results found.  Procedures Procedures    Medications Ordered in ED Medications - No data to display  ED Course/ Medical Decision Making/ A&P                             Medical Decision Making  Patient presenting today for a new catheter placed.  He has a chronic Foley catheter at this time due to neurogenic bladder, chronic urinary retention.  Facility attempted to place a new catheter yesterday it was not draining they attempted to place a  coud after that and reports they only got blood.  At this time patient does not appear uncomfortable or have significant bladder distention.  No report of any infectious symptoms or changes in behavior.  He does not take any anticoagulation.  Will attempt to place new Foley catheter.  1:55 PM 2 attempts at trying to place coude and only blood return and resistance.  Will consult urology for catheter placement.         Final Clinical Impression(s) / ED Diagnoses Final diagnoses:  None    Rx / DC Orders ED Discharge Orders     None         Gwyneth Sprout, MD 03/22/23 1356

## 2023-03-22 NOTE — ED Triage Notes (Signed)
Pt to the ed from nursing facility [facility] with a CC needing a catheter. Pt had a coude cath placed in the week prior and it was removed yesterday. Nursing facility unable to place new catheter.  Pt denies pain at this time. Pt demented at baseline

## 2023-03-22 NOTE — ED Notes (Signed)
Ptar called, 8th in line

## 2023-03-23 NOTE — ED Provider Notes (Signed)
  Physical Exam  BP (!) 153/66   Pulse 81   Temp 98.3 F (36.8 C) (Oral)   Resp 20   Ht 5\' 5"  (1.651 m)   Wt 52 kg   SpO2 97%   BMI 19.08 kg/m   Physical Exam Constitutional:      General: He is not in acute distress.    Appearance: Normal appearance.  HENT:     Head: Normocephalic and atraumatic.     Nose: No congestion or rhinorrhea.  Eyes:     General:        Right eye: No discharge.        Left eye: No discharge.     Extraocular Movements: Extraocular movements intact.     Pupils: Pupils are equal, round, and reactive to light.  Cardiovascular:     Rate and Rhythm: Normal rate and regular rhythm.     Heart sounds: No murmur heard. Pulmonary:     Effort: No respiratory distress.     Breath sounds: No wheezing or rales.  Abdominal:     General: There is no distension.     Tenderness: There is no abdominal tenderness.  Musculoskeletal:        General: Normal range of motion.     Cervical back: Normal range of motion.  Skin:    General: Skin is warm and dry.  Neurological:     General: No focal deficit present.     Mental Status: He is alert.     Procedures  Procedures  ED Course / MDM    Medical Decision Making Risk Prescription drug management.   Patient received in handoff.  Patient reportedly removed his Foley catheter traumatically and urology is at bedside replacing Foley due to suspected possible urethral stricture.  Urology placed a small lumen Foley catheter and is requesting observation for at least another hour to ensure that the patient's hematuria does not clog the small lumen catheter.  Patient was observed and Foley was flushed without difficulty.  He will follow-up outpatient with urology for likely exchange over a wire in the outpatient setting.  Patient then discharged       Glendora Score, MD 03/23/23 1440

## 2023-03-26 ENCOUNTER — Emergency Department (HOSPITAL_COMMUNITY): Payer: Medicare PPO

## 2023-03-26 ENCOUNTER — Inpatient Hospital Stay (HOSPITAL_COMMUNITY): Payer: Medicare PPO

## 2023-03-26 ENCOUNTER — Inpatient Hospital Stay (HOSPITAL_COMMUNITY)
Admission: EM | Admit: 2023-03-26 | Discharge: 2023-04-10 | DRG: 698 | Disposition: E | Payer: Medicare PPO | Source: Skilled Nursing Facility | Attending: Family Medicine | Admitting: Family Medicine

## 2023-03-26 ENCOUNTER — Other Ambulatory Visit: Payer: Self-pay

## 2023-03-26 ENCOUNTER — Encounter (HOSPITAL_COMMUNITY): Payer: Self-pay

## 2023-03-26 DIAGNOSIS — E872 Acidosis, unspecified: Secondary | ICD-10-CM | POA: Diagnosis present

## 2023-03-26 DIAGNOSIS — N319 Neuromuscular dysfunction of bladder, unspecified: Secondary | ICD-10-CM | POA: Diagnosis present

## 2023-03-26 DIAGNOSIS — E876 Hypokalemia: Secondary | ICD-10-CM | POA: Diagnosis present

## 2023-03-26 DIAGNOSIS — J9601 Acute respiratory failure with hypoxia: Secondary | ICD-10-CM | POA: Diagnosis not present

## 2023-03-26 DIAGNOSIS — R4020 Unspecified coma: Secondary | ICD-10-CM | POA: Diagnosis not present

## 2023-03-26 DIAGNOSIS — R6521 Severe sepsis with septic shock: Secondary | ICD-10-CM | POA: Diagnosis not present

## 2023-03-26 DIAGNOSIS — E440 Moderate protein-calorie malnutrition: Secondary | ICD-10-CM | POA: Diagnosis present

## 2023-03-26 DIAGNOSIS — A419 Sepsis, unspecified organism: Secondary | ICD-10-CM | POA: Diagnosis present

## 2023-03-26 DIAGNOSIS — N390 Urinary tract infection, site not specified: Secondary | ICD-10-CM

## 2023-03-26 DIAGNOSIS — Z681 Body mass index (BMI) 19 or less, adult: Secondary | ICD-10-CM | POA: Diagnosis not present

## 2023-03-26 DIAGNOSIS — I5A Non-ischemic myocardial injury (non-traumatic): Secondary | ICD-10-CM | POA: Diagnosis present

## 2023-03-26 DIAGNOSIS — N136 Pyonephrosis: Secondary | ICD-10-CM | POA: Diagnosis present

## 2023-03-26 DIAGNOSIS — I214 Non-ST elevation (NSTEMI) myocardial infarction: Secondary | ICD-10-CM

## 2023-03-26 DIAGNOSIS — D649 Anemia, unspecified: Secondary | ICD-10-CM | POA: Diagnosis present

## 2023-03-26 DIAGNOSIS — F0390 Unspecified dementia without behavioral disturbance: Secondary | ICD-10-CM | POA: Diagnosis present

## 2023-03-26 DIAGNOSIS — I2489 Other forms of acute ischemic heart disease: Secondary | ICD-10-CM | POA: Insufficient documentation

## 2023-03-26 DIAGNOSIS — Y846 Urinary catheterization as the cause of abnormal reaction of the patient, or of later complication, without mention of misadventure at the time of the procedure: Secondary | ICD-10-CM | POA: Diagnosis present

## 2023-03-26 DIAGNOSIS — I10 Essential (primary) hypertension: Secondary | ICD-10-CM | POA: Diagnosis present

## 2023-03-26 DIAGNOSIS — G9340 Encephalopathy, unspecified: Secondary | ICD-10-CM | POA: Diagnosis not present

## 2023-03-26 DIAGNOSIS — Z1152 Encounter for screening for COVID-19: Secondary | ICD-10-CM | POA: Diagnosis not present

## 2023-03-26 DIAGNOSIS — Z515 Encounter for palliative care: Secondary | ICD-10-CM | POA: Diagnosis not present

## 2023-03-26 DIAGNOSIS — Z66 Do not resuscitate: Secondary | ICD-10-CM | POA: Diagnosis present

## 2023-03-26 DIAGNOSIS — G9341 Metabolic encephalopathy: Secondary | ICD-10-CM | POA: Diagnosis present

## 2023-03-26 DIAGNOSIS — T83511A Infection and inflammatory reaction due to indwelling urethral catheter, initial encounter: Secondary | ICD-10-CM | POA: Diagnosis present

## 2023-03-26 DIAGNOSIS — Z978 Presence of other specified devices: Secondary | ICD-10-CM

## 2023-03-26 DIAGNOSIS — Z8673 Personal history of transient ischemic attack (TIA), and cerebral infarction without residual deficits: Secondary | ICD-10-CM

## 2023-03-26 DIAGNOSIS — Z8546 Personal history of malignant neoplasm of prostate: Secondary | ICD-10-CM | POA: Diagnosis not present

## 2023-03-26 DIAGNOSIS — Z9079 Acquired absence of other genital organ(s): Secondary | ICD-10-CM

## 2023-03-26 DIAGNOSIS — Z87891 Personal history of nicotine dependence: Secondary | ICD-10-CM

## 2023-03-26 DIAGNOSIS — Z79899 Other long term (current) drug therapy: Secondary | ICD-10-CM

## 2023-03-26 DIAGNOSIS — R4182 Altered mental status, unspecified: Secondary | ICD-10-CM | POA: Diagnosis present

## 2023-03-26 DIAGNOSIS — Z5309 Procedure and treatment not carried out because of other contraindication: Secondary | ICD-10-CM | POA: Diagnosis not present

## 2023-03-26 DIAGNOSIS — N179 Acute kidney failure, unspecified: Secondary | ICD-10-CM | POA: Diagnosis present

## 2023-03-26 DIAGNOSIS — N201 Calculus of ureter: Secondary | ICD-10-CM

## 2023-03-26 LAB — COMPREHENSIVE METABOLIC PANEL
ALT: 48 U/L — ABNORMAL HIGH (ref 0–44)
AST: 71 U/L — ABNORMAL HIGH (ref 15–41)
Albumin: 2.5 g/dL — ABNORMAL LOW (ref 3.5–5.0)
Alkaline Phosphatase: 199 U/L — ABNORMAL HIGH (ref 38–126)
Anion gap: 15 (ref 5–15)
BUN: 29 mg/dL — ABNORMAL HIGH (ref 8–23)
CO2: 16 mmol/L — ABNORMAL LOW (ref 22–32)
Calcium: 8.5 mg/dL — ABNORMAL LOW (ref 8.9–10.3)
Chloride: 107 mmol/L (ref 98–111)
Creatinine, Ser: 1.9 mg/dL — ABNORMAL HIGH (ref 0.61–1.24)
GFR, Estimated: 33 mL/min — ABNORMAL LOW (ref >60)
Glucose, Bld: 157 mg/dL — ABNORMAL HIGH (ref 70–99)
Potassium: 3.2 mmol/L — ABNORMAL LOW (ref 3.5–5.1)
Sodium: 138 mmol/L (ref 135–145)
Total Bilirubin: 1.3 mg/dL — ABNORMAL HIGH (ref 0.3–1.2)
Total Protein: 6 g/dL — ABNORMAL LOW (ref 6.5–8.1)

## 2023-03-26 LAB — CBC WITH DIFFERENTIAL/PLATELET
Abs Immature Granulocytes: 0.35 10*3/uL — ABNORMAL HIGH (ref 0.00–0.07)
Basophils Absolute: 0.1 10*3/uL (ref 0.0–0.1)
Basophils Relative: 0 %
Eosinophils Absolute: 0 10*3/uL (ref 0.0–0.5)
Eosinophils Relative: 0 %
HCT: 32.5 % — ABNORMAL LOW (ref 39.0–52.0)
Hemoglobin: 10.3 g/dL — ABNORMAL LOW (ref 13.0–17.0)
Immature Granulocytes: 2 %
Lymphocytes Relative: 2 %
Lymphs Abs: 0.5 10*3/uL — ABNORMAL LOW (ref 0.7–4.0)
MCH: 30.5 pg (ref 26.0–34.0)
MCHC: 31.7 g/dL (ref 30.0–36.0)
MCV: 96.2 fL (ref 80.0–100.0)
Monocytes Absolute: 0.1 10*3/uL (ref 0.1–1.0)
Monocytes Relative: 0 %
Neutro Abs: 21.9 10*3/uL — ABNORMAL HIGH (ref 1.7–7.7)
Neutrophils Relative %: 96 %
Platelets: 309 10*3/uL (ref 150–400)
RBC: 3.38 MIL/uL — ABNORMAL LOW (ref 4.22–5.81)
RDW: 14.6 % (ref 11.5–15.5)
WBC: 22.9 10*3/uL — ABNORMAL HIGH (ref 4.0–10.5)
nRBC: 0 % (ref 0.0–0.2)

## 2023-03-26 LAB — I-STAT VENOUS BLOOD GAS, ED
Acid-base deficit: 9 mmol/L — ABNORMAL HIGH (ref 0.0–2.0)
Bicarbonate: 15.3 mmol/L — ABNORMAL LOW (ref 20.0–28.0)
Calcium, Ion: 1.17 mmol/L (ref 1.15–1.40)
HCT: 31 % — ABNORMAL LOW (ref 39.0–52.0)
Hemoglobin: 10.5 g/dL — ABNORMAL LOW (ref 13.0–17.0)
O2 Saturation: 43 %
Potassium: 3.3 mmol/L — ABNORMAL LOW (ref 3.5–5.1)
Sodium: 140 mmol/L (ref 135–145)
TCO2: 16 mmol/L — ABNORMAL LOW (ref 22–32)
pCO2, Ven: 28.3 mmHg — ABNORMAL LOW (ref 44–60)
pH, Ven: 7.342 (ref 7.25–7.43)
pO2, Ven: 25 mmHg — CL (ref 32–45)

## 2023-03-26 LAB — TROPONIN I (HIGH SENSITIVITY)
Troponin I (High Sensitivity): 7051 ng/L (ref <18)
Troponin I (High Sensitivity): 8342 ng/L (ref <18)

## 2023-03-26 LAB — URINALYSIS, ROUTINE W REFLEX MICROSCOPIC
Bilirubin Urine: NEGATIVE
Glucose, UA: NEGATIVE mg/dL
Ketones, ur: NEGATIVE mg/dL
Nitrite: NEGATIVE
Protein, ur: 100 mg/dL — AB
Specific Gravity, Urine: 1.017 (ref 1.005–1.030)
WBC, UA: >50 WBC/hpf (ref 0–5)
pH: 5 (ref 5.0–8.0)

## 2023-03-26 LAB — I-STAT CHEM 8, ED
BUN: 27 mg/dL — ABNORMAL HIGH (ref 8–23)
Calcium, Ion: 1.2 mmol/L (ref 1.15–1.40)
Chloride: 105 mmol/L (ref 98–111)
Creatinine, Ser: 1.6 mg/dL — ABNORMAL HIGH (ref 0.61–1.24)
Glucose, Bld: 152 mg/dL — ABNORMAL HIGH (ref 70–99)
HCT: 30 % — ABNORMAL LOW (ref 39.0–52.0)
Hemoglobin: 10.2 g/dL — ABNORMAL LOW (ref 13.0–17.0)
Potassium: 3.3 mmol/L — ABNORMAL LOW (ref 3.5–5.1)
Sodium: 141 mmol/L (ref 135–145)
TCO2: 17 mmol/L — ABNORMAL LOW (ref 22–32)

## 2023-03-26 LAB — SARS CORONAVIRUS 2 BY RT PCR: SARS Coronavirus 2 by RT PCR: NEGATIVE

## 2023-03-26 LAB — BRAIN NATRIURETIC PEPTIDE: B Natriuretic Peptide: 755.6 pg/mL — ABNORMAL HIGH (ref 0.0–100.0)

## 2023-03-26 LAB — APTT: aPTT: 41 seconds — ABNORMAL HIGH (ref 24–36)

## 2023-03-26 LAB — LIPASE, BLOOD: Lipase: 23 U/L (ref 11–51)

## 2023-03-26 LAB — PROTIME-INR
INR: 1.7 — ABNORMAL HIGH (ref 0.8–1.2)
Prothrombin Time: 20.3 seconds — ABNORMAL HIGH (ref 11.4–15.2)

## 2023-03-26 LAB — LACTIC ACID, PLASMA
Lactic Acid, Venous: 8.9 mmol/L (ref 0.5–1.9)
Lactic Acid, Venous: 7.8 mmol/L (ref 0.5–1.9)

## 2023-03-26 LAB — CULTURE, BLOOD (ROUTINE X 2)

## 2023-03-26 MED ORDER — LACTATED RINGERS IV BOLUS (SEPSIS)
600.0000 mL | Freq: Once | INTRAVENOUS | Status: AC
  Administered 2023-03-26: 600 mL via INTRAVENOUS

## 2023-03-26 MED ORDER — MORPHINE BOLUS VIA INFUSION
5.0000 mg | INTRAVENOUS | Status: DC | PRN
  Administered 2023-03-26 (×3): 5 mg via INTRAVENOUS

## 2023-03-26 MED ORDER — MORPHINE 100MG IN NS 100ML (1MG/ML) PREMIX INFUSION
0.0000 mg/h | INTRAVENOUS | Status: DC
  Administered 2023-03-26: 5 mg/h via INTRAVENOUS
  Administered 2023-03-27 – 2023-03-30 (×13): 15 mg/h via INTRAVENOUS
  Filled 2023-03-26 (×14): qty 100

## 2023-03-26 MED ORDER — ACETAMINOPHEN 325 MG PO TABS: 650.0000 mg | ORAL_TABLET | Freq: Four times a day (QID) | ORAL | Status: DC | PRN

## 2023-03-26 MED ORDER — ACETAMINOPHEN 650 MG RE SUPP: 650.0000 mg | Freq: Four times a day (QID) | RECTAL | Status: DC | PRN

## 2023-03-26 MED ORDER — NOREPINEPHRINE 4 MG/250ML-% IV SOLN
2.0000 ug/min | INTRAVENOUS | Status: DC
  Administered 2023-03-26: 12 ug/min via INTRAVENOUS
  Administered 2023-03-26: 2 ug/min via INTRAVENOUS
  Filled 2023-03-26 (×2): qty 250

## 2023-03-26 MED ORDER — MIDAZOLAM HCL 2 MG/2ML IJ SOLN
INTRAMUSCULAR | Status: AC
  Filled 2023-03-26: qty 2

## 2023-03-26 MED ORDER — FENTANYL CITRATE (PF) 100 MCG/2ML IJ SOLN
INTRAMUSCULAR | Status: AC
  Filled 2023-03-26: qty 2

## 2023-03-26 MED ORDER — LACTATED RINGERS IV BOLUS (SEPSIS)
1000.0000 mL | Freq: Once | INTRAVENOUS | Status: AC
  Administered 2023-03-26: 1000 mL via INTRAVENOUS

## 2023-03-26 MED ORDER — LIDOCAINE HCL 1 % IJ SOLN
INTRAMUSCULAR | Status: AC
  Filled 2023-03-26: qty 20

## 2023-03-26 MED ORDER — IOHEXOL 300 MG/ML  SOLN: 50.0000 mL | Freq: Once | INTRAMUSCULAR | Status: DC | PRN

## 2023-03-26 MED ORDER — GLYCOPYRROLATE 0.2 MG/ML IJ SOLN
0.2000 mg | INTRAMUSCULAR | Status: DC | PRN
  Filled 2023-03-26: qty 1

## 2023-03-26 MED ORDER — SODIUM CHLORIDE 0.9 % IV SOLN: INTRAVENOUS | Status: DC

## 2023-03-26 MED ORDER — SODIUM CHLORIDE 0.9 % IV SOLN: 250.0000 mL | INTRAVENOUS | Status: DC

## 2023-03-26 MED ORDER — SODIUM CHLORIDE 0.9 % IV SOLN: 2.0000 g | INTRAVENOUS | Status: DC

## 2023-03-26 MED ORDER — ORAL CARE MOUTH RINSE
15.0000 mL | OROMUCOSAL | Status: DC
  Administered 2023-03-27 – 2023-03-30 (×14): 15 mL via OROMUCOSAL

## 2023-03-26 MED ORDER — GLYCOPYRROLATE 0.2 MG/ML IJ SOLN
0.2000 mg | INTRAMUSCULAR | Status: DC | PRN
  Administered 2023-03-30 (×2): 0.2 mg via INTRAVENOUS
  Filled 2023-03-26 (×2): qty 1

## 2023-03-26 MED ORDER — METRONIDAZOLE 500 MG/100ML IV SOLN
500.0000 mg | Freq: Once | INTRAVENOUS | Status: AC
  Administered 2023-03-26: 500 mg via INTRAVENOUS
  Filled 2023-03-26: qty 100

## 2023-03-26 MED ORDER — GLYCOPYRROLATE 1 MG PO TABS: 1.0000 mg | ORAL_TABLET | ORAL | Status: DC | PRN

## 2023-03-26 MED ORDER — LACTATED RINGERS IV SOLN: INTRAVENOUS | Status: DC

## 2023-03-26 MED ORDER — ORAL CARE MOUTH RINSE: 15.0000 mL | OROMUCOSAL | Status: DC | PRN

## 2023-03-26 MED ORDER — VANCOMYCIN HCL IN DEXTROSE 1-5 GM/200ML-% IV SOLN
1000.0000 mg | Freq: Once | INTRAVENOUS | Status: AC
  Administered 2023-03-26: 1000 mg via INTRAVENOUS
  Filled 2023-03-26: qty 200

## 2023-03-26 MED ORDER — POLYETHYLENE GLYCOL 3350 17 G PO PACK: 17.0000 g | PACK | Freq: Every day | ORAL | Status: DC | PRN

## 2023-03-26 MED ORDER — IOHEXOL 350 MG/ML SOLN
60.0000 mL | Freq: Once | INTRAVENOUS | Status: AC | PRN
  Administered 2023-03-26: 60 mL via INTRAVENOUS

## 2023-03-26 MED ORDER — SODIUM CHLORIDE 0.9 % IV SOLN
2.0000 g | Freq: Once | INTRAVENOUS | Status: AC
  Administered 2023-03-26: 2 g via INTRAVENOUS
  Filled 2023-03-26: qty 12.5

## 2023-03-26 MED ORDER — POLYVINYL ALCOHOL 1.4 % OP SOLN: 1.0000 [drp] | Freq: Four times a day (QID) | OPHTHALMIC | Status: DC | PRN

## 2023-03-26 MED ORDER — DOCUSATE SODIUM 100 MG PO CAPS: 100.0000 mg | ORAL_CAPSULE | Freq: Two times a day (BID) | ORAL | Status: DC | PRN

## 2023-03-26 MED ORDER — LIDOCAINE HCL 1 % IJ SOLN
20.0000 mL | Freq: Once | INTRAMUSCULAR | Status: DC
  Filled 2023-03-26: qty 20

## 2023-03-26 NOTE — IPAL (Cosign Needed)
  Interdisciplinary Goals of Care Family Meeting   Date carried out: 03/26/2023  Location of the meeting: Bedside  Member's involved: Nurse Practitioner and Family Member or next of kin  Durable Power of Attorney or acting medical decision maker: Sons Randy and Child psychotherapist.     Discussion: We discussed goals of care for Trevor Phillips .  This has been an ongoing discussion over the course of the evening in the setting of progressive decline. Most recently the patient was taken to IR for nephrostomy, but could not tolerate lateral positioning to allow for procedure without dropping his sats and pressures. Procedure aborted. I offered conservative approach overnight vs comfort care and the family has elected to pursue comfort care at this time.   Code status:   Code Status: DNR   Disposition: In-patient comfort care  Time spent for the meeting: 25 min    Duayne Cal, NP  03/26/2023, 7:19 PM

## 2023-03-26 NOTE — Progress Notes (Signed)
Pt currently in IR. RN to place consult upon arrival to floor.

## 2023-03-26 NOTE — ED Notes (Signed)
Patient being transferred to IR with ER nurse, RT, and IR team.

## 2023-03-26 NOTE — ED Notes (Signed)
RT placed patient on bipap

## 2023-03-26 NOTE — Consult Note (Signed)
Chief Complaint: Urosepsis due to left distal obstructing ureteral calculus.  Referring Physician(s): Dr. Meridee Score  Patient Status: [facility] - ED  History of Present Illness: Trevor Phillips is a 89 y.o. male with history advanced dementia who was sent to the ED due to hypoxia, elevated respiratory rate, and mental status change.  Laboratory workup showed elevated lactic acid and troponin.  CT scan of the abdomen and pelvis showed left hydronephrosis due to distal obstructing calculus.  IR consulted for left percutaneous nephrostomy drain placement.   Patient is unable to provide history due to AMS.    Past Medical History:  Diagnosis Date   Anemia due to acute blood loss    gross hematuria   Bladder neck contracture    chronic   Diverticulosis of colon    Foley catheter in place    placed 07-11-2016   Gross hematuria    History of cerebral infarction    per CT 05-18-2012  chronic lacunar infarcts in basal ganglia  (silent?,  pt denies history S&S)   History of colon polyps    2004   History of diverticulitis    2014   History of prostate cancer 2003   s/p  radical prostatectomy 03-21-2002--  no recurrence (last PSA 07/2016  0.01)   History of recurrent UTIs    History of urinary retention    Neurogenic bladder    since prostatectomy 2003   Self-catheterizes urinary bladder    SECONDARY TO NEUROGENIC BLADDER    Urinary retention    Wears glasses     Past Surgical History:  Procedure Laterality Date   CATARACT EXTRACTION W/ INTRAOCULAR LENS  IMPLANT, BILATERAL  2010   CYSTO/  DILATATION BLADDER NECK CONTRACTURE  06/02/2002   CYSTOSCOPY W/ RETROGRADES  07/21/2016   Procedure: CYSTOSCOPY WITH RETROGRADE PYELOGRAM, CLOT EVACUATION, BLADDER BIOPSY WITH FULGERATION;  Surgeon: Jerilee Field, MD;  Location: [facility];  Service: Urology;;  0.5 TO 2 CM    EYE SURGERY Right yrs ago   PROSTATE SURGERY  yrs ago   RADICAL RETROPUBIC PROSTATECTOMY /  REPAIR  RECTAL PERFORATION  03-21-2002  dr Windy Fast davis   TRANSURETHRAL RESECTION OF PROSTATE  09/06/2001    Allergies: Patient has no known allergies.  Medications: Prior to Admission medications   Medication Sig Start Date End Date Taking? Authorizing Provider  acetaminophen (TYLENOL) 325 MG tablet Take 2 tablets (650 mg total) by mouth every 4 (four) hours as needed for mild pain (or temp > 37.5 C (99.5 F)). Patient taking differently: Take 650 mg by mouth every 4 (four) hours as needed (pain, temperature > 99.63F). 12/01/22   Lonia Blood, MD  atorvastatin (LIPITOR) 40 MG tablet Take 1 tablet (40 mg total) by mouth daily. Patient taking differently: Take 40 mg by mouth at bedtime. 12/02/22   Lonia Blood, MD  bisacodyl (DULCOLAX) 10 MG suppository Place 10 mg rectally daily as needed (constipation not relieved by Milk of Magnesia).    [provider]  Cholecalciferol (VITAMIN D3) 50 MCG (2000 UT) CAPS Take 2,000 Units by mouth daily.    [provider]  donepezil (ARICEPT) 10 MG tablet Take 10 mg by mouth at bedtime.    [provider]  ezetimibe (ZETIA) 10 MG tablet Take 10 mg by mouth every evening.     [provider]  ferrous sulfate 325 (65 FE) MG EC tablet Take 325 mg by mouth daily.    [provider]  Magnesium Hydroxide (MILK OF MAGNESIA PO) Take 30 mLs by mouth daily as needed (constipation).    [provider]  melatonin 5 MG TABS Take 5 mg by mouth at bedtime.    [provider]  methenamine (HIPREX) 1 G tablet Take 0.5 g by mouth 2 (two) times daily.    [provider]  Nutritional Supplements (NUTRITIONAL DRINK) LIQD Take 120 mLs by mouth 2 (two) times daily. Med Pass    [provider]  Sodium Phosphates (RA SALINE ENEMA RE) Place 1 enema rectally daily as needed (constipation not relieved by bisacodyl suppository.).    [provider]     History reviewed. No pertinent family  history.  Social History   Socioeconomic History   Marital status: Married    Spouse name: Not on file   Number of children: Not on file   Years of education: Not on file   Highest education level: Not on file  Occupational History   Not on file  Tobacco Use   Smoking status: Former    Packs/day: 1.00    Years: 8.00    Additional pack years: 0.00    Total pack years: 8.00    Types: Cigarettes    Quit date: 07/16/1957    Years since quitting: 65.7   Smokeless tobacco: Former    Types: Snuff    Quit date: 07/17/1979  Substance and Sexual Activity   Alcohol use: Yes    Comment: "very little"   Drug use: No   Sexual activity: Not on file  Other Topics Concern   Not on file  Social History Narrative   Not on file   Social Determinants of Health   Financial Resource Strain: Not on file  Food Insecurity: No Food Insecurity (11/26/2022)   Hunger Vital Sign    Worried About Running Out of Food in the Last Year: Never true    Ran Out of Food in the Last Year: Never true  Transportation Needs: No Transportation Needs (11/26/2022)   PRAPARE - Administrator, Civil Service (Medical): No    Lack of Transportation (Non-Medical): No  Physical Activity: Not on file  Stress: Not on file  Social Connections: Not on file   Review of Systems: A 12 point ROS discussed and pertinent positives are indicated in the HPI above.  All other systems are negative.  Review of Systems  Vital Signs: BP (!) 102/59   Pulse (!) 112   Temp 98.3 F (36.8 C) (Oral)   Resp (!) 32   SpO2 97%   Advance Care Plan: The advanced care plan/surrogate decision maker was discussed at the time of visit and documented in the medical record.    Physical Exam  Imaging: CT ABDOMEN PELVIS W CONTRAST  Result Date: 03/26/2023 CLINICAL DATA:  Sepsis. EXAM: CT ABDOMEN AND PELVIS WITH CONTRAST TECHNIQUE: Multidetector CT imaging of the abdomen and pelvis was performed using the standard protocol  following bolus administration of intravenous contrast. RADIATION DOSE REDUCTION: This exam was performed according to the departmental dose-optimization program which includes automated exposure control, adjustment of the mA and/or kV according to patient size and/or use of iterative reconstruction technique. CONTRAST:  60mL OMNIPAQUE IOHEXOL 350 MG/ML SOLN COMPARISON:  CT of the abdomen and pelvis without contrast 3/13/4 FINDINGS: Lower chest: Mild atelectasis is present at the lung bases, left greater than right. Heart size is normal. Coronary artery calcifications are present. Hepatobiliary: No focal liver abnormality is seen. No gallstones, gallbladder  wall thickening, or biliary dilatation. Pancreas: Unremarkable. No pancreatic ductal dilatation or surrounding inflammatory changes. Spleen: Normal in size without focal abnormality. Adrenals/Urinary Tract: Adrenal glands are normal bilaterally. Mild left-sided hydronephrosis is again noted. The slightly delayed nephrogram is present on the left. The previously seen proximal left ureteral stone has migrated to the distal ureter. 5 mm obstructing stone is present with mild dilation inflammatory changes of the ureter proximal to the level. The right ureter is within normal limits. The urinary bladder is collapsed with a Foley catheter in place. Stomach/Bowel: The stomach and duodenum are within normal limits. The small bowel is unremarkable. The terminal ileum is within normal limits. The ascending and transverse colon are normal. Diverticular changes are present in the descending and sigmoid colon without focal inflammation to suggest diverticulitis. Vascular/Lymphatic: Extensive atherosclerotic calcifications are again noted within the aorta branch vessels. No aneurysm is present. Reproductive: Prostate is unremarkable. Other: Vertebral body heights and alignment are normal. Degenerative facet changes are greatest at L3-4. No focal osseous lesions are present.  The bony pelvis is within normal limits. Degenerative changes are noted in the hips bilaterally. Musculoskeletal: Vertebral body heights and alignment are maintained. Slight leftward curvature is present in the lumbar spine. The hips are located bilaterally. Moderate degenerative changes are present both hips. No focal osseous lesions are present. IMPRESSION: 1. Obstructing 5 mm stone in the distal left ureter with mild left-sided hydronephrosis. 2. Slightly delayed nephrogram on the left. 3. Descending and sigmoid diverticulosis without diverticulitis. 4. Coronary artery disease. 5. Moderate degenerative changes in the hips bilaterally. 6.  Aortic Atherosclerosis (ICD10-I70.0). Electronically Signed   By: Marin Roberts M.D.   On: 03/26/2023 16:22   CT Angio Chest PE W/Cm &/Or Wo Cm  Result Date: 03/26/2023 CLINICAL DATA:  Pulmonary embolism suspected, high probability. Proxy a. Tachycardia. EXAM: CT ANGIOGRAPHY CHEST WITH CONTRAST TECHNIQUE: Multidetector CT imaging of the chest was performed using the standard protocol during bolus administration of intravenous contrast. Multiplanar CT image reconstructions and MIPs were obtained to evaluate the vascular anatomy. RADIATION DOSE REDUCTION: This exam was performed according to the departmental dose-optimization program which includes automated exposure control, adjustment of the mA and/or kV according to patient size and/or use of iterative reconstruction technique. CONTRAST:  60mL OMNIPAQUE IOHEXOL 350 MG/ML SOLN COMPARISON:  Chronic view chest x-ray 03/26/2023 FINDINGS: Cardiovascular: The heart size is normal. Extensive coronary artery calcifications are present. Atherosclerotic calcifications are present the aortic arch great vessel origins. Narrowing of less than 50% is present at the proximal subclavian artery. No aneurysm is present. Pulmonary artery opacification is excellent. Focal filling defects are present to suggest pulmonary embolus.  Pulmonary artery size is normal. Mediastinum/Nodes: No enlarged mediastinal, hilar, or axillary lymph nodes. Thyroid gland, trachea, and esophagus demonstrate no significant findings. Lungs/Pleura: Mild dependent atelectasis present bilaterally. Lungs are otherwise clear. No significant pleural effusion or pneumothorax is present. Upper Abdomen: Limited imaging the abdomen is unremarkable. There is no significant adenopathy. No solid organ lesions are present. Musculoskeletal: Are curvature is present in the thoracic spine. Vertebral body heights normal. No listhesis is present. Ribs are unremarkable. Review of the MIP images confirms the above findings. IMPRESSION: 1. No pulmonary embolus. 2. Extensive coronary artery disease. 3. Scoliosis of the thoracic spine. 4.  Aortic Atherosclerosis (ICD10-I70.0). Electronically Signed   By: Marin Roberts M.D.   On: 03/26/2023 16:09   DG Chest Port 1 View  Result Date: 03/26/2023 CLINICAL DATA:  Hypoxia, tachycardia EXAM: PORTABLE CHEST 1 VIEW  COMPARISON:  03/22/2023 FINDINGS: Unchanged cardiac and mediastinal contours. No focal pulmonary opacity. No pleural effusion or pneumothorax. No acute osseous abnormality. IMPRESSION: No acute cardiopulmonary process. Electronically Signed   By: Wiliam Ke M.D.   On: 03/26/2023 12:42    Labs:  CBC: Recent Labs    01/20/23 0805 01/20/23 1950 01/21/23 0241 01/22/23 0651 03/26/23 1200 03/26/23 1254 03/26/23 1256  WBC 16.1*  --  13.1* 11.6* 22.9*  --   --   HGB 9.1*   < > 8.5* 8.4* 10.3* 10.5* 10.2*  HCT 29.4*   < > 26.7* 26.4* 32.5* 31.0* 30.0*  PLT 393  --  333 340 309  --   --    < > = values in this interval not displayed.    COAGS: Recent Labs    11/24/22 1535 01/20/23 0133 03/26/23 1200  INR 1.3* 1.4* 1.7*  APTT 29  --  41*    BMP: Recent Labs    01/20/23 1950 01/21/23 0241 01/22/23 0651 03/26/23 1200 03/26/23 1254 03/26/23 1256  NA 143 144 143 138 140 141  K 3.5 3.4* 3.5 3.2*  3.3* 3.3*  CL 114* 118* 119* 107  --  105  CO2 18* 19* 19* 16*  --   --   GLUCOSE 87 79 97 157*  --  152*  BUN 39* 38* 30* 29*  --  27*  CALCIUM 8.1* 7.7* 7.7* 8.5*  --   --   CREATININE 1.47* 1.44* 1.31* 1.90*  --  1.60*  GFRNONAA 45* 46* 52* 33*  --   --     LIVER FUNCTION TESTS: Recent Labs    11/24/22 1535 01/20/23 1950 03/26/23 1200  BILITOT 2.0* 0.7 1.3*  AST 21 68* 71*  ALT 20 97* 48*  ALKPHOS 54 83 199*  PROT 6.9 5.5* 6.0*  ALBUMIN 4.1 2.1* 2.5*    TUMOR MARKERS: No results for input(s): "AFPTM", "CEA", "CA199", "CHROMGRNA" in the last 8760 hours.  Assessment and Plan:  89 year old man with urosepsis due to left ureteral obstructing calculus.  IR consulted for left PCN placement. I discussed the procedure with patient's son Harvie Heck.    Risks and benefits of left PCN placement was discussed with his son including, but not limited to, infection, bleeding, significant bleeding causing loss or decrease in renal function or damage to adjacent structures.  We also discussed the patient's code status.  He did not want [place] to perform CPR or Intubation in case of cardiac or respiratory arrest during the procedure.  All his questions were answered, patient is agreeable to proceed.  Consent signed and in chart.  Thank you for this interesting consult.  I greatly enjoyed meeting HAIZE OSSMAN and look forward to participating in their care.  A copy of this report was sent to the requesting provider on this date.  Electronically Signed: Al Corpus Rayshard Schirtzinger, MD 03/26/2023, 6:25 PM   I spent a total of  30 Minutes  in face to face in clinical consultation, greater than 50% of which was counseling/coordinating care for Left hydronephrosis.

## 2023-03-26 NOTE — Progress Notes (Signed)
Trevor Phillips was brought to the IR suite, however he became severely hypotensive and hypoxic while down on his right side despite increasing doses of Levophed and 100% through BiPap.  He is not stable enough to undergo this procedure.  PA Joneen Roach came to IR and evaluated the patient with me. We agreed that the patient was not a candidate for the procedure.  I discussed these findings and recommendation with the patient's son.  All his questions were answered.  Mauri Reading Petar Mucci, MD [phone]

## 2023-03-26 NOTE — ED Triage Notes (Signed)
From [place]. NP called for low oxygen saturation and increased HR and respirations. Patient placed on 3L by [facility], patient does not normally use oxygen. 86-87% per EMS. Per EMS, [facility] is concerned for sepsis.

## 2023-03-26 NOTE — Consult Note (Deleted)
NAME:  Trevor Phillips, MRN:  [id number], DOB:  17-May-1931, LOS: 0 ADMISSION DATE:  03/26/2023, CONSULTATION DATE: 5/17 REFERRING MD: Dr. Charm Barges EDP, CHIEF COMPLAINT: Lactic acid elevation  History of Present Illness:  89 year old male with past medical history as below, which is significant for neurogenic bladder following prostate cancer, chronic Foley, dementia, SNF resident.  He was recently seen in the [facility] emergency department on 5/13 after dislodging his chronic Foley catheter.  He was a difficult replacement and required urology to place a coud catheter over guidewire.  UA at that time was marginal and the patient was discharged to SNF.  He again presented to [facility] emergency department on 5/17 with complaints from SNF staff of altered mental status and hypoxia.  The patient was hypoxic with oxygen saturation is 86 to 87% for EMS.  Upon arrival to the emergency department patient oxygen saturations were 100% on room air.  Laboratory evaluation significant lactic acid 8.9, potassium 3.2, creatinine 1.9, BUN 29, BNP 755, and troponin 7051.  Cardiology was consulted by the emergency physician for elevated troponin.  PCCM was consulted for elevated lactic.   Pertinent  Medical History   has a past medical history of Anemia due to acute blood loss, Bladder neck contracture, Diverticulosis of colon, Foley catheter in place, Gross hematuria, History of cerebral infarction, History of colon polyps, History of diverticulitis, History of prostate cancer (2003), History of recurrent UTIs, History of urinary retention, Neurogenic bladder, Self-catheterizes urinary bladder, Urinary retention, and Wears glasses.   Significant Hospital Events: Including procedures, antibiotic start and stop dates in addition to other pertinent events   5/13 presented to ED after Foley dislodged.  Difficult replacement by urology. 5/17 presented to ED for hypoxia and altered mental status.  Admitted  Interim  History / Subjective:    Objective   Blood pressure (!) 154/108, pulse (!) 131, temperature 98.3 F (36.8 C), temperature source Oral, resp. rate (!) 31, SpO2 98 %.       No intake or output data in the 24 hours ending 03/26/23 1509 There were no vitals filed for this visit.  Examination: General: Frail elderly gentleman in mild distress HENT: Normocephalic, atraumatic, PERRL, no JVD..  Mucous membranes dry. Lungs: Clear bilateral breath sounds.  Satting 100% on room air. Cardiovascular: Tachycardic, regular, no MRG. Abdomen: Soft, nondistended, generalized tenderness Extremities: No acute deformity, no edema.  Tremor Neuro: Spontaneous awake, alert, disoriented, poor historian. GU: Chronic Foley draining cloudy straw colored urine.  Resolved Hospital Problem list     Assessment & Plan:   Lactic acidosis: Etiology not entirely certain. He is complaining of abdominal tenderness so perhaps an intraabdominal issue like bowel ischemia. CXR negative.  - Trend lactic 8.9 >>> - Hydrate - CT abdomen pending  Neurogenic bladder - Continue foley. Do not exchange. Will need urology to change over a wire if necessary. - Recent hematuria > take care with starting [facility] - Send UA and culture  SIRS/Sepsis: urinary vs intraabdominal source.  - UA and culture pending - Antibiotics per primary - CT pending  Elevated troponin - Cardiology consulted - trend Trop 7050 >>> - considering heparin infusion   Hypoxia: resolved upon my evaluation in ED. Sats 100% on room air. CXR negative. - PRN oxygen to keep sats 90% - No role for CTA - Pulmonary hygiene  AMS: baseline mental status uncertain, but me may not be too far off.  - Supportive care     Best Practice (right click  and "Reselect all SmartList Selections" daily)   Per primary  Diet/type:  DVT prophylaxis:  GI prophylaxis:  Lines:  Foley:   Code Status:   Last date of multidisciplinary goals of care discussion  Labs    CBC: Recent Labs  Lab 03/26/23 1200 03/26/23 1254 03/26/23 1256  WBC 22.9*  --   --   NEUTROABS 21.9*  --   --   HGB 10.3* 10.5* 10.2*  HCT 32.5* 31.0* 30.0*  MCV 96.2  --   --   PLT 309  --   --     Basic Metabolic Panel: Recent Labs  Lab 03/26/23 1200 03/26/23 1254 03/26/23 1256  NA 138 140 141  K 3.2* 3.3* 3.3*  CL 107  --  105  CO2 16*  --   --   GLUCOSE 157*  --  152*  BUN 29*  --  27*  CREATININE 1.90*  --  1.60*  CALCIUM 8.5*  --   --    GFR: Estimated Creatinine Clearance: 23 mL/min (A) (by C-G formula based on SCr of 1.6 mg/dL (H)). Recent Labs  Lab 03/26/23 1200  WBC 22.9*  LATICACIDVEN 8.9*    Liver Function Tests: Recent Labs  Lab 03/26/23 1200  AST 71*  ALT 48*  ALKPHOS 199*  BILITOT 1.3*  PROT 6.0*  ALBUMIN 2.5*   Recent Labs  Lab 03/26/23 1200  LIPASE 23   No results for input(s): "AMMONIA" in the last 168 hours.  ABG    Component Value Date/Time   HCO3 15.3 (L) 03/26/2023 1254   TCO2 17 (L) 03/26/2023 1256   ACIDBASEDEF 9.0 (H) 03/26/2023 1254   O2SAT 43 03/26/2023 1254     Coagulation Profile: Recent Labs  Lab 03/26/23 1200  INR 1.7*    Cardiac Enzymes: No results for input(s): "CKTOTAL", "CKMB", "CKMBINDEX", "TROPONINI" in the last 168 hours.  HbA1C: Hgb A1c MFr Bld  Date/Time Value Ref Range Status  11/24/2022 09:50 PM 5.2 4.8 - 5.6 % Final    Comment:    (NOTE) Pre diabetes:          5.7%-6.4%  Diabetes:              >6.4%  Glycemic control for   <7.0% adults with diabetes     CBG: No results for input(s): "GLUCAP" in the last 168 hours.  Review of Systems:   Patient is encephalopathic and/or intubated; therefore, history has been obtained from chart review.    Past Medical History:  He,  has a past medical history of Anemia due to acute blood loss, Bladder neck contracture, Diverticulosis of colon, Foley catheter in place, Gross hematuria, History of cerebral infarction, History of colon polyps,  History of diverticulitis, History of prostate cancer (2003), History of recurrent UTIs, History of urinary retention, Neurogenic bladder, Self-catheterizes urinary bladder, Urinary retention, and Wears glasses.   Surgical History:   Past Surgical History:  Procedure Laterality Date   CATARACT EXTRACTION W/ INTRAOCULAR LENS  IMPLANT, BILATERAL  2010   CYSTO/  DILATATION BLADDER NECK CONTRACTURE  06/02/2002   CYSTOSCOPY W/ RETROGRADES  07/21/2016   Procedure: CYSTOSCOPY WITH RETROGRADE PYELOGRAM, CLOT EVACUATION, BLADDER BIOPSY WITH FULGERATION;  Surgeon: Jerilee Field, MD;  Location: [facility];  Service: Urology;;  0.5 TO 2 CM    EYE SURGERY Right yrs ago   PROSTATE SURGERY  yrs ago   RADICAL RETROPUBIC PROSTATECTOMY /  REPAIR RECTAL PERFORATION  03-21-2002  dr Gaynelle Arabian   TRANSURETHRAL  RESECTION OF PROSTATE  09/06/2001     Social History:   reports that he quit smoking about 65 years ago. His smoking use included cigarettes. He has a 8.00 pack-year smoking history. He quit smokeless tobacco use about 43 years ago.  His smokeless tobacco use included snuff. He reports current alcohol use. He reports that he does not use drugs.   Family History:  His family history is not on file.   Allergies No Known Allergies   Home Medications  Prior to Admission medications   Medication Sig Start Date End Date Taking? Authorizing Provider  acetaminophen (TYLENOL) 325 MG tablet Take 2 tablets (650 mg total) by mouth every 4 (four) hours as needed for mild pain (or temp > 37.5 C (99.5 F)). Patient taking differently: Take 650 mg by mouth every 4 (four) hours as needed (pain, temperature > 99.78F). 12/01/22   Lonia Blood, MD  atorvastatin (LIPITOR) 40 MG tablet Take 1 tablet (40 mg total) by mouth daily. Patient taking differently: Take 40 mg by mouth at bedtime. 12/02/22   Lonia Blood, MD  bisacodyl (DULCOLAX) 10 MG suppository Place 10 mg rectally daily as needed  (constipation not relieved by Milk of Magnesia).    [provider]  Cholecalciferol (VITAMIN D3) 50 MCG (2000 UT) CAPS Take 2,000 Units by mouth daily.    [provider]  donepezil (ARICEPT) 10 MG tablet Take 10 mg by mouth at bedtime.    [provider]  ezetimibe (ZETIA) 10 MG tablet Take 10 mg by mouth every evening.     [provider]  ferrous sulfate 325 (65 FE) MG EC tablet Take 325 mg by mouth daily.    [provider]  Magnesium Hydroxide (MILK OF MAGNESIA PO) Take 30 mLs by mouth daily as needed (constipation).    [provider]  melatonin 5 MG TABS Take 5 mg by mouth at bedtime.    [provider]  methenamine (HIPREX) 1 G tablet Take 0.5 g by mouth 2 (two) times daily.    [provider]  Nutritional Supplements (NUTRITIONAL DRINK) LIQD Take 120 mLs by mouth 2 (two) times daily. Med Pass    [provider]  Sodium Phosphates (RA SALINE ENEMA RE) Place 1 enema rectally daily as needed (constipation not relieved by bisacodyl suppository.).    [provider]     Critical care time:       Joneen Roach, AGACNP-BC [facility]  See Amion for personal pager PCCM on call pager [phone] until 7pm. Please call Elink 7p-7a. [phone]  03/26/2023 3:57 PM

## 2023-03-26 NOTE — Consult Note (Signed)
Urology Consult   Physician requesting consult: Meridee Score, MD  Reason for consult: Sepsis, ureteral calculus  History of Present Illness: Trevor Phillips is a 89 y.o. male seen in consultation from Dr. Charm Barges for evaluation of sepsis and a left ureteral calculus.  He has multiple medical problems including dementia.  He has a history of a prostatectomy with subsequent bladder neck contracture.  He has had an indwelling Foley catheter which was recently replaced.  He presented to the emergency room today with shortness of breath and was noted to be tachycardic and hypotensive.  Laboratory data showed an elevated lactic acid, creatinine elevated to 1.90, WBC of 22.9 K, elevated troponin, and urinalysis with >50 WBCs and many bacteria.  CT imaging showed mild left-sided hydronephrosis with slightly delayed nephrogram and a 5 mm obstructing stone in the distal left ureter.  Urologic consultation was requested for the ureteral calculus and UTI with sepsis.  Given the patient's critical condition, placement of a left nephrostomy tube was recommended.  The patient became hypotensive and hypoxic in the radiology suite and was unable to undergo the procedure.  He was admitted to the ICU where he continued on pressor support.  He has since been changed to comfort care only.   Past Medical History:  Diagnosis Date   Anemia due to acute blood loss    gross hematuria   Bladder neck contracture    chronic   Diverticulosis of colon    Foley catheter in place    placed 07-11-2016   Gross hematuria    History of cerebral infarction    per CT 05-18-2012  chronic lacunar infarcts in basal ganglia  (silent?,  pt denies history S&S)   History of colon polyps    2004   History of diverticulitis    2014   History of prostate cancer 2003   s/p  radical prostatectomy 03-21-2002--  no recurrence (last PSA 07/2016  0.01)   History of recurrent UTIs    History of urinary retention    Neurogenic bladder     since prostatectomy 2003   Self-catheterizes urinary bladder    SECONDARY TO NEUROGENIC BLADDER    Urinary retention    Wears glasses     Past Surgical History:  Procedure Laterality Date   CATARACT EXTRACTION W/ INTRAOCULAR LENS  IMPLANT, BILATERAL  2010   CYSTO/  DILATATION BLADDER NECK CONTRACTURE  06/02/2002   CYSTOSCOPY W/ RETROGRADES  07/21/2016   Procedure: CYSTOSCOPY WITH RETROGRADE PYELOGRAM, CLOT EVACUATION, BLADDER BIOPSY WITH FULGERATION;  Surgeon: Jerilee Field, MD;  Location: [facility];  Service: Urology;;  0.5 TO 2 CM    EYE SURGERY Right yrs ago   PROSTATE SURGERY  yrs ago   RADICAL RETROPUBIC PROSTATECTOMY /  REPAIR RECTAL PERFORATION  03-21-2002  dr Windy Fast davis   TRANSURETHRAL RESECTION OF PROSTATE  09/06/2001    Medications:  Scheduled Meds:  lidocaine  20 mL Intradermal Once   Continuous Infusions:  sodium chloride Stopped (03/26/23 2012)   sodium chloride Stopped (03/26/23 2011)   cefTRIAXone (ROCEPHIN)  IV Stopped (03/26/23 2012)   morphine 10 mg/hr (03/26/23 2100)   norepinephrine (LEVOPHED) Adult infusion 15 mcg/min (03/26/23 2100)   PRN Meds:.acetaminophen **OR** acetaminophen, docusate sodium, glycopyrrolate **OR** glycopyrrolate **OR** glycopyrrolate, iohexol, morphine, polyethylene glycol, polyvinyl alcohol  Allergies: No Known Allergies  History reviewed. No pertinent family history.  Social History:  reports that he quit smoking about 65 years ago. His smoking use included cigarettes. He has a  8.00 pack-year smoking history. He quit smokeless tobacco use about 43 years ago.  His smokeless tobacco use included snuff. He reports current alcohol use. He reports that he does not use drugs.  ROS: Unable to obtain due to altered mental status.  Physical Exam:  Vital signs in last 24 hours: Temp:  [98.3 F (36.8 C)] 98.3 F (36.8 C) (05/17 1150) Pulse Rate:  [38-132] 108 (05/17 2100) Resp:  [27-40] 27 (05/17 2100) BP:  (74-157)/(47-108) 117/67 (05/17 1900) SpO2:  [63 %-100 %] 100 % (05/17 2100) FiO2 (%):  [100 %] 100 % (05/17 1957) GENERAL APPEARANCE:  Well appearing, well developed, well nourished, NAD HEENT:  Atraumatic, normocephalic, oropharynx clear NECK:  Supple without lymphadenopathy or thyromegaly ABDOMEN:  Soft, non-tender, no masses EXTREMITIES:  Moves all extremities well, without clubbing, cyanosis, or edema NEUROLOGIC:  Alert and oriented x 3, CN II-XII grossly intact MENTAL STATUS:  altered BACK:  Non-tender to palpation, No CVAT SKIN:  Warm, dry, and intact  Laboratory Data:  Recent Labs    03/26/23 1200 03/26/23 1254 03/26/23 1256  WBC 22.9*  --   --   HGB 10.3* 10.5* 10.2*  HCT 32.5* 31.0* 30.0*  PLT 309  --   --     Recent Labs    03/26/23 1200 03/26/23 1254 03/26/23 1256  NA 138 140 141  K 3.2* 3.3* 3.3*  CL 107  --  105  GLUCOSE 157*  --  152*  BUN 29*  --  27*  CALCIUM 8.5*  --   --   CREATININE 1.90*  --  1.60*     Results for orders placed or performed during the hospital encounter of 03/26/23 (from the past 24 hour(s))  Lactic acid, plasma     Status: Abnormal   Collection Time: 03/26/23 12:00 PM  Result Value Ref Range   Lactic Acid, Venous 8.9 (HH) 0.5 - 1.9 mmol/L  Comprehensive metabolic panel     Status: Abnormal   Collection Time: 03/26/23 12:00 PM  Result Value Ref Range   Sodium 138 135 - 145 mmol/L   Potassium 3.2 (L) 3.5 - 5.1 mmol/L   Chloride 107 98 - 111 mmol/L   CO2 16 (L) 22 - 32 mmol/L   Glucose, Bld 157 (H) 70 - 99 mg/dL   BUN 29 (H) 8 - 23 mg/dL   Creatinine, Ser 4.09 (H) 0.61 - 1.24 mg/dL   Calcium 8.5 (L) 8.9 - 10.3 mg/dL   Total Protein 6.0 (L) 6.5 - 8.1 g/dL   Albumin 2.5 (L) 3.5 - 5.0 g/dL   AST 71 (H) 15 - 41 U/L   ALT 48 (H) 0 - 44 U/L   Alkaline Phosphatase 199 (H) 38 - 126 U/L   Total Bilirubin 1.3 (H) 0.3 - 1.2 mg/dL   GFR, Estimated 33 (L) >60 mL/min   Anion gap 15 5 - 15  CBC with Differential     Status: Abnormal    Collection Time: 03/26/23 12:00 PM  Result Value Ref Range   WBC 22.9 (H) 4.0 - 10.5 K/uL   RBC 3.38 (L) 4.22 - 5.81 MIL/uL   Hemoglobin 10.3 (L) 13.0 - 17.0 g/dL   HCT 81.1 (L) 91.4 - 78.2 %   MCV 96.2 80.0 - 100.0 fL   MCH 30.5 26.0 - 34.0 pg   MCHC 31.7 30.0 - 36.0 g/dL   RDW 95.6 21.3 - 08.6 %   Platelets 309 150 - 400 K/uL   nRBC 0.0 0.0 -  0.2 %   Neutrophils Relative % 96 %   Neutro Abs 21.9 (H) 1.7 - 7.7 K/uL   Lymphocytes Relative 2 %   Lymphs Abs 0.5 (L) 0.7 - 4.0 K/uL   Monocytes Relative 0 %   Monocytes Absolute 0.1 0.1 - 1.0 K/uL   Eosinophils Relative 0 %   Eosinophils Absolute 0.0 0.0 - 0.5 K/uL   Basophils Relative 0 %   Basophils Absolute 0.1 0.0 - 0.1 K/uL   RBC Morphology See Note    Smear Review MORPHOLOGY UNREMARKABLE    Immature Granulocytes 2 %   Abs Immature Granulocytes 0.35 (H) 0.00 - 0.07 K/uL   Polychromasia PRESENT   Protime-INR     Status: Abnormal   Collection Time: 03/26/23 12:00 PM  Result Value Ref Range   Prothrombin Time 20.3 (H) 11.4 - 15.2 seconds   INR 1.7 (H) 0.8 - 1.2  APTT     Status: Abnormal   Collection Time: 03/26/23 12:00 PM  Result Value Ref Range   aPTT 41 (H) 24 - 36 seconds  Lipase, blood     Status: None   Collection Time: 03/26/23 12:00 PM  Result Value Ref Range   Lipase 23 11 - 51 U/L  Brain natriuretic peptide     Status: Abnormal   Collection Time: 03/26/23 12:00 PM  Result Value Ref Range   B Natriuretic Peptide 755.6 (H) 0.0 - 100.0 pg/mL  Troponin I (High Sensitivity)     Status: Abnormal   Collection Time: 03/26/23 12:00 PM  Result Value Ref Range   Troponin I (High Sensitivity) 7,051 (HH) <18 ng/L  SARS Coronavirus 2 by RT PCR (hospital order, performed in [facility] hospital lab) *cepheid single result test*     Status: None   Collection Time: 03/26/23 12:01 PM   Specimen: Nasal Swab  Result Value Ref Range   SARS Coronavirus 2 by RT PCR NEGATIVE NEGATIVE  I-Stat venous blood gas, ED ([facility])      Status: Abnormal   Collection Time: 03/26/23 12:54 PM  Result Value Ref Range   pH, Ven 7.342 7.25 - 7.43   pCO2, Ven 28.3 (L) 44 - 60 mmHg   pO2, Ven 25 (LL) 32 - 45 mmHg   Bicarbonate 15.3 (L) 20.0 - 28.0 mmol/L   TCO2 16 (L) 22 - 32 mmol/L   O2 Saturation 43 %   Acid-base deficit 9.0 (H) 0.0 - 2.0 mmol/L   Sodium 140 135 - 145 mmol/L   Potassium 3.3 (L) 3.5 - 5.1 mmol/L   Calcium, Ion 1.17 1.15 - 1.40 mmol/L   HCT 31.0 (L) 39.0 - 52.0 %   Hemoglobin 10.5 (L) 13.0 - 17.0 g/dL   Sample type VENOUS    Comment NOTIFIED PHYSICIAN   I-Stat Chem 8, ED     Status: Abnormal   Collection Time: 03/26/23 12:56 PM  Result Value Ref Range   Sodium 141 135 - 145 mmol/L   Potassium 3.3 (L) 3.5 - 5.1 mmol/L   Chloride 105 98 - 111 mmol/L   BUN 27 (H) 8 - 23 mg/dL   Creatinine, Ser 4.69 (H) 0.61 - 1.24 mg/dL   Glucose, Bld 629 (H) 70 - 99 mg/dL   Calcium, Ion 5.28 1.15 - 1.40 mmol/L   TCO2 17 (L) 22 - 32 mmol/L   Hemoglobin 10.2 (L) 13.0 - 17.0 g/dL   HCT 41.3 (L) 24.4 - 01.0 %  Lactic acid, plasma     Status: Abnormal   Collection  Time: 03/26/23  2:00 PM  Result Value Ref Range   Lactic Acid, Venous 7.8 (HH) 0.5 - 1.9 mmol/L  Troponin I (High Sensitivity)     Status: Abnormal   Collection Time: 03/26/23  2:01 PM  Result Value Ref Range   Troponin I (High Sensitivity) 8,342 (HH) <18 ng/L  Urinalysis, Routine w reflex microscopic -Urine, Catheterized; Indwelling urinary catheter     Status: Abnormal   Collection Time: 03/26/23  3:54 PM  Result Value Ref Range   Color, Urine AMBER (A) YELLOW   APPearance CLOUDY (A) CLEAR   Specific Gravity, Urine 1.017 1.005 - 1.030   pH 5.0 5.0 - 8.0   Glucose, UA NEGATIVE NEGATIVE mg/dL   Hgb urine dipstick MODERATE (A) NEGATIVE   Bilirubin Urine NEGATIVE NEGATIVE   Ketones, ur NEGATIVE NEGATIVE mg/dL   Protein, ur 295 (A) NEGATIVE mg/dL   Nitrite NEGATIVE NEGATIVE   Leukocytes,Ua LARGE (A) NEGATIVE   RBC / HPF 21-50 0 - 5 RBC/hpf   WBC, UA >50  0 - 5 WBC/hpf   Bacteria, UA MANY (A) NONE SEEN   Squamous Epithelial / HPF 0-5 0 - 5 /HPF   WBC Clumps PRESENT    Mucus PRESENT    Recent Results (from the past 240 hour(s))  SARS Coronavirus 2 by RT PCR (hospital order, performed in [facility] hospital lab) *cepheid single result test*     Status: None   Collection Time: 03/26/23 12:01 PM   Specimen: Nasal Swab  Result Value Ref Range Status   SARS Coronavirus 2 by RT PCR NEGATIVE NEGATIVE Final    Comment: Performed at [facility], [address], [place], [place] [address]    Renal Function: Recent Labs    03/26/23 1200 03/26/23 1256  CREATININE 1.90* 1.60*   Estimated Creatinine Clearance: 23 mL/min (A) (by C-G formula based on SCr of 1.6 mg/dL (H)).  Radiologic Imaging: CT ABDOMEN PELVIS W CONTRAST  Result Date: 03/26/2023 CLINICAL DATA:  Sepsis. EXAM: CT ABDOMEN AND PELVIS WITH CONTRAST TECHNIQUE: Multidetector CT imaging of the abdomen and pelvis was performed using the standard protocol following bolus administration of intravenous contrast. RADIATION DOSE REDUCTION: This exam was performed according to the departmental dose-optimization program which includes automated exposure control, adjustment of the mA and/or kV according to patient size and/or use of iterative reconstruction technique. CONTRAST:  60mL OMNIPAQUE IOHEXOL 350 MG/ML SOLN COMPARISON:  CT of the abdomen and pelvis without contrast 3/13/4 FINDINGS: Lower chest: Mild atelectasis is present at the lung bases, left greater than right. Heart size is normal. Coronary artery calcifications are present. Hepatobiliary: No focal liver abnormality is seen. No gallstones, gallbladder wall thickening, or biliary dilatation. Pancreas: Unremarkable. No pancreatic ductal dilatation or surrounding inflammatory changes. Spleen: Normal in size without focal abnormality. Adrenals/Urinary Tract: Adrenal glands are normal bilaterally. Mild left-sided hydronephrosis is again  noted. The slightly delayed nephrogram is present on the left. The previously seen proximal left ureteral stone has migrated to the distal ureter. 5 mm obstructing stone is present with mild dilation inflammatory changes of the ureter proximal to the level. The right ureter is within normal limits. The urinary bladder is collapsed with a Foley catheter in place. Stomach/Bowel: The stomach and duodenum are within normal limits. The small bowel is unremarkable. The terminal ileum is within normal limits. The ascending and transverse colon are normal. Diverticular changes are present in the descending and sigmoid colon without focal inflammation to suggest diverticulitis. Vascular/Lymphatic: Extensive atherosclerotic calcifications  are again noted within the aorta branch vessels. No aneurysm is present. Reproductive: Prostate is unremarkable. Other: Vertebral body heights and alignment are normal. Degenerative facet changes are greatest at L3-4. No focal osseous lesions are present. The bony pelvis is within normal limits. Degenerative changes are noted in the hips bilaterally. Musculoskeletal: Vertebral body heights and alignment are maintained. Slight leftward curvature is present in the lumbar spine. The hips are located bilaterally. Moderate degenerative changes are present both hips. No focal osseous lesions are present. IMPRESSION: 1. Obstructing 5 mm stone in the distal left ureter with mild left-sided hydronephrosis. 2. Slightly delayed nephrogram on the left. 3. Descending and sigmoid diverticulosis without diverticulitis. 4. Coronary artery disease. 5. Moderate degenerative changes in the hips bilaterally. 6.  Aortic Atherosclerosis (ICD10-I70.0). Electronically Signed   By: Marin Roberts M.D.   On: 03/26/2023 16:22   CT Angio Chest PE W/Cm &/Or Wo Cm  Result Date: 03/26/2023 CLINICAL DATA:  Pulmonary embolism suspected, high probability. Proxy a. Tachycardia. EXAM: CT ANGIOGRAPHY CHEST WITH  CONTRAST TECHNIQUE: Multidetector CT imaging of the chest was performed using the standard protocol during bolus administration of intravenous contrast. Multiplanar CT image reconstructions and MIPs were obtained to evaluate the vascular anatomy. RADIATION DOSE REDUCTION: This exam was performed according to the departmental dose-optimization program which includes automated exposure control, adjustment of the mA and/or kV according to patient size and/or use of iterative reconstruction technique. CONTRAST:  60mL OMNIPAQUE IOHEXOL 350 MG/ML SOLN COMPARISON:  Chronic view chest x-ray 03/26/2023 FINDINGS: Cardiovascular: The heart size is normal. Extensive coronary artery calcifications are present. Atherosclerotic calcifications are present the aortic arch great vessel origins. Narrowing of less than 50% is present at the proximal subclavian artery. No aneurysm is present. Pulmonary artery opacification is excellent. Focal filling defects are present to suggest pulmonary embolus. Pulmonary artery size is normal. Mediastinum/Nodes: No enlarged mediastinal, hilar, or axillary lymph nodes. Thyroid gland, trachea, and esophagus demonstrate no significant findings. Lungs/Pleura: Mild dependent atelectasis present bilaterally. Lungs are otherwise clear. No significant pleural effusion or pneumothorax is present. Upper Abdomen: Limited imaging the abdomen is unremarkable. There is no significant adenopathy. No solid organ lesions are present. Musculoskeletal: Are curvature is present in the thoracic spine. Vertebral body heights normal. No listhesis is present. Ribs are unremarkable. Review of the MIP images confirms the above findings. IMPRESSION: 1. No pulmonary embolus. 2. Extensive coronary artery disease. 3. Scoliosis of the thoracic spine. 4.  Aortic Atherosclerosis (ICD10-I70.0). Electronically Signed   By: Marin Roberts M.D.   On: 03/26/2023 16:09   DG Chest Port 1 View  Result Date: 03/26/2023 CLINICAL  DATA:  Hypoxia, tachycardia EXAM: PORTABLE CHEST 1 VIEW COMPARISON:  03/22/2023 FINDINGS: Unchanged cardiac and mediastinal contours. No focal pulmonary opacity. No pleural effusion or pneumothorax. No acute osseous abnormality. IMPRESSION: No acute cardiopulmonary process. Electronically Signed   By: Wiliam Ke M.D.   On: 03/26/2023 12:42    I independently reviewed the above imaging studies.  Impression/Recommendation Sepsis UTI Left ureteral calculus with obstruction Chronic foley catheter  The patient is presentation is consistent with sepsis likely secondary to a UTI in the setting of an obstructing ureteral calculus.  Due to his critical condition, management with a nephrostomy tube was recommended.  Unfortunately, he was unable to undergo this procedure due to hypotension and hypoxia.  He is currently on comfort measures only. I had a lengthy discussion with the family regarding the patient's current urologic condition. Questions answered. Will follow peripherally at this  time.    Di Kindle 03/26/2023, 10:07 PM

## 2023-03-26 NOTE — H&P (Signed)
NAME:  Trevor Phillips, MRN:  [id number], DOB:  18-Feb-1931, LOS: 0 ADMISSION DATE:  03/26/2023, CONSULTATION DATE: 5/17 REFERRING MD: Dr. Charm Barges EDP, CHIEF COMPLAINT: Lactic acid elevation  History of Present Illness:  89 year old male with past medical history as below, which is significant for neurogenic bladder following prostate cancer, chronic Foley, dementia, SNF resident.  He was recently seen in the [facility] emergency department on 5/13 after dislodging his chronic Foley catheter.  He was a difficult replacement and required urology to place a coud catheter over guidewire.  UA at that time was marginal and the patient was discharged to SNF.  He again presented to [facility] emergency department on 5/17 with complaints from SNF staff of altered mental status and hypoxia.  The patient was hypoxic with oxygen saturation is 86 to 87% for EMS.  Upon arrival to the emergency department patient oxygen saturations were 100% on room air.  Laboratory evaluation significant lactic acid 8.9, potassium 3.2, creatinine 1.9, BUN 29, BNP 755, and troponin 7051.  Cardiology was consulted by the emergency physician for elevated troponin.  PCCM was consulted for elevated lactic.   Pertinent  Medical History   has a past medical history of Anemia due to acute blood loss, Bladder neck contracture, Diverticulosis of colon, Foley catheter in place, Gross hematuria, History of cerebral infarction, History of colon polyps, History of diverticulitis, History of prostate cancer (2003), History of recurrent UTIs, History of urinary retention, Neurogenic bladder, Self-catheterizes urinary bladder, Urinary retention, and Wears glasses.   Significant Hospital Events: Including procedures, antibiotic start and stop dates in addition to other pertinent events   5/13 presented to ED after Foley dislodged.  Difficult replacement by urology. 5/17 presented to ED for hypoxia and altered mental status.  Admitted  Interim  History / Subjective:    Objective   Blood pressure (!) 99/53, pulse (!) 130, temperature 98.3 F (36.8 C), temperature source Oral, resp. rate (!) 40, SpO2 100 %.       No intake or output data in the 24 hours ending 03/26/23 1658 There were no vitals filed for this visit.  Examination: General: Frail elderly gentleman in mild distress HENT: Normocephalic, atraumatic, PERRL, no JVD..  Mucous membranes dry. Lungs: Clear bilateral breath sounds.  Satting 100% on room air. Cardiovascular: Tachycardic, regular, no MRG. Abdomen: Soft, nondistended, generalized tenderness Extremities: No acute deformity, no edema.  Tremor Neuro: Spontaneous awake, alert, disoriented, poor historian. GU: Chronic Foley draining cloudy straw colored urine.  Resolved Hospital Problem list     Assessment & Plan:   Septic shock: secondary to obstructive uropathy and likely pyelonephritis - Peripheral NE for MAP goal 65 - UA and culture pending - Blood cultures pending - CTX 2g daily (cefepime/vanco/flagyl given in ED) - Monitor urine output  Lactic acidosis: Etiology not entirely certain. He is complaining of abdominal tenderness so perhaps an intraabdominal issue like bowel ischemia. CXR negative.  - Trend lactic 8.9 >>> - s/p 3-4 L IVF - Hemodynamic support  Neurogenic bladder - Continue foley. Do not exchange. Will need urology to change over a wire if necessary. - Recent hematuria > take care with starting [facility]  Elevated troponin - Cardiology consulted - trend Trop 7050 >>> - Will start heparin infusion after nephrostomy tube placed.   Hypoxia: resolved upon my evaluation in ED. Sats 100% on room air. CXR negative. - PRN oxygen to keep sats 90% - No role for CTA - Pulmonary hygiene  AMS: baseline mental  status uncertain, but me may not be too far off.  - Supportive care  Global: Discussed with son Harvie Heck. Ok with IR procedure and central line if necessary. DNR/DNI  Best Practice (right  click and "Reselect all SmartList Selections" daily)   Per primary  Diet/type: NPO DVT prophylaxis: heparin drip GI prophylaxis: PPI Lines: NA Foley:  Yes, chronic. DO NOT REMOVE Code Status:  DNR/DNI Last date of multidisciplinary goals of care discussion: 5/17  Labs   CBC: Recent Labs  Lab 03/26/23 1200 03/26/23 1254 03/26/23 1256  WBC 22.9*  --   --   NEUTROABS 21.9*  --   --   HGB 10.3* 10.5* 10.2*  HCT 32.5* 31.0* 30.0*  MCV 96.2  --   --   PLT 309  --   --      Basic Metabolic Panel: Recent Labs  Lab 03/26/23 1200 03/26/23 1254 03/26/23 1256  NA 138 140 141  K 3.2* 3.3* 3.3*  CL 107  --  105  CO2 16*  --   --   GLUCOSE 157*  --  152*  BUN 29*  --  27*  CREATININE 1.90*  --  1.60*  CALCIUM 8.5*  --   --     GFR: Estimated Creatinine Clearance: 23 mL/min (A) (by C-G formula based on SCr of 1.6 mg/dL (H)). Recent Labs  Lab 03/26/23 1200 03/26/23 1400  WBC 22.9*  --   LATICACIDVEN 8.9* 7.8*     Liver Function Tests: Recent Labs  Lab 03/26/23 1200  AST 71*  ALT 48*  ALKPHOS 199*  BILITOT 1.3*  PROT 6.0*  ALBUMIN 2.5*    Recent Labs  Lab 03/26/23 1200  LIPASE 23    No results for input(s): "AMMONIA" in the last 168 hours.  ABG    Component Value Date/Time   HCO3 15.3 (L) 03/26/2023 1254   TCO2 17 (L) 03/26/2023 1256   ACIDBASEDEF 9.0 (H) 03/26/2023 1254   O2SAT 43 03/26/2023 1254     Coagulation Profile: Recent Labs  Lab 03/26/23 1200  INR 1.7*     Cardiac Enzymes: No results for input(s): "CKTOTAL", "CKMB", "CKMBINDEX", "TROPONINI" in the last 168 hours.  HbA1C: Hgb A1c MFr Bld  Date/Time Value Ref Range Status  11/24/2022 09:50 PM 5.2 4.8 - 5.6 % Final    Comment:    (NOTE) Pre diabetes:          5.7%-6.4%  Diabetes:              >6.4%  Glycemic control for   <7.0% adults with diabetes     CBG: No results for input(s): "GLUCAP" in the last 168 hours.  Review of Systems:   Patient is encephalopathic  and/or intubated; therefore, history has been obtained from chart review.    Past Medical History:  He,  has a past medical history of Anemia due to acute blood loss, Bladder neck contracture, Diverticulosis of colon, Foley catheter in place, Gross hematuria, History of cerebral infarction, History of colon polyps, History of diverticulitis, History of prostate cancer (2003), History of recurrent UTIs, History of urinary retention, Neurogenic bladder, Self-catheterizes urinary bladder, Urinary retention, and Wears glasses.   Surgical History:   Past Surgical History:  Procedure Laterality Date   CATARACT EXTRACTION W/ INTRAOCULAR LENS  IMPLANT, BILATERAL  2010   CYSTO/  DILATATION BLADDER NECK CONTRACTURE  06/02/2002   CYSTOSCOPY W/ RETROGRADES  07/21/2016   Procedure: CYSTOSCOPY WITH RETROGRADE PYELOGRAM, CLOT EVACUATION, BLADDER BIOPSY WITH FULGERATION;  Surgeon: Jerilee Field, MD;  Location: [facility];  Service: Urology;;  0.5 TO 2 CM    EYE SURGERY Right yrs ago   PROSTATE SURGERY  yrs ago   RADICAL RETROPUBIC PROSTATECTOMY /  REPAIR RECTAL PERFORATION  03-21-2002  dr Windy Fast davis   TRANSURETHRAL RESECTION OF PROSTATE  09/06/2001     Social History:   reports that he quit smoking about 65 years ago. His smoking use included cigarettes. He has a 8.00 pack-year smoking history. He quit smokeless tobacco use about 43 years ago.  His smokeless tobacco use included snuff. He reports current alcohol use. He reports that he does not use drugs.   Family History:  His family history is not on file.   Allergies No Known Allergies   Home Medications  Prior to Admission medications   Medication Sig Start Date End Date Taking? Authorizing Provider  acetaminophen (TYLENOL) 325 MG tablet Take 2 tablets (650 mg total) by mouth every 4 (four) hours as needed for mild pain (or temp > 37.5 C (99.5 F)). Patient taking differently: Take 650 mg by mouth every 4 (four) hours as  needed (pain, temperature > 99.44F). 12/01/22   Lonia Blood, MD  atorvastatin (LIPITOR) 40 MG tablet Take 1 tablet (40 mg total) by mouth daily. Patient taking differently: Take 40 mg by mouth at bedtime. 12/02/22   Lonia Blood, MD  bisacodyl (DULCOLAX) 10 MG suppository Place 10 mg rectally daily as needed (constipation not relieved by Milk of Magnesia).    [provider]  Cholecalciferol (VITAMIN D3) 50 MCG (2000 UT) CAPS Take 2,000 Units by mouth daily.    [provider]  donepezil (ARICEPT) 10 MG tablet Take 10 mg by mouth at bedtime.    [provider]  ezetimibe (ZETIA) 10 MG tablet Take 10 mg by mouth every evening.     [provider]  ferrous sulfate 325 (65 FE) MG EC tablet Take 325 mg by mouth daily.    [provider]  Magnesium Hydroxide (MILK OF MAGNESIA PO) Take 30 mLs by mouth daily as needed (constipation).    [provider]  melatonin 5 MG TABS Take 5 mg by mouth at bedtime.    [provider]  methenamine (HIPREX) 1 G tablet Take 0.5 g by mouth 2 (two) times daily.    [provider]  Nutritional Supplements (NUTRITIONAL DRINK) LIQD Take 120 mLs by mouth 2 (two) times daily. Med Pass    [provider]  Sodium Phosphates (RA SALINE ENEMA RE) Place 1 enema rectally daily as needed (constipation not relieved by bisacodyl suppository.).    [provider]     Critical care time: 48 minutes      Joneen Roach, AGACNP-BC [facility]  See Amion for personal pager PCCM on call pager [phone] until 7pm. Please call Elink 7p-7a. [phone]  03/26/2023 4:58 PM

## 2023-03-26 NOTE — Consult Note (Signed)
CARDIOLOGY CONSULT NOTE  Patient ID: Trevor Phillips MRN: [id number] DOB/AGE: September 04, 1931 89 y.o.  Admit date: 03/26/2023 Referring Physician: [facility] Reason for Consultation:  Elevated troponin  HPI:   89 y.o.  Caucasian male with dementia, nursing home resident, DNR, indwelling Foley catheter for urinary retention, suspect urethral stricture.  Patient is a poor historian.  History is obtained from prior providers, and chart review.  It appears the patient was in the ER 4 days ago after he reportedly pulled his Foley catheter traumatically.  Urology placed [facility] Foley catheter in the setting of suspected urethral stricture.  Today, patient was brought back to [facility], ER due to hypoxia with oxygen saturations in mid 80s.  He was also found to be tachycardic.  White count is elevated to >20 K, lactic acid elevated 8.5.  On my arrival, patient is not able to give any collateral response to any of my questions.  He is mildly hypotensive at 80/50 mmHg, and is tachypneic and tachycardic.  Past Medical History:  Diagnosis Date   Anemia due to acute blood loss    gross hematuria   Bladder neck contracture    chronic   Diverticulosis of colon    Foley catheter in place    placed 07-11-2016   Gross hematuria    History of cerebral infarction    per CT 05-18-2012  chronic lacunar infarcts in basal ganglia  (silent?,  pt denies history S&S)   History of colon polyps    2004   History of diverticulitis    2014   History of prostate cancer 2003   s/p  radical prostatectomy 03-21-2002--  no recurrence (last PSA 07/2016  0.01)   History of recurrent UTIs    History of urinary retention    Neurogenic bladder    since prostatectomy 2003   Self-catheterizes urinary bladder    SECONDARY TO NEUROGENIC BLADDER    Urinary retention    Wears glasses      Past Surgical History:  Procedure Laterality Date   CATARACT EXTRACTION W/ INTRAOCULAR LENS  IMPLANT, BILATERAL  2010   CYSTO/   DILATATION BLADDER NECK CONTRACTURE  06/02/2002   CYSTOSCOPY W/ RETROGRADES  07/21/2016   Procedure: CYSTOSCOPY WITH RETROGRADE PYELOGRAM, CLOT EVACUATION, BLADDER BIOPSY WITH FULGERATION;  Surgeon: Jerilee Field, MD;  Location: [facility];  Service: Urology;;  0.5 TO 2 CM    EYE SURGERY Right yrs ago   PROSTATE SURGERY  yrs ago   RADICAL RETROPUBIC PROSTATECTOMY /  REPAIR RECTAL PERFORATION  03-21-2002  dr Windy Fast davis   TRANSURETHRAL RESECTION OF PROSTATE  09/06/2001     History reviewed. No pertinent family history.   Social History: Social History   Socioeconomic History   Marital status: Married    Spouse name: Not on file   Number of children: Not on file   Years of education: Not on file   Highest education level: Not on file  Occupational History   Not on file  Tobacco Use   Smoking status: Former    Packs/day: 1.00    Years: 8.00    Additional pack years: 0.00    Total pack years: 8.00    Types: Cigarettes    Quit date: 07/16/1957    Years since quitting: 65.7   Smokeless tobacco: Former    Types: Snuff    Quit date: 07/17/1979  Substance and Sexual Activity   Alcohol use: Yes    Comment: "very little"   Drug  use: No   Sexual activity: Not on file  Other Topics Concern   Not on file  Social History Narrative   Not on file   Social Determinants of Health   Financial Resource Strain: Not on file  Food Insecurity: No Food Insecurity (11/26/2022)   Hunger Vital Sign    Worried About Running Out of Food in the Last Year: Never true    Ran Out of Food in the Last Year: Never true  Transportation Needs: No Transportation Needs (11/26/2022)   PRAPARE - Administrator, Civil Service (Medical): No    Lack of Transportation (Non-Medical): No  Physical Activity: Not on file  Stress: Not on file  Social Connections: Not on file  Intimate Partner Violence: Not At Risk (11/26/2022)   Humiliation, Afraid, Rape, and Kick questionnaire    Fear  of Current or Ex-Partner: No    Emotionally Abused: No    Physically Abused: No    Sexually Abused: No     (Not in a hospital admission)   Review of Systems  Unable to perform ROS: Dementia      Physical Exam: Physical Exam Vitals and nursing note reviewed.  Constitutional:      General: He is not in acute distress.    Appearance: He is ill-appearing.     Interventions: He is not intubated. Neck:     Vascular: No JVD.  Cardiovascular:     Rate and Rhythm: Regular rhythm. Tachycardia present.     Heart sounds: Normal heart sounds. No murmur heard. Pulmonary:     Effort: Tachypnea and accessory muscle usage present. He is not intubated.     Breath sounds: Normal breath sounds. No decreased breath sounds, wheezing or rales.  Musculoskeletal:     Right lower leg: No edema.     Left lower leg: No edema.        Imaging/tests reviewed and independently interpreted: Lab Results: CBC, BMP, trop HS, BNP  Cardiac Studies:  Telemetry 03/26/2023: Sinus tachycardia  EKG 03/26/2023: Sinus tachycardia 132 bpm Inferior infarct, age indeterminate Diffuse ST depression, likely rate related  Echocardiogram: Pending   Assessment & Recommendations:  89 y.o.  Caucasian male with dementia, nursing home resident, DNR, indwelling Foley catheter for urinary retention, suspect urethral stricture.  Cardiology consulted for troponin elevation  Troponin elevation: High sensitive troponin >7000, with mildly elevated BNP.  EKG with sinus tachycardia and age-indeterminate inferior infarct.  With WBC, >20 K, lactic acid at 8.5, sepsis is a possibility.  Source remains unclear.  It also cannot be ruled out that he indeed had an acute myocardial infarction couple of days back and is now presenting with heart failure.  However, there is no clear chest pain with, although history is nearly impossible with his underlying dementia and possibly worsening mental status in the setting of developing  circulatory shock.  With his baseline poor functional status, and lack of clarity over definitive diagnosis, I do not recommend invasive cardiac workup.  Medicine or critical care team will admit the patient.  I will remain available in conservative role.  As part of continued medical management, heparin is reasonable with risks to be weighed with any ongoing bleeding especially with regards to his recent traumatic Foley catheter removal.  Overall, prognosis remains guarded.  It is perfectly reasonable to consider no escalation of medical care.  Discussed interpretation of tests and management recommendations with the primary team     Elder Negus, MD Pager: [phone] Office:  [phone]  

## 2023-03-26 NOTE — ED Provider Notes (Addendum)
[place] EMERGENCY DEPARTMENT AT [facility] Provider Note   CSN: [id number] Arrival date & time: 03/26/23  1144     History  Chief Complaint  Patient presents with   Shortness of Breath    Trevor Phillips is a 89 y.o. male.  Level 5 caveat secondary to dementia.  Patient was sent in from his facility for possible hypoxia elevated respiratory rate.  Patient does endorse cough and shortness of breath.  Denies any abdominal pain.  Fairly poor historian.  Of note he was recently in the ED 4 days ago after having pulled out his Foley catheter.  Required urology intervention to replace the catheter.  He has a signed DNR form with him.   The history is provided by the patient and the EMS personnel.  Shortness of Breath Severity:  Moderate Progression:  Unchanged Chronicity:  New Associated symptoms: cough   Associated symptoms: no chest pain        Home Medications Prior to Admission medications   Medication Sig Start Date End Date Taking? Authorizing Provider  acetaminophen (TYLENOL) 325 MG tablet Take 2 tablets (650 mg total) by mouth every 4 (four) hours as needed for mild pain (or temp > 37.5 C (99.5 F)). Patient taking differently: Take 650 mg by mouth every 4 (four) hours as needed (pain, temperature > 99.36F). 12/01/22   Lonia Blood, MD  atorvastatin (LIPITOR) 40 MG tablet Take 1 tablet (40 mg total) by mouth daily. Patient taking differently: Take 40 mg by mouth at bedtime. 12/02/22   Lonia Blood, MD  bisacodyl (DULCOLAX) 10 MG suppository Place 10 mg rectally daily as needed (constipation not relieved by Milk of Magnesia).    [provider]  Cholecalciferol (VITAMIN D3) 50 MCG (2000 UT) CAPS Take 2,000 Units by mouth daily.    [provider]  donepezil (ARICEPT) 10 MG tablet Take 10 mg by mouth at bedtime.    [provider]  ezetimibe (ZETIA) 10 MG tablet Take 10 mg by mouth every evening.     [provider]   ferrous sulfate 325 (65 FE) MG EC tablet Take 325 mg by mouth daily.    [provider]  Magnesium Hydroxide (MILK OF MAGNESIA PO) Take 30 mLs by mouth daily as needed (constipation).    [provider]  melatonin 5 MG TABS Take 5 mg by mouth at bedtime.    [provider]  methenamine (HIPREX) 1 G tablet Take 0.5 g by mouth 2 (two) times daily.    [provider]  Nutritional Supplements (NUTRITIONAL DRINK) LIQD Take 120 mLs by mouth 2 (two) times daily. Med [employer]    [provider]  Sodium Phosphates (RA SALINE ENEMA RE) Place 1 enema rectally daily as needed (constipation not relieved by bisacodyl suppository.).    [provider]      Allergies    Patient has no known allergies.    Review of Systems   Review of Systems  Unable to perform ROS: Dementia  Respiratory:  Positive for cough and shortness of breath.   Cardiovascular:  Negative for chest pain.    Physical Exam Updated Vital Signs BP 101/63 (BP Location: Right Arm)   Pulse (!) 132   Temp 98.3 F (36.8 C) (Oral)   Resp (!) 36   SpO2 94%  Physical Exam Vitals and nursing note reviewed.  Constitutional:      General: He is not in acute distress.    Appearance:  He is well-developed.  HENT:     Head: Normocephalic and atraumatic.  Eyes:     Conjunctiva/sclera: Conjunctivae normal.  Cardiovascular:     Rate and Rhythm: Regular rhythm. Tachycardia present.     Heart sounds: No murmur heard. Pulmonary:     Effort: Tachypnea and accessory muscle usage present. No respiratory distress.     Breath sounds: Normal breath sounds.  Abdominal:     Palpations: Abdomen is soft.     Tenderness: There is no abdominal tenderness.  Genitourinary:    Comments: Foley catheter in place no gross blood Musculoskeletal:        General: No swelling. Normal range of motion.     Cervical back: Neck supple.     Comments: He has an ulceration on his right heel.  There is no  surrounding erythema no drainage.  Skin:    General: Skin is warm and dry.     Capillary Refill: Capillary refill takes less than 2 seconds.  Neurological:     General: No focal deficit present.     Mental Status: He is alert. He is disoriented.     Comments: Patient is following commands moving all extremities without any gross focal deficit.     ED Results / Procedures / Treatments   Labs (all labs ordered are listed, but only abnormal results are displayed) Labs Reviewed  LACTIC ACID, PLASMA - Abnormal; Notable for the following components:      Result Value   Lactic Acid, Venous 8.9 (*)    All other components within normal limits  LACTIC ACID, PLASMA - Abnormal; Notable for the following components:   Lactic Acid, Venous 7.8 (*)    All other components within normal limits  COMPREHENSIVE METABOLIC PANEL - Abnormal; Notable for the following components:   Potassium 3.2 (*)    CO2 16 (*)    Glucose, Bld 157 (*)    BUN 29 (*)    Creatinine, Ser 1.90 (*)    Calcium 8.5 (*)    Total Protein 6.0 (*)    Albumin 2.5 (*)    AST 71 (*)    ALT 48 (*)    Alkaline Phosphatase 199 (*)    Total Bilirubin 1.3 (*)    GFR, Estimated 33 (*)    All other components within normal limits  CBC WITH DIFFERENTIAL/PLATELET - Abnormal; Notable for the following components:   WBC 22.9 (*)    RBC 3.38 (*)    Hemoglobin 10.3 (*)    HCT 32.5 (*)    Neutro Abs 21.9 (*)    Lymphs Abs 0.5 (*)    Abs Immature Granulocytes 0.35 (*)    All other components within normal limits  PROTIME-INR - Abnormal; Notable for the following components:   Prothrombin Time 20.3 (*)    INR 1.7 (*)    All other components within normal limits  APTT - Abnormal; Notable for the following components:   aPTT 41 (*)    All other components within normal limits  URINALYSIS, ROUTINE W REFLEX MICROSCOPIC - Abnormal; Notable for the following components:   Color, Urine AMBER (*)    APPearance CLOUDY (*)    Hgb urine  dipstick MODERATE (*)    Protein, ur 100 (*)    Leukocytes,Ua LARGE (*)    Bacteria, UA MANY (*)    All other components within normal limits  BRAIN NATRIURETIC PEPTIDE - Abnormal; Notable for the following components:   B Natriuretic Peptide 755.6 (*)  All other components within normal limits  I-STAT VENOUS BLOOD GAS, ED - Abnormal; Notable for the following components:   pCO2, Ven 28.3 (*)    pO2, Ven 25 (*)    Bicarbonate 15.3 (*)    TCO2 16 (*)    Acid-base deficit 9.0 (*)    Potassium 3.3 (*)    HCT 31.0 (*)    Hemoglobin 10.5 (*)    All other components within normal limits  I-STAT CHEM 8, ED - Abnormal; Notable for the following components:   Potassium 3.3 (*)    BUN 27 (*)    Creatinine, Ser 1.60 (*)    Glucose, Bld 152 (*)    TCO2 17 (*)    Hemoglobin 10.2 (*)    HCT 30.0 (*)    All other components within normal limits  TROPONIN I (HIGH SENSITIVITY) - Abnormal; Notable for the following components:   Troponin I (High Sensitivity) 7,051 (*)    All other components within normal limits  TROPONIN I (HIGH SENSITIVITY) - Abnormal; Notable for the following components:   Troponin I (High Sensitivity) 8,342 (*)    All other components within normal limits  SARS CORONAVIRUS 2 BY RT PCR  CULTURE, BLOOD (ROUTINE X 2)  CULTURE, BLOOD (ROUTINE X 2)  URINE CULTURE  LIPASE, BLOOD  CBC  BASIC METABOLIC PANEL  MAGNESIUM  PHOSPHORUS  LACTIC ACID, PLASMA  LACTIC ACID, PLASMA    EKG EKG Interpretation  Date/Time:  Friday Mar 26 2023 11:56:29 EDT Ventricular Rate:  132 PR Interval:  203 QRS Duration: 83 QT Interval:  295 QTC Calculation: 438 R Axis:   75 Text Interpretation: Sinus or ectopic atrial tachycardia Borderline prolonged PR interval Repolarization abnormality, prob rate related increased rate and nonspecific ST,Ts comapred with prior 1/24 Confirmed by Meridee Score [phone]) on 03/26/2023 12:05:51 PM  Radiology CT ABDOMEN PELVIS W CONTRAST  Result Date:  03/26/2023 CLINICAL DATA:  Sepsis. EXAM: CT ABDOMEN AND PELVIS WITH CONTRAST TECHNIQUE: Multidetector CT imaging of the abdomen and pelvis was performed using the standard protocol following bolus administration of intravenous contrast. RADIATION DOSE REDUCTION: This exam was performed according to the departmental dose-optimization program which includes automated exposure control, adjustment of the mA and/or kV according to patient size and/or use of iterative reconstruction technique. CONTRAST:  60mL OMNIPAQUE IOHEXOL 350 MG/ML SOLN COMPARISON:  CT of the abdomen and pelvis without contrast 3/13/4 FINDINGS: Lower chest: Mild atelectasis is present at the lung bases, left greater than right. Heart size is normal. Coronary artery calcifications are present. Hepatobiliary: No focal liver abnormality is seen. No gallstones, gallbladder wall thickening, or biliary dilatation. Pancreas: Unremarkable. No pancreatic ductal dilatation or surrounding inflammatory changes. Spleen: Normal in size without focal abnormality. Adrenals/Urinary Tract: Adrenal glands are normal bilaterally. Mild left-sided hydronephrosis is again noted. The slightly delayed nephrogram is present on the left. The previously seen proximal left ureteral stone has migrated to the distal ureter. 5 mm obstructing stone is present with mild dilation inflammatory changes of the ureter proximal to the level. The right ureter is within normal limits. The urinary bladder is collapsed with a Foley catheter in place. Stomach/Bowel: The stomach and duodenum are within normal limits. The small bowel is unremarkable. The terminal ileum is within normal limits. The ascending and transverse colon are normal. Diverticular changes are present in the descending and sigmoid colon without focal inflammation to suggest diverticulitis. Vascular/Lymphatic: Extensive atherosclerotic calcifications are again noted within the aorta branch vessels. No aneurysm is present.  Reproductive: Prostate is unremarkable. Other: Vertebral body heights and alignment are normal. Degenerative facet changes are greatest at L3-4. No focal osseous lesions are present. The bony pelvis is within normal limits. Degenerative changes are noted in the hips bilaterally. Musculoskeletal: Vertebral body heights and alignment are maintained. Slight leftward curvature is present in the lumbar spine. The hips are located bilaterally. Moderate degenerative changes are present both hips. No focal osseous lesions are present. IMPRESSION: 1. Obstructing 5 mm stone in the distal left ureter with mild left-sided hydronephrosis. 2. Slightly delayed nephrogram on the left. 3. Descending and sigmoid diverticulosis without diverticulitis. 4. Coronary artery disease. 5. Moderate degenerative changes in the hips bilaterally. 6.  Aortic Atherosclerosis (ICD10-I70.0). Electronically Signed   By: Marin Roberts M.D.   On: 03/26/2023 16:22   CT Angio Chest PE W/Cm &/Or Wo Cm  Result Date: 03/26/2023 CLINICAL DATA:  Pulmonary embolism suspected, high probability. Proxy a. Tachycardia. EXAM: CT ANGIOGRAPHY CHEST WITH CONTRAST TECHNIQUE: Multidetector CT imaging of the chest was performed using the standard protocol during bolus administration of intravenous contrast. Multiplanar CT image reconstructions and MIPs were obtained to evaluate the vascular anatomy. RADIATION DOSE REDUCTION: This exam was performed according to the departmental dose-optimization program which includes automated exposure control, adjustment of the mA and/or kV according to patient size and/or use of iterative reconstruction technique. CONTRAST:  60mL OMNIPAQUE IOHEXOL 350 MG/ML SOLN COMPARISON:  Chronic view chest x-ray 03/26/2023 FINDINGS: Cardiovascular: The heart size is normal. Extensive coronary artery calcifications are present. Atherosclerotic calcifications are present the aortic arch great vessel origins. Narrowing of less than 50% is  present at the proximal subclavian artery. No aneurysm is present. Pulmonary artery opacification is excellent. Focal filling defects are present to suggest pulmonary embolus. Pulmonary artery size is normal. Mediastinum/Nodes: No enlarged mediastinal, hilar, or axillary lymph nodes. Thyroid gland, trachea, and esophagus demonstrate no significant findings. Lungs/Pleura: Mild dependent atelectasis present bilaterally. Lungs are otherwise clear. No significant pleural effusion or pneumothorax is present. Upper Abdomen: Limited imaging the abdomen is unremarkable. There is no significant adenopathy. No solid organ lesions are present. Musculoskeletal: Are curvature is present in the thoracic spine. Vertebral body heights normal. No listhesis is present. Ribs are unremarkable. Review of the MIP images confirms the above findings. IMPRESSION: 1. No pulmonary embolus. 2. Extensive coronary artery disease. 3. Scoliosis of the thoracic spine. 4.  Aortic Atherosclerosis (ICD10-I70.0). Electronically Signed   By: Marin Roberts M.D.   On: 03/26/2023 16:09   DG Chest Port 1 View  Result Date: 03/26/2023 CLINICAL DATA:  Hypoxia, tachycardia EXAM: PORTABLE CHEST 1 VIEW COMPARISON:  03/22/2023 FINDINGS: Unchanged cardiac and mediastinal contours. No focal pulmonary opacity. No pleural effusion or pneumothorax. No acute osseous abnormality. IMPRESSION: No acute cardiopulmonary process. Electronically Signed   By: Wiliam Ke M.D.   On: 03/26/2023 12:42    Procedures .Critical Care  Performed by: Terrilee Files, MD Authorized by: Terrilee Files, MD   Critical care provider statement:    Critical care time (minutes):  80   Critical care time was exclusive of:  Separately billable procedures and treating other patients   Critical care was necessary to treat or prevent imminent or life-threatening deterioration of the following conditions:  Cardiac failure, circulatory failure and sepsis   Critical care  was time spent personally by me on the following activities:  Development of treatment plan with patient or surrogate, discussions with consultants, evaluation of patient's response to treatment, examination of patient, obtaining history  from patient or surrogate, ordering and performing treatments and interventions, ordering and review of laboratory studies, ordering and review of radiographic studies, pulse oximetry, re-evaluation of patient's condition and review of old charts   I assumed direction of critical care for this patient from another provider in my specialty: no       Medications Ordered in ED Medications  docusate sodium (COLACE) capsule 100 mg (has no administration in time range)  polyethylene glycol (MIRALAX / GLYCOLAX) packet 17 g (has no administration in time range)  lactated ringers infusion (has no administration in time range)  cefTRIAXone (ROCEPHIN) 2 g in sodium chloride 0.9 % 100 mL IVPB (has no administration in time range)  0.9 %  sodium chloride infusion (has no administration in time range)  norepinephrine (LEVOPHED) 4mg  in (0.016 mg/mL) premix infusion (3 mcg/min Intravenous Rate/Dose Change 03/26/23 1724)  iohexol (OMNIPAQUE) 300 MG/ML solution 50 mL (has no administration in time range)  lidocaine (XYLOCAINE) 1 % (with pres) injection 20 mL (has no administration in time range)  lactated ringers bolus 1,000 mL (0 mLs Intravenous Stopped 03/26/23 1357)  lactated ringers bolus 600 mL (0 mLs Intravenous Stopped 03/26/23 1523)  ceFEPIme (MAXIPIME) 2 g in sodium chloride 0.9 % 100 mL IVPB (0 g Intravenous Stopped 03/26/23 1459)  metroNIDAZOLE (FLAGYL) IVPB 500 mg (0 mg Intravenous Stopped 03/26/23 1551)  vancomycin (VANCOCIN) IVPB 1000 mg/200 mL premix (0 mg Intravenous Stopped 03/26/23 1551)  iohexol (OMNIPAQUE) 350 MG/ML injection 60 mL (60 mLs Intravenous Contrast Given 03/26/23 1545)  lactated ringers bolus 1,000 mL (1,000 mLs Intravenous New Bag/Given 03/26/23  1627)    ED Course/ Medical Decision Making/ A&P Clinical Course as of 03/26/23 1743  Fri Mar 26, 2023  1206 Patient's initial EKG showing tachycardia possibly sinus versus ectopic.  Also has inferior ST elevations although wavy baseline and elevated rate.  Patient denies any chest pain.  Will check troponins. [MB]  1254 chest x-ray interpreted by me as no acute infiltrate.  Awaiting radiology reading. [MB]  1442 Discussed with Delorise Shiner from critical care, team will be down to see patient. [MB]  1442 No family has been at the bedside. [MB]  1453 Discussed with Dr. Nathanial Millman from cardiology.  He reviewed patient's EKG and with his age and his labs he does not feel acute intervention would be warranted at this time.  He is recommending heparin. [MB]  1517 Patient was seen by critical care Dr. Merrily Pew.  He is not recommending going with heparin at this time.  He feels even with a low GFR he should get CT with contrast.  He does not feel the patient needs admission to the ICU at this time but recommends continue with aggressive hydration. [MB]  1624 CT angio chest not showing any significant PE.  Does have significant atherosclerotic disease. [MB]  1637 Discussed with Dr. [name] [facility].  He feels the patient is too sick for the operating room at this time.  Recommends admission and interventional radiology consult. [MB]  1647 Discussed with patient's son Kaidon Castellano who is on his way up here to the ED and will be here in about 30 minutes. [MB]  1650 Discussed with interventional radiology who will evaluate patient for possible nephrostomy tubes.  Care is being taken over by Dr. Merrily Pew critical care. [MB]    Clinical Course User Index [MB] Terrilee Files, MD  Medical Decision Making Amount and/or Complexity of Data Reviewed Labs: ordered. Radiology: ordered. ECG/medicine tests: ordered.  Risk Prescription drug management. Decision regarding  hospitalization.   This patient complains of short of breath; this involves an extensive number of treatment Options and is a complaint that carries with it a high risk of complications and morbidity. The differential includes pneumonia, pneumothorax, PE, sepsis, metabolic derangement, CHF, ACS, perforation  I ordered, reviewed and interpreted labs, which included CBC with markedly elevated white count stable low hemoglobin chemistries with AKI low bicarb elevated LFTs, lactate critically elevated, troponins elevated, VBG without significant acidosis, urinalysis with signs of infection, blood culture sent I ordered medication IV fluids IV antibiotics and reviewed PMP when indicated. I ordered imaging studies which included chest x-ray, CT angio chest, CT abdomen and pelvis and I independently    visualized and interpreted imaging which showed no PE.  Does have left ureteral stone Additional history obtained from EMS Previous records obtained and reviewed in epic including prior urology notes and ED notes I consulted urology Dr. [name], Dr. Merrily Pew critical care, Dr. Rosemary Holms cardiology and interventional radiology and discussed lab and imaging findings and discussed disposition.  Cardiac monitoring reviewed, sinus tachycardia Social determinants considered, no significant barriers Critical Interventions: Workup and initiation of septic protocol with aggressive IV fluids antibiotics.  Consultation of multiple services  After the interventions stated above, I reevaluated the patient and found patient to be critically ill.  He remains with soft blood pressures and tachypneic Admission and further testing considered, he will need admission to the hospital for further management.  Condition guarded.         Final Clinical Impression(s) / ED Diagnoses Final diagnoses:  Septic shock (HCC)  Left ureteral stone  NSTEMI (non-ST elevated myocardial infarction) [facility])    Rx / DC Orders ED  Discharge Orders     None         Terrilee Files, MD 03/26/23 1741    Terrilee Files, MD 03/26/23 1743

## 2023-03-26 NOTE — Sedation Documentation (Signed)
MD Provider at bedside unable to tolerate lateral or prone on bipap, moving patient to icu

## 2023-03-26 NOTE — ED Notes (Addendum)
Patient oxygen levels 50-60% RA, Patient placed on non-rebreather, respiratory called, ICU paged

## 2023-03-26 NOTE — ED Notes (Signed)
ICU and respiratory at bedside

## 2023-03-26 NOTE — Progress Notes (Signed)
Pt transported from ED room 7 to IR and then to 2M14, without complications. RN at bedside.

## 2023-03-26 NOTE — Progress Notes (Signed)
RT placed Pt on bipap per MD/order due to Pt being in respiratory distress and de-stating. Pt is tolerating it well at this time. RT will continue to monitor as needed.

## 2023-03-26 NOTE — Sepsis Progress Note (Signed)
Sepsis protocol monitored by eLink ?

## 2023-03-26 NOTE — Progress Notes (Signed)
[facility] Physician-Brief Progress Note Patient Name: Trevor Phillips DOB: 03-Mar-1931 MRN: [id number]   Date of Service  03/26/2023  HPI/Events of Note  Patient admitted with septic shock secondary to urinary tract infection associated with obstructive uropathy, patient has also developed acute hypoxemic respiratory failure at least in part due to aggressive fluid resuscitation for septic shock, he is on BIPAP. Other issue NSTEMI, most likely demand ischemia.  eICU Interventions  New Patient Evaluation.        Thomasene Lot Nixie Laube 03/26/2023, 7:16 PM

## 2023-03-26 NOTE — ED Notes (Signed)
Family at bedside. 

## 2023-03-27 DIAGNOSIS — Z66 Do not resuscitate: Secondary | ICD-10-CM | POA: Diagnosis present

## 2023-03-27 DIAGNOSIS — A419 Sepsis, unspecified organism: Secondary | ICD-10-CM | POA: Diagnosis not present

## 2023-03-27 DIAGNOSIS — R6521 Severe sepsis with septic shock: Secondary | ICD-10-CM | POA: Diagnosis not present

## 2023-03-27 DIAGNOSIS — Z515 Encounter for palliative care: Secondary | ICD-10-CM

## 2023-03-27 LAB — CULTURE, BLOOD (ROUTINE X 2): Special Requests: ADEQUATE

## 2023-03-27 LAB — BLOOD CULTURE ID PANEL (REFLEXED) - BCID2

## 2023-03-27 LAB — URINE CULTURE

## 2023-03-27 NOTE — Progress Notes (Signed)
PHARMACY - PHYSICIAN COMMUNICATION CRITICAL VALUE ALERT - BLOOD CULTURE IDENTIFICATION (BCID)  Trevor Phillips is an 89 y.o. male who presented to [facility] on 03/26/2023 with a chief complaint of sepsis  Assessment:  Pt with 4/4 bottles growing Ecoli bacteremia, no resistance  Name of physician (or Provider) Contacted: Dr. Warrick Parisian  Current antibiotics: Rocephin 2gm IV q24h  Changes to prescribed antibiotics recommended:  Patient is on recommended antibiotics - No changes needed  Results for orders placed or performed during the hospital encounter of 03/26/23  Blood Culture ID Panel (Reflexed) (Collected: 03/26/2023 12:00 PM)  Result Value Ref Range   Enterococcus faecalis NOT DETECTED NOT DETECTED   Enterococcus Faecium NOT DETECTED NOT DETECTED   Listeria monocytogenes NOT DETECTED NOT DETECTED   Staphylococcus species NOT DETECTED NOT DETECTED   Staphylococcus aureus (BCID) NOT DETECTED NOT DETECTED   Staphylococcus epidermidis NOT DETECTED NOT DETECTED   Staphylococcus lugdunensis NOT DETECTED NOT DETECTED   Streptococcus species NOT DETECTED NOT DETECTED   Streptococcus agalactiae NOT DETECTED NOT DETECTED   Streptococcus pneumoniae NOT DETECTED NOT DETECTED   Streptococcus pyogenes NOT DETECTED NOT DETECTED   A.calcoaceticus-baumannii NOT DETECTED NOT DETECTED   Bacteroides fragilis NOT DETECTED NOT DETECTED   Enterobacterales DETECTED (A) NOT DETECTED   Enterobacter cloacae complex NOT DETECTED NOT DETECTED   Escherichia coli DETECTED (A) NOT DETECTED   Klebsiella aerogenes NOT DETECTED NOT DETECTED   Klebsiella oxytoca NOT DETECTED NOT DETECTED   Klebsiella pneumoniae NOT DETECTED NOT DETECTED   Proteus species NOT DETECTED NOT DETECTED   Salmonella species NOT DETECTED NOT DETECTED   Serratia marcescens NOT DETECTED NOT DETECTED   Haemophilus influenzae NOT DETECTED NOT DETECTED   Neisseria meningitidis NOT DETECTED NOT DETECTED   Pseudomonas aeruginosa NOT DETECTED NOT  DETECTED   Stenotrophomonas maltophilia NOT DETECTED NOT DETECTED   Candida albicans NOT DETECTED NOT DETECTED   Candida auris NOT DETECTED NOT DETECTED   Candida glabrata NOT DETECTED NOT DETECTED   Candida krusei NOT DETECTED NOT DETECTED   Candida parapsilosis NOT DETECTED NOT DETECTED   Candida tropicalis NOT DETECTED NOT DETECTED   Cryptococcus neoformans/gattii NOT DETECTED NOT DETECTED   CTX-M ESBL NOT DETECTED NOT DETECTED   Carbapenem resistance IMP NOT DETECTED NOT DETECTED   Carbapenem resistance KPC NOT DETECTED NOT DETECTED   Carbapenem resistance NDM NOT DETECTED NOT DETECTED   Carbapenem resist OXA 48 LIKE NOT DETECTED NOT DETECTED   Carbapenem resistance VIM NOT DETECTED NOT DETECTED    Christoper Fabian, PharmD, BCPS Please see amion for complete clinical pharmacist phone list 03/27/2023  12:25 AM

## 2023-03-27 NOTE — Progress Notes (Signed)
Reviewed the chart. Comfort care measures noted. Cardiology will sign off.   Trevor Negus, MD Pager: [phone] Office: [phone]

## 2023-03-27 NOTE — H&P (Signed)
NAME:  Trevor Phillips, MRN:  [id number], DOB:  11-23-1930, LOS: 1 ADMISSION DATE:  03/26/2023, CONSULTATION DATE: 5/17 REFERRING MD: Dr. Charm Barges EDP, CHIEF COMPLAINT: Lactic acid elevation  History of Present Illness:  89 year old male with past medical history as below, which is significant for neurogenic bladder following prostate cancer, chronic Foley, dementia, SNF resident.  He was recently seen in the [facility] emergency department on 5/13 after dislodging his chronic Foley catheter.  He was a difficult replacement and required urology to place a coud catheter over guidewire.  UA at that time was marginal and the patient was discharged to SNF.  He again presented to [facility] emergency department on 5/17 with complaints from SNF staff of altered mental status and hypoxia.  The patient was hypoxic with oxygen saturation is 86 to 87% for EMS.  Upon arrival to the emergency department patient oxygen saturations were 100% on room air.  Laboratory evaluation significant lactic acid 8.9, potassium 3.2, creatinine 1.9, BUN 29, BNP 755, and troponin 7051.  Cardiology was consulted by the emergency physician for elevated troponin.  PCCM was consulted for elevated lactic.   Pertinent  Medical History   has a past medical history of Anemia due to acute blood loss, Bladder neck contracture, Diverticulosis of colon, Foley catheter in place, Gross hematuria, History of cerebral infarction, History of colon polyps, History of diverticulitis, History of prostate cancer (2003), History of recurrent UTIs, History of urinary retention, Neurogenic bladder, Self-catheterizes urinary bladder, Urinary retention, and Wears glasses.   Significant Hospital Events: Including procedures, antibiotic start and stop dates in addition to other pertinent events   5/13 presented to ED after Foley dislodged.  Difficult replacement by urology. 5/17 presented to ED for hypoxia and altered mental status.  Admitted  Interim  History / Subjective:    5/18: 2L [place]. Not  on pressors. Pulse ox 98%. SBP 66. On morphine gtt. On comfort care afer becoming, "severely hypotensive and hypoxic while down on his right side for nephrostomy tube despite increasing doses of Levophed and 100% through BiPap and deemedis not stable enough to undergo this procedure. ".. PEr  UUrology Dr [name] the diagnosis is," sepsis likely secondary to a UTI in the setting of an obstructing ureteral calculus. "  Objective   Blood pressure (!) 65/46, pulse 91, temperature 97.8 F (36.6 C), temperature source Oral, resp. rate 10, height 5\' 5"  (1.651 m), weight 52 kg, SpO2 98 %.    FiO2 (%):  [100 %] 100 %   Intake/Output Summary (Last 24 hours) at 03/27/2023 0929 Last data filed at 03/27/2023 0800 Gross per 24 hour  Intake 431.5 ml  Output --  Net 431.5 ml   Filed Weights   03/27/23 0150  Weight: 52 kg    Examination: General: Frail elderly gentleman in mild distress HENT: Normocephalic, atraumatic, PERRL, no JVD..  Mucous membranes dry. Lungs: Clear bilateral breath sounds.  Satting 100% on room air. Cardiovascular: Tachycardic, regular, no MRG. Abdomen: Soft, nondistended, generalized tenderness Extremities: No acute deformity, no edema.  Tremor Neuro: Spontaneous awake, alert, disoriented, poor historian. GU: Chronic Foley draining cloudy straw colored urine.   General Appearance:  Looks moribund but comforabel Head:  Normocephalic, without obvious abnormality, atraumatic, FACIAL MUSCLES RELAXED Eyes:  PERRL - yes, conjunctiva/corneas - muddy     Ears:  Normal external ear canals, both ears Nose:  G tube - no but has [place] Throat:  ETT TUBE - no , OG tube - no Neck:  Supple,  No enlargement/tenderness/nodules Lungs: Clear to auscultation bilaterally, Heart:  S1 and S2 normal, no murmur, CVP - no.  Pressors - no Abdomen:  Soft, no masses, no organomegaly Genitalia / Rectal:  Not done Extremities:  Extremities- intact Skin:   ntact in exposed areas . Sacral area - not examined Neurologic:  Sedation - morphine gtt -> RASS - -5 .     Resolved Hospital Problem list     Assessment & Plan:   Septic shock: secondary to obstructive uropathy and likely pyelonephritis  5/18: Hypotensive. On comfort care. Pressors dcec  Plan - continue ceftriaxone to avoid fever (for now)  Lactic acidosis: froms epsis but bowel ischemia possible  Neurogenic bladder - Continue foley.  Eleated trop at admission  Hypoxia at admission  Acute Metabolic Encephalopathy baseline mental status uncertain,  -enceplphaty/dementia -   5/18 - morphine gtt and comfrotable  Comfort care/Terminal care/DNR/DNR  5/18L: Looks comfortable. Hypotensive  Plan  - morphine gtt -suspect life expectancy hours to few days   Best Practice (right click and "Reselect all SmartList Selections" daily)   Per primary  Diet/type: NPO DVT prophylaxis: off GI prophylaxis: PPI Lines: NA Foley:  Yes, chronic. DO NOT REMOVE Code Status:  DNR/DNI Last date of multidisciplinary goals of care discussion:   5/17: IPAL by CCM - comfort care 5/18: Called son Haldon Barlet:: son feels patient is comfortable.Mariane Duval him about transfer  DISPO Move to palliative floor - unrestricted vistiation   ATTESTATION & SIGNATURE     Dr. Kalman Shan, M.D., [facility].C.P Pulmonary and Critical Care Medicine Medical Director - [facility] ICU Staff Physician, [facility] [place] [facility] Critical [facility] Pager: [phone], If no answer or between  15:00h - 7:00h: call [phone]  03/27/2023 9:29 AM    LABS    PULMONARY Recent Labs  Lab 03/26/23 1254 03/26/23 1256  HCO3 15.3*  --   TCO2 16* 17*  O2SAT 43  --     CBC Recent Labs  Lab 03/26/23 1200 03/26/23 1254 03/26/23 1256  HGB 10.3* 10.5* 10.2*  HCT 32.5* 31.0* 30.0*  WBC 22.9*  --   --   PLT 309  --   --     COAGULATION Recent Labs  Lab 03/26/23 1200   INR 1.7*    CARDIAC  No results for input(s): "TROPONINI" in the last 168 hours. No results for input(s): "PROBNP" in the last 168 hours.   CHEMISTRY Recent Labs  Lab 03/26/23 1200 03/26/23 1254 03/26/23 1256  NA 138 140 141  K 3.2* 3.3* 3.3*  CL 107  --  105  CO2 16*  --   --   GLUCOSE 157*  --  152*  BUN 29*  --  27*  CREATININE 1.90*  --  1.60*  CALCIUM 8.5*  --   --    Estimated Creatinine Clearance: 23 mL/min (A) (by C-G formula based on SCr of 1.6 mg/dL (H)).   LIVER Recent Labs  Lab 03/26/23 1200  AST 71*  ALT 48*  ALKPHOS 199*  BILITOT 1.3*  PROT 6.0*  ALBUMIN 2.5*  INR 1.7*     INFECTIOUS Recent Labs  Lab 03/26/23 1200 03/26/23 1400  LATICACIDVEN 8.9* 7.8*     ENDOCRINE CBG (last 3)  No results for input(s): "GLUCAP" in the last 72 hours.       IMAGING x48h  - image(s) personally visualized  -   highlighted in bold CT ABDOMEN PELVIS  W CONTRAST  Result Date: 03/26/2023 CLINICAL DATA:  Sepsis. EXAM: CT ABDOMEN AND PELVIS WITH CONTRAST TECHNIQUE: Multidetector CT imaging of the abdomen and pelvis was performed using the standard protocol following bolus administration of intravenous contrast. RADIATION DOSE REDUCTION: This exam was performed according to the departmental dose-optimization program which includes automated exposure control, adjustment of the mA and/or kV according to patient size and/or use of iterative reconstruction technique. CONTRAST:  60mL OMNIPAQUE IOHEXOL 350 MG/ML SOLN COMPARISON:  CT of the abdomen and pelvis without contrast 3/13/4 FINDINGS: Lower chest: Mild atelectasis is present at the lung bases, left greater than right. Heart size is normal. Coronary artery calcifications are present. Hepatobiliary: No focal liver abnormality is seen. No gallstones, gallbladder wall thickening, or biliary dilatation. Pancreas: Unremarkable. No pancreatic ductal dilatation or surrounding inflammatory changes. Spleen: Normal in size  without focal abnormality. Adrenals/Urinary Tract: Adrenal glands are normal bilaterally. Mild left-sided hydronephrosis is again noted. The slightly delayed nephrogram is present on the left. The previously seen proximal left ureteral stone has migrated to the distal ureter. 5 mm obstructing stone is present with mild dilation inflammatory changes of the ureter proximal to the level. The right ureter is within normal limits. The urinary bladder is collapsed with a Foley catheter in place. Stomach/Bowel: The stomach and duodenum are within normal limits. The small bowel is unremarkable. The terminal ileum is within normal limits. The ascending and transverse colon are normal. Diverticular changes are present in the descending and sigmoid colon without focal inflammation to suggest diverticulitis. Vascular/Lymphatic: Extensive atherosclerotic calcifications are again noted within the aorta branch vessels. No aneurysm is present. Reproductive: Prostate is unremarkable. Other: Vertebral body heights and alignment are normal. Degenerative facet changes are greatest at L3-4. No focal osseous lesions are present. The bony pelvis is within normal limits. Degenerative changes are noted in the hips bilaterally. Musculoskeletal: Vertebral body heights and alignment are maintained. Slight leftward curvature is present in the lumbar spine. The hips are located bilaterally. Moderate degenerative changes are present both hips. No focal osseous lesions are present. IMPRESSION: 1. Obstructing 5 mm stone in the distal left ureter with mild left-sided hydronephrosis. 2. Slightly delayed nephrogram on the left. 3. Descending and sigmoid diverticulosis without diverticulitis. 4. Coronary artery disease. 5. Moderate degenerative changes in the hips bilaterally. 6.  Aortic Atherosclerosis (ICD10-I70.0). Electronically Signed   By: Marin Roberts M.D.   On: 03/26/2023 16:22   CT Angio Chest PE W/Cm &/Or Wo Cm  Result Date:  03/26/2023 CLINICAL DATA:  Pulmonary embolism suspected, high probability. Proxy a. Tachycardia. EXAM: CT ANGIOGRAPHY CHEST WITH CONTRAST TECHNIQUE: Multidetector CT imaging of the chest was performed using the standard protocol during bolus administration of intravenous contrast. Multiplanar CT image reconstructions and MIPs were obtained to evaluate the vascular anatomy. RADIATION DOSE REDUCTION: This exam was performed according to the departmental dose-optimization program which includes automated exposure control, adjustment of the mA and/or kV according to patient size and/or use of iterative reconstruction technique. CONTRAST:  60mL OMNIPAQUE IOHEXOL 350 MG/ML SOLN COMPARISON:  Chronic view chest x-ray 03/26/2023 FINDINGS: Cardiovascular: The heart size is normal. Extensive coronary artery calcifications are present. Atherosclerotic calcifications are present the aortic arch great vessel origins. Narrowing of less than 50% is present at the proximal subclavian artery. No aneurysm is present. Pulmonary artery opacification is excellent. Focal filling defects are present to suggest pulmonary embolus. Pulmonary artery size is normal. Mediastinum/Nodes: No enlarged mediastinal, hilar, or axillary lymph nodes. Thyroid gland, trachea, and esophagus demonstrate  no significant findings. Lungs/Pleura: Mild dependent atelectasis present bilaterally. Lungs are otherwise clear. No significant pleural effusion or pneumothorax is present. Upper Abdomen: Limited imaging the abdomen is unremarkable. There is no significant adenopathy. No solid organ lesions are present. Musculoskeletal: Are curvature is present in the thoracic spine. Vertebral body heights normal. No listhesis is present. Ribs are unremarkable. Review of the MIP images confirms the above findings. IMPRESSION: 1. No pulmonary embolus. 2. Extensive coronary artery disease. 3. Scoliosis of the thoracic spine. 4.  Aortic Atherosclerosis (ICD10-I70.0).  Electronically Signed   By: Marin Roberts M.D.   On: 03/26/2023 16:09   DG Chest Port 1 View  Result Date: 03/26/2023 CLINICAL DATA:  Hypoxia, tachycardia EXAM: PORTABLE CHEST 1 VIEW COMPARISON:  03/22/2023 FINDINGS: Unchanged cardiac and mediastinal contours. No focal pulmonary opacity. No pleural effusion or pneumothorax. No acute osseous abnormality. IMPRESSION: No acute cardiopulmonary process. Electronically Signed   By: Wiliam Ke M.D.   On: 03/26/2023 12:42

## 2023-03-28 DIAGNOSIS — R6521 Severe sepsis with septic shock: Secondary | ICD-10-CM | POA: Diagnosis not present

## 2023-03-28 DIAGNOSIS — Z515 Encounter for palliative care: Secondary | ICD-10-CM

## 2023-03-28 DIAGNOSIS — A419 Sepsis, unspecified organism: Secondary | ICD-10-CM | POA: Diagnosis not present

## 2023-03-28 LAB — CULTURE, BLOOD (ROUTINE X 2)

## 2023-03-28 LAB — URINE CULTURE: Culture: 20000 — AB

## 2023-03-28 NOTE — Progress Notes (Addendum)
NAME:  Trevor Phillips, MRN:  [id number], DOB:  30-Jul-1931, LOS: 2 ADMISSION DATE:  03/26/2023, CONSULTATION DATE: 5/17 REFERRING MD: Dr. Charm Barges EDP, CHIEF COMPLAINT: Lactic acid elevation  History of Present Illness:  89 year old male with past medical history as below, which is significant for neurogenic bladder following prostate cancer, chronic Foley, dementia, SNF resident.  He was recently seen in the [facility] emergency department on 5/13 after dislodging his chronic Foley catheter.  He was a difficult replacement and required urology to place a coud catheter over guidewire.  UA at that time was marginal and the patient was discharged to SNF.  He again presented to [facility] emergency department on 5/17 with complaints from SNF staff of altered mental status and hypoxia.  The patient was hypoxic with oxygen saturation is 86 to 87% for EMS.  Upon arrival to the emergency department patient oxygen saturations were 100% on room air.  Laboratory evaluation significant lactic acid 8.9, potassium 3.2, creatinine 1.9, BUN 29, BNP 755, and troponin 7051.  Cardiology was consulted by the emergency physician for elevated troponin.  PCCM was consulted for elevated lactic.   Pertinent  Medical History   has a past medical history of Anemia due to acute blood loss, Bladder neck contracture, Diverticulosis of colon, Foley catheter in place, Gross hematuria, History of cerebral infarction, History of colon polyps, History of diverticulitis, History of prostate cancer (2003), History of recurrent UTIs, History of urinary retention, Neurogenic bladder, Self-catheterizes urinary bladder, Urinary retention, and Wears glasses.   Significant Hospital Events: Including procedures, antibiotic start and stop dates in addition to other pertinent events   5/13 presented to ED after Foley dislodged.  Difficult replacement by urology. 5/17 presented to ED for hypoxia and altered mental status.  Admitted  5/18: 2L  [place]. Not  on pressors. Pulse ox 98%. SBP 66. On morphine gtt. On comfort care afer becoming, "severely hypotensive and hypoxic while down on his right side for nephrostomy tube despite increasing doses of Levophed and 100% through BiPap and deemedis not stable enough to undergo this procedure. ".. PEr  UUrology Dr [name] the diagnosis is," sepsis likely secondary to a UTI in the setting of an obstructing ureteral calculus. "  Interim History / Subjective:   5/19  - now in terminal care and comfortable. On morphine gtt. In 2W floor.  ChargeRN reportre anpenic spells overnight.   Objective   Blood pressure (!) 81/52, pulse 83, temperature 97.9 F (36.6 C), temperature source Axillary, resp. rate (!) 4, height 5\' 5"  (1.651 m), weight 50.7 kg, SpO2 99 %.        Intake/Output Summary (Last 24 hours) at 03/28/2023 1311 Last data filed at 03/28/2023 0729 Gross per 24 hour  Intake 272.92 ml  Output 0 ml  Net 272.92 ml   Filed Weights   03/27/23 0150 03/28/23 0353  Weight: 52 kg 50.7 kg    Examination:  General: No distress. Loks very comfortable.  Neuro: COmatose. On morphine gtt  Resp:  Barrel Chest - no.  Wheeze - no, Crackles - no, No overt respiratory distress CVS: Normal heart sounds. Murmurs - no Ext: Stigmata of Connective Tissue Disease - no HEENT: Normal upper airway. PEERL +. No post nasal drip   Resolved Hospital Problem list     Assessment & Plan:   Septic shock: secondary to obstructive uropathy and likely pyelonephritis  5/18: Hypotensive. On comfort care. Pressors dcec  Plan - STOP ceftriaxone   Lactic acidosis: froms  epsis but bowel ischemia possible Neurogenic bladder - Continue foley.  Eleated trop at admission  Hypoxia at admission  Acute Metabolic Encephalopathy baseline mental status uncertain,  -enceplphaty/dementia -   5/18 and 5/19 - morphine gtt and comfrotable  Comfort care/Terminal care/DNR/DNR  5/18L: Looks comfortable.  Hypotensive  Plan  - morphine gtt -suspect life expectancy hours to few days   Best Practice (right click and "Reselect all SmartList Selections" daily)   Diet/type: NPO DVT prophylaxis: off GI prophylaxis: PPI Lines: NA Foley:  Yes, chronic. DO NOT REMOVE Code Status:  DNR/DNI + Comfort care Last date of multidisciplinary goals of care discussion:   5/17: IPAL by CCM - comfort care 5/18: Called son Billyjack Bushee:: son feels patient is comfortable.Mariane Duval him about transfer 5/19:  Son Maycon Grap: Son says mouth is dry and wants moisture and damp cloth on forehead -wrote care order  DISPO In 2w now:  unrestricted vistiation - son's concerns addressed -> [facility] primry 03/29/23 d/w Dr Pola Corn   ATTESTATION & SIGNATURE     Dr. Kalman Shan, M.D., [facility].C.P Pulmonary and Critical Care Medicine Medical Director - [facility] ICU Staff Physician, [place] [facility] [place] Pulmonary and Critical Care Pager: [phone], If no answer or between  15:00h - 7:00h: call [phone]  03/28/2023 1:11 PM    LABS    PULMONARY Recent Labs  Lab 03/26/23 1254 03/26/23 1256  HCO3 15.3*  --   TCO2 16* 17*  O2SAT 43  --     CBC Recent Labs  Lab 03/26/23 1200 03/26/23 1254 03/26/23 1256  HGB 10.3* 10.5* 10.2*  HCT 32.5* 31.0* 30.0*  WBC 22.9*  --   --   PLT 309  --   --     COAGULATION Recent Labs  Lab 03/26/23 1200  INR 1.7*    CARDIAC  No results for input(s): "TROPONINI" in the last 168 hours. No results for input(s): "PROBNP" in the last 168 hours.   CHEMISTRY Recent Labs  Lab 03/26/23 1200 03/26/23 1254 03/26/23 1256  NA 138 140 141  K 3.2* 3.3* 3.3*  CL 107  --  105  CO2 16*  --   --   GLUCOSE 157*  --  152*  BUN 29*  --  27*  CREATININE 1.90*  --  1.60*  CALCIUM 8.5*  --   --    Estimated Creatinine Clearance: 22.4 mL/min (A) (by C-G formula based on SCr of 1.6 mg/dL (H)).   LIVER Recent Labs  Lab 03/26/23 1200  AST 71*   ALT 48*  ALKPHOS 199*  BILITOT 1.3*  PROT 6.0*  ALBUMIN 2.5*  INR 1.7*     INFECTIOUS Recent Labs  Lab 03/26/23 1200 03/26/23 1400  LATICACIDVEN 8.9* 7.8*     ENDOCRINE CBG (last 3)  No results for input(s): "GLUCAP" in the last 72 hours.       IMAGING x48h  - image(s) personally visualized  -   highlighted in bold CT ABDOMEN PELVIS W CONTRAST  Result Date: 03/26/2023 CLINICAL DATA:  Sepsis. EXAM: CT ABDOMEN AND PELVIS WITH CONTRAST TECHNIQUE: Multidetector CT imaging of the abdomen and pelvis was performed using the standard protocol following bolus administration of intravenous contrast. RADIATION DOSE REDUCTION: This exam was performed according to the departmental dose-optimization program which includes automated exposure control, adjustment of the mA and/or kV according to patient size and/or use of iterative reconstruction technique. CONTRAST:  60mL OMNIPAQUE IOHEXOL 350 MG/ML SOLN  COMPARISON:  CT of the abdomen and pelvis without contrast 3/13/4 FINDINGS: Lower chest: Mild atelectasis is present at the lung bases, left greater than right. Heart size is normal. Coronary artery calcifications are present. Hepatobiliary: No focal liver abnormality is seen. No gallstones, gallbladder wall thickening, or biliary dilatation. Pancreas: Unremarkable. No pancreatic ductal dilatation or surrounding inflammatory changes. Spleen: Normal in size without focal abnormality. Adrenals/Urinary Tract: Adrenal glands are normal bilaterally. Mild left-sided hydronephrosis is again noted. The slightly delayed nephrogram is present on the left. The previously seen proximal left ureteral stone has migrated to the distal ureter. 5 mm obstructing stone is present with mild dilation inflammatory changes of the ureter proximal to the level. The right ureter is within normal limits. The urinary bladder is collapsed with a Foley catheter in place. Stomach/Bowel: The stomach and duodenum are within normal  limits. The small bowel is unremarkable. The terminal ileum is within normal limits. The ascending and transverse colon are normal. Diverticular changes are present in the descending and sigmoid colon without focal inflammation to suggest diverticulitis. Vascular/Lymphatic: Extensive atherosclerotic calcifications are again noted within the aorta branch vessels. No aneurysm is present. Reproductive: Prostate is unremarkable. Other: Vertebral body heights and alignment are normal. Degenerative facet changes are greatest at L3-4. No focal osseous lesions are present. The bony pelvis is within normal limits. Degenerative changes are noted in the hips bilaterally. Musculoskeletal: Vertebral body heights and alignment are maintained. Slight leftward curvature is present in the lumbar spine. The hips are located bilaterally. Moderate degenerative changes are present both hips. No focal osseous lesions are present. IMPRESSION: 1. Obstructing 5 mm stone in the distal left ureter with mild left-sided hydronephrosis. 2. Slightly delayed nephrogram on the left. 3. Descending and sigmoid diverticulosis without diverticulitis. 4. Coronary artery disease. 5. Moderate degenerative changes in the hips bilaterally. 6.  Aortic Atherosclerosis (ICD10-I70.0). Electronically Signed   By: Marin Roberts M.D.   On: 03/26/2023 16:22   CT Angio Chest PE W/Cm &/Or Wo Cm  Result Date: 03/26/2023 CLINICAL DATA:  Pulmonary embolism suspected, high probability. Proxy a. Tachycardia. EXAM: CT ANGIOGRAPHY CHEST WITH CONTRAST TECHNIQUE: Multidetector CT imaging of the chest was performed using the standard protocol during bolus administration of intravenous contrast. Multiplanar CT image reconstructions and MIPs were obtained to evaluate the vascular anatomy. RADIATION DOSE REDUCTION: This exam was performed according to the departmental dose-optimization program which includes automated exposure control, adjustment of the mA and/or kV  according to patient size and/or use of iterative reconstruction technique. CONTRAST:  60mL OMNIPAQUE IOHEXOL 350 MG/ML SOLN COMPARISON:  Chronic view chest x-ray 03/26/2023 FINDINGS: Cardiovascular: The heart size is normal. Extensive coronary artery calcifications are present. Atherosclerotic calcifications are present the aortic arch great vessel origins. Narrowing of less than 50% is present at the proximal subclavian artery. No aneurysm is present. Pulmonary artery opacification is excellent. Focal filling defects are present to suggest pulmonary embolus. Pulmonary artery size is normal. Mediastinum/Nodes: No enlarged mediastinal, hilar, or axillary lymph nodes. Thyroid gland, trachea, and esophagus demonstrate no significant findings. Lungs/Pleura: Mild dependent atelectasis present bilaterally. Lungs are otherwise clear. No significant pleural effusion or pneumothorax is present. Upper Abdomen: Limited imaging the abdomen is unremarkable. There is no significant adenopathy. No solid organ lesions are present. Musculoskeletal: Are curvature is present in the thoracic spine. Vertebral body heights normal. No listhesis is present. Ribs are unremarkable. Review of the MIP images confirms the above findings. IMPRESSION: 1. No pulmonary embolus. 2. Extensive coronary artery disease. 3.  Scoliosis of the thoracic spine. 4.  Aortic Atherosclerosis (ICD10-I70.0). Electronically Signed   By: Marin Roberts M.D.   On: 03/26/2023 16:09

## 2023-03-28 NOTE — Plan of Care (Signed)
  Problem: Coping: Goal: Level of anxiety will decrease Outcome: Progressing   Problem: Pain Managment: Goal: General experience of comfort will improve Outcome: Progressing   Problem: Safety: Goal: Ability to remain free from injury will improve Outcome: Progressing   Problem: Skin Integrity: Goal: Risk for impaired skin integrity will decrease Outcome: Progressing   

## 2023-03-29 DIAGNOSIS — I2489 Other forms of acute ischemic heart disease: Secondary | ICD-10-CM | POA: Insufficient documentation

## 2023-03-29 DIAGNOSIS — D649 Anemia, unspecified: Secondary | ICD-10-CM | POA: Insufficient documentation

## 2023-03-29 DIAGNOSIS — R6521 Severe sepsis with septic shock: Secondary | ICD-10-CM | POA: Diagnosis not present

## 2023-03-29 DIAGNOSIS — A419 Sepsis, unspecified organism: Secondary | ICD-10-CM | POA: Diagnosis not present

## 2023-03-29 NOTE — Plan of Care (Signed)

## 2023-03-29 NOTE — Progress Notes (Signed)
  Progress Note   Patient: Trevor Phillips UJW:[id number] DOB: [birth year] DOA: 03/26/2023     3 DOS: the patient was seen and examined on 03/29/2023        Brief hospital course: Trevor Phillips is an 89 y.o. M with dementia, indwelling foley, prosCA s/p radical prostatectomy, and hx CVA who presented from SNF decreased mentation and hypoxia.        Assessment and Plan: Principal Problem:   Septic shock due to UTI due to ureteral stone and indwelling foley Active Problems:   AKI (acute kidney injury) (HCC)   Hypokalemia   Chronic indwelling Foley catheter   Primary hypertension   History of cerebrovascular accident (CVA) due to ischemia   Dementia without behavioral disturbance (HCC)   Malnutrition of moderate degree   Left ureteral stone   Urinary tract infection associated with indwelling urethral catheter (HCC)   DNR (do not resuscitate)   Encounter for terminal care   Demand ischemia   Normocytic anemia     Patient initially presented to the ER a few days prior to this admission with traumatic dislodgment of foley, foley was replaced with Coude, discharged home.  Four days later at SNF noted to have decrased mentation, hypoxia to 86%, sent back to the ER.  In the ER, lactate 8.9 mmoL/L, found to have ureteral stone, taken to OR for nephrostomy tube but developed hypotension.  This hypotension proved refractory to pressors and so in discussion with family, the patient was transitioned to a morphine infusion and pressors were stopped and all efforts were redirected towards comfort.    - Anticipate in hospital death - Continue morphine infusion        Subjective: Non nursing concersn, no family concerns.     Physical Exam: BP (!) 105/58 (BP Location: Left Arm)   Pulse 83   Temp 97.7 F (36.5 C) (Oral)   Resp 10   Ht 5\' 5"  (1.651 m)   Wt 50.7 kg   SpO2 96%   BMI 18.60 kg/m   Frail elderly adult male, lying in bed, unresponsive Breaths slow and ragged, no  rales appreciated Tachycardic, systolic and diastolic murmur noted. Decreased muscle mass diffusely.    Family Communication: Son at bedside    Disposition: Status is: Inpatient Expect in hospital death.        Author: Alberteen Sam, MD 03/29/2023 5:35 PM  For on call review [email or web address].

## 2023-03-29 NOTE — Hospital Course (Signed)
Trevor Phillips is an 89 y.o. M with dementia, indwelling foley, prosCA s/p radical prostatectomy, and hx CVA who presented from SNF decreased mentation and hypoxia.   5/13: Traumatic dislodgment of foley, seen in ER, replaced with Coude, discharged 5/17: Noted at SNF to have decrased mentation, hypoxia to 86%, sent to ER, lactate 8.9 mmoL/L --> found to have ureteral stone, taken to OR for nephrostomy tube but developed refractory hypotension despite pressors and transitioned to comfort measures

## 2023-03-29 NOTE — Care Management Important Message (Signed)
Important Message  Patient Details  Name: Trevor Phillips MRN: [id number] Date of Birth: 1931/04/24   Medicare Important Message Given:  Yes     Dorena Bodo 03/29/2023, 2:28 PM

## 2023-03-30 DIAGNOSIS — R6521 Severe sepsis with septic shock: Secondary | ICD-10-CM | POA: Diagnosis not present

## 2023-03-30 DIAGNOSIS — A419 Sepsis, unspecified organism: Secondary | ICD-10-CM | POA: Diagnosis not present

## 2023-04-10 NOTE — Death Summary Note (Signed)
DEATH SUMMARY   Patient Details  Name: Trevor Phillips MRN: [id number] DOB: [birth year] WUJ:[id number], [name], MD Admission/Discharge Information   Admit Date:  15-Apr-2023  Date of Death: Date of Death: Apr 19, 2023  Time of Death: Time of Death: 1340  Length of Stay: 4   Principle Cause of death: Septic shock due to UTI due to ureteral stone and indwelling foley catheter  Hospital Diagnoses: Principal Problem:   Septic shock due to UTI due to ureteral stone and indwelling foley Active Problems:   AKI (acute kidney injury) (HCC)   Hypokalemia   Chronic indwelling Foley catheter   Primary hypertension   History of cerebrovascular accident (CVA) due to ischemia   Dementia without behavioral disturbance (HCC)   Malnutrition of moderate degree   Left ureteral stone   Urinary tract infection associated with indwelling urethral catheter (HCC)   DNR (do not resuscitate)   Encounter for terminal care   Demand ischemia   Normocytic anemia   Hospital Course: Mr. Asano is an 89 y.o. M with dementia, indwelling foley, prosCA s/p radical prostatectomy, and hx CVA who presented from SNF decreased mentation and hypoxia.   Patient initially presented to the ER a few days prior to this admission with traumatic dislodgment of foley, foley was replaced with Coude, discharged home.   Four days later at SNF noted to have decrased mentation, hypoxia to 86%, sent back to the ER.   In the ER, lactate 8.9 mmoL/L, found to have ureteral stone, taken to OR for nephrostomy tube but developed hypotension.   This hypotension proved refractory to pressors and so in discussion with family, the patient was transitioned to a morphine infusion and pressors were stopped and all efforts were redirected towards comfort.  He passed comfortably.            The results of significant diagnostics from this hospitalization (including imaging, microbiology, ancillary and laboratory) are listed below for  reference.   Significant Diagnostic Studies: CT ABDOMEN PELVIS W CONTRAST  Result Date: 04/15/2023 CLINICAL DATA:  Sepsis. EXAM: CT ABDOMEN AND PELVIS WITH CONTRAST TECHNIQUE: Multidetector CT imaging of the abdomen and pelvis was performed using the standard protocol following bolus administration of intravenous contrast. RADIATION DOSE REDUCTION: This exam was performed according to the departmental dose-optimization program which includes automated exposure control, adjustment of the mA and/or kV according to patient size and/or use of iterative reconstruction technique. CONTRAST:  60mL OMNIPAQUE IOHEXOL 350 MG/ML SOLN COMPARISON:  CT of the abdomen and pelvis without contrast 3/13/4 FINDINGS: Lower chest: Mild atelectasis is present at the lung bases, left greater than right. Heart size is normal. Coronary artery calcifications are present. Hepatobiliary: No focal liver abnormality is seen. No gallstones, gallbladder wall thickening, or biliary dilatation. Pancreas: Unremarkable. No pancreatic ductal dilatation or surrounding inflammatory changes. Spleen: Normal in size without focal abnormality. Adrenals/Urinary Tract: Adrenal glands are normal bilaterally. Mild left-sided hydronephrosis is again noted. The slightly delayed nephrogram is present on the left. The previously seen proximal left ureteral stone has migrated to the distal ureter. 5 mm obstructing stone is present with mild dilation inflammatory changes of the ureter proximal to the level. The right ureter is within normal limits. The urinary bladder is collapsed with a Foley catheter in place. Stomach/Bowel: The stomach and duodenum are within normal limits. The small bowel is unremarkable. The terminal ileum is within normal limits. The ascending and transverse colon are normal. Diverticular changes are present in the descending and sigmoid colon without  focal inflammation to suggest diverticulitis. Vascular/Lymphatic: Extensive atherosclerotic  calcifications are again noted within the aorta branch vessels. No aneurysm is present. Reproductive: Prostate is unremarkable. Other: Vertebral body heights and alignment are normal. Degenerative facet changes are greatest at L3-4. No focal osseous lesions are present. The bony pelvis is within normal limits. Degenerative changes are noted in the hips bilaterally. Musculoskeletal: Vertebral body heights and alignment are maintained. Slight leftward curvature is present in the lumbar spine. The hips are located bilaterally. Moderate degenerative changes are present both hips. No focal osseous lesions are present. IMPRESSION: 1. Obstructing 5 mm stone in the distal left ureter with mild left-sided hydronephrosis. 2. Slightly delayed nephrogram on the left. 3. Descending and sigmoid diverticulosis without diverticulitis. 4. Coronary artery disease. 5. Moderate degenerative changes in the hips bilaterally. 6.  Aortic Atherosclerosis (ICD10-I70.0). Electronically Signed   By: Marin Roberts M.D.   On: 03/26/2023 16:22   CT Angio Chest PE W/Cm &/Or Wo Cm  Result Date: 03/26/2023 CLINICAL DATA:  Pulmonary embolism suspected, high probability. Proxy a. Tachycardia. EXAM: CT ANGIOGRAPHY CHEST WITH CONTRAST TECHNIQUE: Multidetector CT imaging of the chest was performed using the standard protocol during bolus administration of intravenous contrast. Multiplanar CT image reconstructions and MIPs were obtained to evaluate the vascular anatomy. RADIATION DOSE REDUCTION: This exam was performed according to the departmental dose-optimization program which includes automated exposure control, adjustment of the mA and/or kV according to patient size and/or use of iterative reconstruction technique. CONTRAST:  60mL OMNIPAQUE IOHEXOL 350 MG/ML SOLN COMPARISON:  Chronic view chest x-ray 03/26/2023 FINDINGS: Cardiovascular: The heart size is normal. Extensive coronary artery calcifications are present. Atherosclerotic  calcifications are present the aortic arch great vessel origins. Narrowing of less than 50% is present at the proximal subclavian artery. No aneurysm is present. Pulmonary artery opacification is excellent. Focal filling defects are present to suggest pulmonary embolus. Pulmonary artery size is normal. Mediastinum/Nodes: No enlarged mediastinal, hilar, or axillary lymph nodes. Thyroid gland, trachea, and esophagus demonstrate no significant findings. Lungs/Pleura: Mild dependent atelectasis present bilaterally. Lungs are otherwise clear. No significant pleural effusion or pneumothorax is present. Upper Abdomen: Limited imaging the abdomen is unremarkable. There is no significant adenopathy. No solid organ lesions are present. Musculoskeletal: Are curvature is present in the thoracic spine. Vertebral body heights normal. No listhesis is present. Ribs are unremarkable. Review of the MIP images confirms the above findings. IMPRESSION: 1. No pulmonary embolus. 2. Extensive coronary artery disease. 3. Scoliosis of the thoracic spine. 4.  Aortic Atherosclerosis (ICD10-I70.0). Electronically Signed   By: Marin Roberts M.D.   On: 03/26/2023 16:09   DG Chest Port 1 View  Result Date: 03/26/2023 CLINICAL DATA:  Hypoxia, tachycardia EXAM: PORTABLE CHEST 1 VIEW COMPARISON:  03/22/2023 FINDINGS: Unchanged cardiac and mediastinal contours. No focal pulmonary opacity. No pleural effusion or pneumothorax. No acute osseous abnormality. IMPRESSION: No acute cardiopulmonary process. Electronically Signed   By: Wiliam Ke M.D.   On: 03/26/2023 12:42    Microbiology: Recent Results (from the past 240 hour(s))  Blood Culture (routine x 2)     Status: Abnormal   Collection Time: 03/26/23 12:00 PM   Specimen: BLOOD  Result Value Ref Range Status   Specimen Description BLOOD RIGHT ANTECUBITAL  Final   Special Requests   Final    BOTTLES DRAWN AEROBIC AND ANAEROBIC Blood Culture adequate volume   Culture  Setup Time    Final    GRAM NEGATIVE RODS IN BOTH AEROBIC AND ANAEROBIC BOTTLES CRITICAL  RESULT CALLED TO, READ BACK BY AND VERIFIED WITH: PHARMD C. AMEND 03/27/23  @ 0021 by AB Performed at [facility], [address] [place], [place] [address]    Culture ESCHERICHIA COLI (A)  Final   Report Status 03/28/2023 FINAL  Final   Organism ID, Bacteria ESCHERICHIA COLI  Final      Susceptibility   Escherichia coli - MIC*    AMPICILLIN >=32 RESISTANT Resistant     CEFEPIME <=0.12 SENSITIVE Sensitive     CEFTAZIDIME <=1 SENSITIVE Sensitive     CEFTRIAXONE <=0.25 SENSITIVE Sensitive     CIPROFLOXACIN >=4 RESISTANT Resistant     GENTAMICIN >=16 RESISTANT Resistant     IMIPENEM <=0.25 SENSITIVE Sensitive     TRIMETH/SULFA <=20 SENSITIVE Sensitive     AMPICILLIN/SULBACTAM >=32 RESISTANT Resistant     PIP/TAZO <=4 SENSITIVE Sensitive     * ESCHERICHIA COLI  Blood Culture ID Panel (Reflexed)     Status: Abnormal   Collection Time: 03/26/23 12:00 PM  Result Value Ref Range Status   Enterococcus faecalis NOT DETECTED NOT DETECTED Final   Enterococcus Faecium NOT DETECTED NOT DETECTED Final   Listeria monocytogenes NOT DETECTED NOT DETECTED Final   Staphylococcus species NOT DETECTED NOT DETECTED Final   Staphylococcus aureus (BCID) NOT DETECTED NOT DETECTED Final   Staphylococcus epidermidis NOT DETECTED NOT DETECTED Final   Staphylococcus lugdunensis NOT DETECTED NOT DETECTED Final   Streptococcus species NOT DETECTED NOT DETECTED Final   Streptococcus agalactiae NOT DETECTED NOT DETECTED Final   Streptococcus pneumoniae NOT DETECTED NOT DETECTED Final   Streptococcus pyogenes NOT DETECTED NOT DETECTED Final   A.calcoaceticus-baumannii NOT DETECTED NOT DETECTED Final   Bacteroides fragilis NOT DETECTED NOT DETECTED Final   Enterobacterales DETECTED (A) NOT DETECTED Final    Comment: Enterobacterales represent a large order of gram negative bacteria, not a single organism. CRITICAL RESULT CALLED  TO, READ BACK BY AND VERIFIED WITH: PHARMD C. AMEND 03/27/23  @ 0021 by AB    Enterobacter cloacae complex NOT DETECTED NOT DETECTED Final   Escherichia coli DETECTED (A) NOT DETECTED Final    Comment: CRITICAL RESULT CALLED TO, READ BACK BY AND VERIFIED WITH: PHARMD C. AMEND 03/27/23  @ 0021 by AB    Klebsiella aerogenes NOT DETECTED NOT DETECTED Final   Klebsiella oxytoca NOT DETECTED NOT DETECTED Final   Klebsiella pneumoniae NOT DETECTED NOT DETECTED Final   Proteus species NOT DETECTED NOT DETECTED Final   Salmonella species NOT DETECTED NOT DETECTED Final   Serratia marcescens NOT DETECTED NOT DETECTED Final   Haemophilus influenzae NOT DETECTED NOT DETECTED Final   Neisseria meningitidis NOT DETECTED NOT DETECTED Final   Pseudomonas aeruginosa NOT DETECTED NOT DETECTED Final   Stenotrophomonas maltophilia NOT DETECTED NOT DETECTED Final   Candida albicans NOT DETECTED NOT DETECTED Final   Candida auris NOT DETECTED NOT DETECTED Final   Candida glabrata NOT DETECTED NOT DETECTED Final   Candida krusei NOT DETECTED NOT DETECTED Final   Candida parapsilosis NOT DETECTED NOT DETECTED Final   Candida tropicalis NOT DETECTED NOT DETECTED Final   Cryptococcus neoformans/gattii NOT DETECTED NOT DETECTED Final   CTX-M ESBL NOT DETECTED NOT DETECTED Final   Carbapenem resistance IMP NOT DETECTED NOT DETECTED Final   Carbapenem resistance KPC NOT DETECTED NOT DETECTED Final   Carbapenem resistance NDM NOT DETECTED NOT DETECTED Final   Carbapenem resist OXA 48 LIKE NOT DETECTED NOT DETECTED Final   Carbapenem resistance VIM NOT DETECTED NOT DETECTED Final  Comment: Performed at [facility], [address] [place], [place] [address]  SARS Coronavirus 2 by RT PCR (hospital order, performed in [facility] hospital lab) *cepheid single result test*     Status: None   Collection Time: 03/26/23 12:01 PM   Specimen: Nasal Swab  Result Value Ref Range Status   SARS Coronavirus 2 by RT  PCR NEGATIVE NEGATIVE Final    Comment: Performed at [facility], [address], [place], [place] [address]  Blood Culture (routine x 2)     Status: Abnormal   Collection Time: 03/26/23 12:05 PM   Specimen: BLOOD  Result Value Ref Range Status   Specimen Description BLOOD LEFT ANTECUBITAL  Final   Special Requests   Final    BOTTLES DRAWN AEROBIC AND ANAEROBIC Blood Culture results may not be optimal due to an excessive volume of blood received in culture bottles   Culture  Setup Time   Final    GRAM NEGATIVE RODS IN BOTH AEROBIC AND ANAEROBIC BOTTLES CRITICAL VALUE NOTED.  VALUE IS CONSISTENT WITH PREVIOUSLY REPORTED AND CALLED VALUE.    Culture (A)  Final    ESCHERICHIA COLI SUSCEPTIBILITIES PERFORMED ON PREVIOUS CULTURE WITHIN THE LAST 5 DAYS. Performed at [facility], [address] [place], [place] [address]    Report Status 03/28/2023 FINAL  Final  Culture, Urine (Do not remove urinary catheter, catheter placed by urology or difficult to place)     Status: Abnormal   Collection Time: 03/26/23  3:53 PM   Specimen: Urine, Catheterized  Result Value Ref Range Status   Specimen Description URINE, CATHETERIZED  Final   Special Requests   Final    NONE Performed at [facility], [address], [place], [place] [address]    Culture 20,000 COLONIES/mL ESCHERICHIA COLI (A)  Final   Report Status 03/28/2023 FINAL  Final   Organism ID, Bacteria ESCHERICHIA COLI (A)  Final      Susceptibility   Escherichia coli - MIC*    AMPICILLIN >=32 RESISTANT Resistant     CEFAZOLIN <=4 SENSITIVE Sensitive     CEFEPIME <=0.12 SENSITIVE Sensitive     CEFTRIAXONE <=0.25 SENSITIVE Sensitive     CIPROFLOXACIN >=4 RESISTANT Resistant     GENTAMICIN >=16 RESISTANT Resistant     IMIPENEM <=0.25 SENSITIVE Sensitive     NITROFURANTOIN <=16 SENSITIVE Sensitive     TRIMETH/SULFA <=20 SENSITIVE Sensitive     AMPICILLIN/SULBACTAM >=32 RESISTANT Resistant     PIP/TAZO <=4 SENSITIVE  Sensitive     * 20,000 COLONIES/mL ESCHERICHIA COLI    Time spent: 25 minutes  Signed: Alberteen Sam, MD 03/27/2023

## 2023-04-10 NOTE — Progress Notes (Signed)
   03/31/2023 1600  Spiritual Encounters  Type of Visit Initial  Care provided to: [facility] partners present during encounter Nurse  Referral source Nurse (RN/NT/LPN)  Reason for visit Patient death  OnCall Visit No  Spiritual Framework  Presenting Themes Impactful experiences and emotions  Values/beliefs family  Interventions  Spiritual Care Interventions Made Reflective listening;Compassionate presence;Established relationship of care and support   Family was referred by the attending nurse. Pt's sons are grieving the loss of their father. Joe and Harvie Heck had a close relationship with their father. Pt's sons engage in storytelling and remembering the life of their father. Ch facilitated life review, provided compassionate presence, and hospitality. No follow-up needed at this time.

## 2023-04-10 DEATH — deceased

## 2024-01-18 ENCOUNTER — Ambulatory Visit: Payer: Medicare PPO | Admitting: Neurology
